# Patient Record
Sex: Male | Born: 1960 | Hispanic: No | Marital: Married | Smoking: Never smoker
Health system: Southern US, Community
[De-identification: ages and names within clinical notes are randomized; demographics above are authoritative.]

## PROBLEM LIST (undated history)

## (undated) DIAGNOSIS — J302 Other seasonal allergic rhinitis: Secondary | ICD-10-CM

## (undated) DIAGNOSIS — E781 Pure hyperglyceridemia: Secondary | ICD-10-CM

## (undated) DIAGNOSIS — M25551 Pain in right hip: Secondary | ICD-10-CM

## (undated) DIAGNOSIS — M545 Low back pain: Secondary | ICD-10-CM

## (undated) DIAGNOSIS — I10 Essential (primary) hypertension: Secondary | ICD-10-CM

## (undated) DIAGNOSIS — G8929 Other chronic pain: Secondary | ICD-10-CM

## (undated) DIAGNOSIS — K219 Gastro-esophageal reflux disease without esophagitis: Secondary | ICD-10-CM

## (undated) HISTORY — DX: Other chronic pain: G89.29

## (undated) HISTORY — DX: Low back pain: M54.5

## (undated) HISTORY — DX: Other seasonal allergic rhinitis: J30.2

## (undated) HISTORY — DX: Pain in right hip: M25.551

## (undated) HISTORY — DX: Essential (primary) hypertension: I10

## (undated) HISTORY — DX: Pure hyperglyceridemia: E78.1

## (undated) HISTORY — PX: ESOPHAGOGASTRODUODENOSCOPY: SHX1529

## (undated) HISTORY — DX: Gastro-esophageal reflux disease without esophagitis: K21.9

## (undated) HISTORY — PX: NO PAST SURGERIES: SHX2092

---

## 1998-11-22 ENCOUNTER — Emergency Department (HOSPITAL_COMMUNITY): Admission: EM | Admit: 1998-11-22 | Discharge: 1998-11-22 | Payer: Self-pay | Admitting: Emergency Medicine

## 1998-11-22 ENCOUNTER — Encounter: Payer: Self-pay | Admitting: Emergency Medicine

## 2017-07-09 ENCOUNTER — Encounter: Payer: Self-pay | Admitting: Physician Assistant

## 2017-07-09 ENCOUNTER — Ambulatory Visit (INDEPENDENT_AMBULATORY_CARE_PROVIDER_SITE_OTHER): Payer: 59 | Admitting: Physician Assistant

## 2017-07-09 ENCOUNTER — Encounter (INDEPENDENT_AMBULATORY_CARE_PROVIDER_SITE_OTHER): Payer: Self-pay

## 2017-07-09 VITALS — BP 139/89 | HR 66 | Ht 66.0 in | Wt 243.0 lb

## 2017-07-09 DIAGNOSIS — Z13220 Encounter for screening for lipoid disorders: Secondary | ICD-10-CM

## 2017-07-09 DIAGNOSIS — Z1159 Encounter for screening for other viral diseases: Secondary | ICD-10-CM | POA: Diagnosis not present

## 2017-07-09 DIAGNOSIS — Z131 Encounter for screening for diabetes mellitus: Secondary | ICD-10-CM | POA: Diagnosis not present

## 2017-07-09 DIAGNOSIS — Z7689 Persons encountering health services in other specified circumstances: Secondary | ICD-10-CM

## 2017-07-09 DIAGNOSIS — Z130 Encounter for screening for diseases of the blood and blood-forming organs and certain disorders involving the immune mechanism: Secondary | ICD-10-CM

## 2017-07-09 DIAGNOSIS — Z23 Encounter for immunization: Secondary | ICD-10-CM

## 2017-07-09 DIAGNOSIS — K219 Gastro-esophageal reflux disease without esophagitis: Secondary | ICD-10-CM

## 2017-07-09 DIAGNOSIS — E66812 Obesity, class 2: Secondary | ICD-10-CM

## 2017-07-09 DIAGNOSIS — E6609 Other obesity due to excess calories: Secondary | ICD-10-CM

## 2017-07-09 DIAGNOSIS — I10 Essential (primary) hypertension: Secondary | ICD-10-CM | POA: Diagnosis not present

## 2017-07-09 MED ORDER — FAMOTIDINE 20 MG PO TABS: 20.0000 mg | ORAL_TABLET | Freq: Two times a day (BID) | ORAL | 1 refills | Status: DC | PRN

## 2017-07-09 MED ORDER — ASPIRIN EC 81 MG PO TBEC: 81.0000 mg | DELAYED_RELEASE_TABLET | Freq: Every day | ORAL | 3 refills | Status: AC
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
## 2017-07-09 NOTE — Patient Instructions (Addendum)
For your blood pressure: - Goal <130/80 - baby aspirin 81 mg daily to help prevent heart attack/stroke - monitor and log blood pressures at home - check around the same time each day in a relaxed setting - Limit salt to <2000 mg/day - Follow DASH eating plan - limit alcohol to 2 standard drinks per day for men and 1 per day for women - avoid tobacco products - weight loss: 7% of current body weight - follow-up every 6 months for your blood pressure   Physical Activity Recommendations for modifying lipids and lowering blood pressure Engage in aerobic physical activity to reduce LDL-cholesterol, non-HDL-cholesterol, and blood pressure  Frequency: 3-4 sessions per week  Intensity: moderate to vigorous  Duration: 40 minutes on average  Physical Activity Recommendations for secondary prevention 1. Aerobic exercise  Frequency: 3-5 sessions per week  Intensity: 50-80% capacity  Duration: 20 - 60 minutes  Examples: walking, treadmill, cycling, rowing, stair climbing, and arm/leg ergometry  2. Resistance exercise  Frequency: 2-3 sessions per week  Intensity: 10-15 repetitions/set to moderate fatigue  Duration: 1-3 sets of 8-10 upper and lower body exercises  Examples: calisthenics, elastic bands, cuff/hand weights, dumbbels, free weights, wall pulleys, and weight machines  Heart-Healthy Lifestyle  Eating a diet rich in vegetables, fruits and whole grains: also includes low-fat dairy products, poultry, fish, legumes, and nuts; limit intake of sweets, sugar-sweetened beverages and red meats  Getting regular exercise  Maintaining a healthy weight  Not smoking or getting help quitting  Staying on top of your health; for some people, lifestyle changes alone may not be enough to prevent a heart attack or stroke. In these cases, taking a statin at the right dose will most likely be necessary   Food Choices for Gastroesophageal Reflux Disease, Adult When you have  gastroesophageal reflux disease (GERD), the foods you eat and your eating habits are very important. Choosing the right foods can help ease the discomfort of GERD. Consider working with a diet and nutrition specialist (dietitian) to help you make healthy food choices. What general guidelines should I follow? Eating plan  Choose healthy foods low in fat, such as fruits, vegetables, whole grains, low-fat dairy products, and lean meat, fish, and poultry.  Eat frequent, small meals instead of three large meals each day. Eat your meals slowly, in a relaxed setting. Avoid bending over or lying down until 2-3 hours after eating.  Limit high-fat foods such as fatty meats or fried foods.  Limit your intake of oils, butter, and shortening to less than 8 teaspoons each day.  Avoid the following: ? Foods that cause symptoms. These may be different for different people. Keep a food diary to keep track of foods that cause symptoms. ? Alcohol. ? Drinking large amounts of liquid with meals. ? Eating meals during the 2-3 hours before bed.  Cook foods using methods other than frying. This may include baking, grilling, or broiling. Lifestyle   Maintain a healthy weight. Ask your health care provider what weight is healthy for you. If you need to lose weight, work with your health care provider to do so safely.  Exercise for at least 30 minutes on 5 or more days each week, or as told by your health care provider.  Avoid wearing clothes that fit tightly around your waist and chest.  Do not use any products that contain nicotine or tobacco, such as cigarettes and e-cigarettes. If you need help quitting, ask your health care provider.  Sleep with the head  of your bed raised. Use a wedge under the mattress or blocks under the bed frame to raise the head of the bed. What foods are not recommended? The items listed may not be a complete list. Talk with your dietitian about what dietary choices are best for  you. Grains Pastries or quick breads with added fat. [place] toast. Vegetables Deep fried vegetables. [place] fries. Any vegetables prepared with added fat. Any vegetables that cause symptoms. For some people this may include tomatoes and tomato products, chili peppers, onions and garlic, and horseradish. Fruits Any fruits prepared with added fat. Any fruits that cause symptoms. For some people this may include citrus fruits, such as oranges, grapefruit, pineapple, and lemons. Meats and other protein foods High-fat meats, such as fatty beef or pork, hot dogs, ribs, ham, sausage, salami and bacon. Fried meat or protein, including fried fish and fried chicken. Nuts and nut butters. Dairy Whole milk and chocolate milk. Sour cream. Cream. Ice cream. Cream cheese. Milk shakes. Beverages Coffee and tea, with or without caffeine. Carbonated beverages. Sodas. Energy drinks. Fruit juice made with acidic fruits (such as orange or grapefruit). Tomato juice. Alcoholic drinks. Fats and oils Butter. Margarine. Shortening. Ghee. Sweets and desserts Chocolate and cocoa. Donuts. Seasoning and other foods Pepper. Peppermint and spearmint. Any condiments, herbs, or seasonings that cause symptoms. For some people, this may include curry, hot sauce, or vinegar-based salad dressings. Summary  When you have gastroesophageal reflux disease (GERD), food and lifestyle choices are very important to help ease the discomfort of GERD.  Eat frequent, small meals instead of three large meals each day. Eat your meals slowly, in a relaxed setting. Avoid bending over or lying down until 2-3 hours after eating.  Limit high-fat foods such as fatty meat or fried foods. This information is not intended to replace advice given to you by your health care provider. Make sure you discuss any questions you have with your health care provider. Document Released: 05/12/2005 Document Revised: 05/13/2016 Document Reviewed:  05/13/2016 Elsevier Interactive Patient Education  [employer]2018 [employer]

## 2017-07-10 LAB — COMPREHENSIVE METABOLIC PANEL
AG Ratio: 2 (calc) (ref 1.0–2.5)
ALT: 32 U/L (ref 9–46)
AST: 19 U/L (ref 10–35)
Albumin: 4.8 g/dL (ref 3.6–5.1)
Alkaline phosphatase (APISO): 65 U/L (ref 40–115)
BUN: 18 mg/dL (ref 7–25)
CO2: 26 mmol/L (ref 20–32)
Calcium: 9.5 mg/dL (ref 8.6–10.3)
Chloride: 106 mmol/L (ref 98–110)
Creat: 1.31 mg/dL (ref 0.70–1.33)
Globulin: 2.4 g/dL (calc) (ref 1.9–3.7)
Glucose, Bld: 100 mg/dL — ABNORMAL HIGH (ref 65–99)
Potassium: 4.5 mmol/L (ref 3.5–5.3)
Sodium: 136 mmol/L (ref 135–146)
Total Bilirubin: 0.7 mg/dL (ref 0.2–1.2)
Total Protein: 7.2 g/dL (ref 6.1–8.1)

## 2017-07-10 LAB — CBC
HCT: 44.9 % (ref 38.5–50.0)
Hemoglobin: 15.4 g/dL (ref 13.2–17.1)
MCH: 30 pg (ref 27.0–33.0)
MCHC: 34.3 g/dL (ref 32.0–36.0)
MCV: 87.4 fL (ref 80.0–100.0)
MPV: 10.7 fL (ref 7.5–12.5)
Platelets: 226 10*3/uL (ref 140–400)
RBC: 5.14 10*6/uL (ref 4.20–5.80)
RDW: 12.8 % (ref 11.0–15.0)
WBC: 4.3 10*3/uL (ref 3.8–10.8)

## 2017-07-10 LAB — LIPID PANEL W/REFLEX DIRECT LDL
Cholesterol: 171 mg/dL (ref <200)
HDL: 36 mg/dL — ABNORMAL LOW (ref >40)
LDL Cholesterol (Calc): 107 mg/dL (calc) — ABNORMAL HIGH
Non-HDL Cholesterol (Calc): 135 mg/dL (calc) — ABNORMAL HIGH (ref <130)
Total CHOL/HDL Ratio: 4.8 (calc) (ref <5.0)
Triglycerides: 160 mg/dL — ABNORMAL HIGH (ref <150)

## 2017-07-10 LAB — HEPATITIS C ANTIBODY
Hepatitis C Ab: NONREACTIVE
SIGNAL TO CUT-OFF: 0.02 (ref <1.00)

## 2017-07-10 NOTE — Progress Notes (Signed)
Your labs look good - hepatitis c antibody is negative - glucose is on the high end of normal. Recommend follow-up A1c to rule out prediabetes - normal kidney function - cholesterol in a healthy range - normal blood counts

## 2017-08-06 ENCOUNTER — Encounter: Payer: Self-pay | Admitting: Physician Assistant

## 2017-08-06 ENCOUNTER — Ambulatory Visit (INDEPENDENT_AMBULATORY_CARE_PROVIDER_SITE_OTHER): Payer: 59

## 2017-08-06 ENCOUNTER — Ambulatory Visit (INDEPENDENT_AMBULATORY_CARE_PROVIDER_SITE_OTHER): Payer: 59 | Admitting: Physician Assistant

## 2017-08-06 VITALS — BP 141/82 | HR 60 | Wt 243.0 lb

## 2017-08-06 DIAGNOSIS — M25551 Pain in right hip: Secondary | ICD-10-CM | POA: Diagnosis not present

## 2017-08-06 DIAGNOSIS — M2578 Osteophyte, vertebrae: Secondary | ICD-10-CM | POA: Diagnosis not present

## 2017-08-06 DIAGNOSIS — Z0000 Encounter for general adult medical examination without abnormal findings: Secondary | ICD-10-CM

## 2017-08-06 DIAGNOSIS — R7301 Impaired fasting glucose: Secondary | ICD-10-CM | POA: Insufficient documentation

## 2017-08-06 DIAGNOSIS — I10 Essential (primary) hypertension: Secondary | ICD-10-CM | POA: Diagnosis not present

## 2017-08-06 DIAGNOSIS — M1611 Unilateral primary osteoarthritis, right hip: Secondary | ICD-10-CM | POA: Insufficient documentation

## 2017-08-06 DIAGNOSIS — M479 Spondylosis, unspecified: Secondary | ICD-10-CM | POA: Insufficient documentation

## 2017-08-06 DIAGNOSIS — G8929 Other chronic pain: Secondary | ICD-10-CM

## 2017-08-06 DIAGNOSIS — M545 Low back pain, unspecified: Secondary | ICD-10-CM

## 2017-08-06 DIAGNOSIS — N200 Calculus of kidney: Secondary | ICD-10-CM | POA: Diagnosis not present

## 2017-08-06 DIAGNOSIS — E781 Pure hyperglyceridemia: Secondary | ICD-10-CM

## 2017-08-06 DIAGNOSIS — M47815 Spondylosis without myelopathy or radiculopathy, thoracolumbar region: Secondary | ICD-10-CM | POA: Insufficient documentation

## 2017-08-06 HISTORY — DX: Other chronic pain: G89.29

## 2017-08-06 HISTORY — DX: Pure hyperglyceridemia: E78.1

## 2017-08-06 HISTORY — DX: Low back pain, unspecified: M54.50

## 2017-08-06 MED ORDER — ACETAMINOPHEN ER 650 MG PO TBCR: 650.0000 mg | EXTENDED_RELEASE_TABLET | Freq: Three times a day (TID) | ORAL | 3 refills | Status: DC | PRN

## 2017-08-06 NOTE — Progress Notes (Signed)
Nadara EatonHi Abdoul,  Your x-rays look good. 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  Treatment plan does not change.  Thanks for coming in today! Vinetta Bergamoharley

## 2017-08-06 NOTE — Patient Instructions (Addendum)
For your blood pressure: - Goal <130/80 - baby aspirin 81 mg daily to help prevent heart attack/stroke - monitor and log blood pressures at home - check around the same time each day in a relaxed setting - Limit salt to <2000 mg/day - Follow DASH eating plan - limit alcohol to 2 standard drinks per day for men and 1 per day for women - avoid tobacco products - weight loss: 7% of current body weight - follow-up every 6 months for your blood pressure   For weight loss: - 1800 calorie diet for aggressive weight loss, 2000 calorie diet for less aggressive - MEASURE YOUR CALORIES (If you don't measure, you don't know if you are over-eating) - 100 grams of protein - limit carbs to 45 grams per meal, 20 g per snack - eating plans that are good options for people with high blood pressure are DASH, Mediterranean, and ADA (diabetes) - drink 11 glasses of water per day - limit caffeine to 1 cup per day - watch your sugar!  - no soda, sweet tea, juice, energy drinks, gatorade, or sweetened beverages  Physical Activity Recommendations for modifying lipids and lowering blood pressure Engage in aerobic physical activity to reduce LDL-cholesterol, non-HDL-cholesterol, and blood pressure  Frequency: 3-5 sessions per week  Intensity: moderate to vigorous  Duration: 40 minutes on average   1. Aerobic exercise  Frequency: 3-5 sessions per week  Intensity: 50-80% capacity  Duration: 20 - 60 minutes  Examples: walking, treadmill, cycling, rowing, stair climbing, and arm/leg ergometry  2. Resistance exercise  Frequency: 2-3 sessions per week  Intensity: 10-15 repetitions/set to moderate fatigue  Duration: 1-3 sets of 8-10 upper and lower body exercises  Examples: calisthenics, elastic bands, cuff/hand weights, dumbbels, free weights, wall pulleys, and weight machines  Heart-Healthy Lifestyle  Eating a diet rich in vegetables, fruits and whole grains: also includes low-fat dairy  products, poultry, fish, legumes, and nuts; limit intake of sweets, sugar-sweetened beverages and red meats  Getting regular exercise  Maintaining a healthy weight  Not smoking or getting help quitting  Staying on top of your health; for some people, lifestyle changes alone may not be enough to prevent a heart attack or stroke. In these cases, taking a statin at the right dose will most likely be necessary
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
## 2017-08-20 ENCOUNTER — Encounter: Payer: Self-pay | Admitting: Physical Therapy

## 2017-08-20 ENCOUNTER — Ambulatory Visit: Payer: 59 | Admitting: Physical Therapy

## 2017-08-20 DIAGNOSIS — M25551 Pain in right hip: Secondary | ICD-10-CM

## 2017-08-20 DIAGNOSIS — M6281 Muscle weakness (generalized): Secondary | ICD-10-CM | POA: Diagnosis not present

## 2017-08-20 DIAGNOSIS — M62830 Muscle spasm of back: Secondary | ICD-10-CM

## 2017-08-20 DIAGNOSIS — G8929 Other chronic pain: Secondary | ICD-10-CM

## 2017-08-20 DIAGNOSIS — M5441 Lumbago with sciatica, right side: Secondary | ICD-10-CM | POA: Diagnosis not present

## 2017-08-20 NOTE — Patient Instructions (Addendum)
Pelvic Press    Place hands under belly between navel and pubic bone, palms up. Feel pressure on hands. Increase pressure on hands by pressing pelvis down. This is NOT a pelvic tilt. Hold __5_ seconds. Relax. Repeat _10__ times. Perform once a day.   PRONE KNEE BEND:    Lie on stomach. Perform pelvic press, bend both knees. Do _2__ sets of _10__ repetitions per session. Do __1_ sessions per day.  Leg Lift: One-Leg    Press pelvis down. Keep knee straight; lengthen and lift one leg (from waist). Do not twist body. Keep other leg down. Hold _1__ seconds. Relax. Repeat 10 times. Repeat with other leg. Once a day.

## 2017-08-20 NOTE — Therapy (Signed)
[facility] [place] [address] [place], [place], [address] Phone: [phone]   Fax:  [phone]  Physical Therapy Evaluation  Patient Details  Name: Adrian [name] MRN: [id number] Date of Birth: 15-Mar-1961 Referring Provider: Gena Fray PA   Encounter Date: 08/20/2017  PT End of Session - 08/20/17 1017    Visit Number  1    Number of Visits  8    Date for PT Re-Evaluation  09/17/17    PT Start Time  1017    PT Stop Time  1112    PT Time Calculation (min)  55 min    Activity Tolerance  Patient tolerated treatment well       Past Medical History:  Diagnosis Date  . Chronic right hip pain 08/06/2017  . Chronic right-sided low back pain without sciatica 08/06/2017  . GERD (gastroesophageal reflux disease)   . Hypertension   . Hypertriglyceridemia 08/06/2017  . Seasonal allergies     Past Surgical History:  Procedure Laterality Date  . ESOPHAGOGASTRODUODENOSCOPY    . NO PAST SURGERIES      There were no vitals filed for this visit.   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 This cleared up.  In 2013 he [place] with his sons. Within the first week he bagan developing Rt hip pain ( was carrying 40# pack) that progressed into the groin. The pain is intermittent since this especailly with strenous activity. Currently not in an active flare up however had slight pain after pushing lawn mover and working in yard    How long can you sit comfortably?  no limitations     Diagnostic tests  x-rays (-)     Patient Stated Goals  not sure - hoping to get where he doesn't have a back issue     Currently in Pain?  Yes    Pain Score  2     Pain Location  Back    Pain Orientation  Right    Pain Descriptors / Indicators  Aching;Dull    Pain Type   Chronic pain    Pain Onset  In the past 7 days    Pain Frequency  Intermittent    Aggravating Factors   strenous activity    Pain Relieving Factors  rest, OTC meds         [facility] PT Assessment - 08/20/17 0001      Assessment   Medical Diagnosis  LBP & Rt hip pain    Referring Provider  Gena Fray PA    Onset Date/Surgical Date  07/23/17    Hand Dominance  Right    Next MD Visit  11/07/17    Prior Therapy  none      Precautions   Precautions  None      Balance Screen   Has the patient fallen in the past 6 months  No      Home Environment   Living Environment  Private residence    Living Arrangements  Spouse/significant other    Home Access  Stairs to enter      Prior Function   Level of Independence  Independent    Vocation  Retired Emergency planning/management officer    Leisure  yard work, hiking, wants to ride his bike  Observation/Other Assessments   Focus on Therapeutic Outcomes (FOTO)   39% limited      Functional Tests   Functional tests  Squat;Single leg stance      Squat   Comments  WNL some bilat knee pain      Single Leg Stance   Comments  WNL      Posture/Postural Control   Posture/Postural Control  Postural limitations    Postural Limitations  Increased lumbar lordosis obesity      ROM / Strength   AROM / PROM / Strength  AROM;Strength      AROM   AROM Assessment Site  Lumbar;Hip    Right/Left Hip  -- WNL    Lumbar Flexion  to floor, Rt low back slightly higher than left    Lumbar Extension  WNL with Rt sided LBP    Lumbar - Right Rotation  WNL pain at endrange    Lumbar - Left Rotation  WNL      Strength   Strength Assessment Site  Hip;Knee;Ankle;Lumbar    Right/Left Hip  Right;Left    Right Hip Flexion  4+/5 slight pain    Right Hip Extension  5/5    Right Hip ABduction  4+/5    Left Hip Flexion  -- 5-/5    Left Hip Extension  4+/5    Left Hip ABduction  4+/5    Right/Left Knee  -- bilat WNL    Right/Left Ankle  -- bilat WNL    Lumbar Flexion   -- TA poor    Lumbar Extension  -- multifidi poor      Flexibility   Soft Tissue Assessment /Muscle Length  yes    Hamstrings  supine SLR Lt WNL, Rt WNL    Quadriceps  prone knee bend WNL bilat      Palpation   Spinal mobility  hypomobile in lumbar spine, pain with Rt UPA L4-2    Palpation comment  pain and tight in Rt gluts/piriformis, pain in lumbar paraspinals Rt,slight tenderness in Lt gluts      Special Tests   Other special tests  (-) lumbar and SIJ special tests              No data recorded  Objective measurements completed on examination: See above findings.      [facility] Adult PT Treatment/Exercise - 08/20/17 0001      Self-Care   Self-Care  Other Self-Care Comments    Other Self-Care Comments   TRP using a ball against a wall for low back and Rt buttocks.       Exercises   Exercises  Lumbar      Lumbar Exercises: Supine   Other Supine Lumbar Exercises  10 reps pilates table tops with VC for form      Lumbar Exercises: Prone   Other Prone Lumbar Exercises  10 reps pelvic press, press with knee flex/ext, press with hip ext. , difficulty performing press             PT Education - 08/20/17 1113    Education provided  Yes    Education Details  HEP    Person(s) Educated  Patient    Methods  Demonstration;Explanation;Handout    Comprehension  Verbal cues required;Returned demonstration;Verbalized understanding          PT Long Term Goals - 08/20/17 1117      PT LONG TERM GOAL #1   Title  I with advanced HEP and demo safe body mechanics  while simulated yard work ( 09/17/17)     Time  4    Period  Weeks    Status  New      PT LONG TERM GOAL #2   Title  improve bilat hip strength =/> 5-/5 to support body for hiking trip ( 09/17/17)     Time  4    Period  Weeks    Status  New      PT LONG TERM GOAL #3   Title  improve FOTO =/< 25% limited  ( 09/17/17)     Time  4    Period  Weeks    Status  New      PT LONG TERM GOAL #4   Title   tolerate moderate level core work without increased in back pain and good form ( 09/17/17)    Time  4    Period  Weeks    Status  New      PT LONG TERM GOAL #5   Title  report no more than 1/10 back pain with daily activities ( 09/17/17)     Time  4    Period  Weeks    Status  New             Plan - 08/20/17 1113    Clinical Impression Statement  57 yo male with intermittent LBP that moves into the Rt groin.  This flared up the other week after performing yard work.  He has tightness and tenderness into the Rt low back and buttocks, weakness in his core and hips, impaired body mechanics and pain that is interfering with function.  He plans on hiking 10 miles in the next couple months and doesn't want his pain to limit this.     Clinical Presentation  Stable    Clinical Decision Making  Low    Rehab Potential  Excellent    PT Frequency  2x / week    PT Duration  4 weeks    PT Treatment/Interventions  Moist Heat;Manual techniques;Dry needling;Taping;Patient/family education;Therapeutic activities;Ultrasound;Traction;Therapeutic exercise;Cryotherapy;Electrical Stimulation;Passive range of motion    PT Next Visit Plan  core stabilization, manual work to Rt low back and buttocks, possible DN and modalities PRN, instruct in body mechanics for yard work.     Consulted and Agree with Plan of Care  Patient       Patient will benefit from skilled therapeutic intervention in order to improve the following deficits and impairments:  Pain, Improper body mechanics, Increased muscle spasms, Hypomobility, Decreased strength, Obesity  Visit Diagnosis: Chronic right-sided low back pain with right-sided sciatica - Plan: PT plan of care cert/re-cert  Pain in right hip - Plan: PT plan of care cert/re-cert  Muscle weakness (generalized) - Plan: PT plan of care cert/re-cert  Muscle spasm of back - Plan: PT plan of care cert/re-cert     Problem List Patient Active Problem List   Diagnosis Date  Noted  . Fasting hyperglycemia 08/06/2017  . Chronic right hip pain 08/06/2017  . Chronic right-sided low back pain without sciatica 08/06/2017  . Hypertriglyceridemia 08/06/2017  . 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  . Gastroesophageal reflux disease without esophagitis 07/09/2017  . Hypertension goal BP (blood pressure) < 130/80 07/09/2017  . Class 2 obesity due to excess calories in adult 07/09/2017    Roderic ScarceSusan Timoty Bourke PT  08/20/2017, 11:21 AM  [facility] [place] [facility] [facility] [address] [place], [place], [address] Phone: [phone]   Fax:  [phone]  Name:  Adrian [name] MRN: [id number] Date of Birth: [birth year]

## 2017-08-24 ENCOUNTER — Ambulatory Visit: Payer: 59 | Admitting: Physical Therapy

## 2017-08-24 DIAGNOSIS — M6281 Muscle weakness (generalized): Secondary | ICD-10-CM

## 2017-08-24 DIAGNOSIS — M5441 Lumbago with sciatica, right side: Secondary | ICD-10-CM | POA: Diagnosis not present

## 2017-08-24 DIAGNOSIS — M25551 Pain in right hip: Secondary | ICD-10-CM

## 2017-08-24 DIAGNOSIS — G8929 Other chronic pain: Secondary | ICD-10-CM

## 2017-08-24 DIAGNOSIS — M62830 Muscle spasm of back: Secondary | ICD-10-CM

## 2017-08-24 NOTE — Patient Instructions (Addendum)
Sleeping on Back  Place pillow under knees. A pillow with cervical support and a roll around waist are also helpful. Copyright  VHI. All rights reserved.  Sleeping on Side Place pillow between knees. Use cervical support under neck and a roll around waist as needed. Copyright  VHI. All rights reserved.   Sleeping on Stomach   If this is the only desirable sleeping position, place pillow under lower legs, and under stomach or chest as needed.  Posture - Sitting   Sit upright, head facing forward. Try using a roll to support lower back. Keep shoulders relaxed, and avoid rounded back. Keep hips level with knees. Avoid crossing legs for long periods. Stand to Sit / Sit to Stand   To sit: Bend knees to lower self onto front edge of chair, then scoot back on seat. To stand: Reverse sequence by placing one foot forward, and scoot to front of seat. Use rocking motion to stand up.   Work Height and Reach  Ideal work height is no more than 2 to 4 inches below elbow level when standing, and at elbow level when sitting. Reaching should be limited to arm's length, with elbows slightly bent.  Bending  Bend at hips and knees, not back. Keep feet shoulder-width apart.    Posture - Standing   Good posture is important. Avoid slouching and forward head thrust. Maintain curve in low back and align ears over shoul- ders, hips over ankles.  Alternating Positions   Alternate tasks and change positions frequently to reduce fatigue and muscle tension. Take rest breaks. Computer Work   Position work to face forward. Use proper work and seat height. Keep shoulders back and down, wrists straight, and elbows at right angles. Use chair that provides full back support. Add footrest and lumbar roll as needed.  Getting Into / Out of Car  Lower self onto seat, scoot back, then bring in one leg at a time. Reverse sequence to get out.  Dressing  Lie on back to pull socks or slacks over feet, or sit  and bend leg while keeping back straight.    Housework - Sink  Place one foot on ledge of cabinet under sink when standing at sink for prolonged periods.   Pushing / Pulling  Pushing is preferable to pulling. Keep back in proper alignment, and use leg muscles to do the work.  Deep Squat   Squat and lift with both arms held against upper trunk. Tighten stomach muscles without holding breath. Use smooth movements to avoid jerking.  Avoid Twisting   Avoid twisting or bending back. Pivot around using foot movements, and bend at knees if needed when reaching for articles.  Carrying Luggage   Distribute weight evenly on both sides. Use a cart whenever possible. Do not twist trunk. Move body as a unit.   Lifting Principles .Maintain proper posture and head alignment. .Slide object as close as possible before lifting. .Move obstacles out of the way. .Test before lifting; ask for help if too heavy. .Tighten stomach muscles without holding breath. .Use smooth movements; do not jerk. .Use legs to do the work, and pivot with feet. .Distribute the work load symmetrically and close to the center of trunk. .Push instead of pull whenever possible.   Ask For Help   Ask for help and delegate to others when possible. Coordinate your movements when lifting together, and maintain the low back curve.  Log Roll   Lying on back, bend left knee and place left   arm across chest. Roll all in one movement to the right. Reverse to roll to the left. Always move as one unit. Housework - Sweeping  Use long-handled equipment to avoid stooping.   Housework - Wiping  Position yourself as close as possible to reach work surface. Avoid straining your back.  Laundry - Unloading Wash   To unload small items at bottom of washer, lift leg opposite to arm being used to reach.  [place] close to area to be raked. Use arm movements to do the work. Keep back straight and avoid  twisting.     Cart  When reaching into cart with one arm, lift opposite leg to keep back straight.   Getting Into / Out of Bed  Lower self to lie down on one side by raising legs and lowering head at the same time. Use arms to assist moving without twisting. Bend both knees to roll onto back if desired. To sit up, start from lying on side, and use same move-ments in reverse. Housework - Vacuuming  Hold the vacuum with arm held at side. Step back and forth to move it, keeping head up. Avoid twisting.   Laundry - IT consultant so that bending and twisting can be avoided.   Laundry - Unloading Dryer  Squat down to reach into clothes dryer or use a reacher.  Gardening - Weeding / Probation officer or Kneel. Knee pads may be helpful.                   TENS stands for Transcutaneous Electrical Nerve Stimulation. In other words, electrical impulses are allowed to pass through the skin in order to excite a nerve.   Purpose and Use of TENS:  TENS is a method used to manage acute and chronic pain without the use of drugs. It has been effective in managing pain associated with surgery, sprains, strains, trauma, rheumatoid arthritis, and neuralgias. It is a non-addictive, low risk, and non-invasive technique used to control pain. It is not, by any means, a curative form of treatment.   How TENS Works:  Most TENS units are a Paramedic unit powered by one 9 volt battery. Attached to the outside of the unit are two lead wires where two pins and/or snaps connect on each wire. All units come with a set of four reusable pads or electrodes. These are placed on the skin surrounding the area involved. By inserting the leads into  the pads, the electricity can pass from the unit making the circuit complete.  As the intensity is turned up slowly, the electrical current enters the body from the electrodes through the skin to the surrounding nerve fibers. This  triggers the release of hormones from within the body. These hormones contain pain relievers. By increasing the circulation of these hormones, the person's pain may be lessened. It is also believed that the electrical stimulation itself helps to block the pain messages being sent to the brain, thus also decreasing the body's perception of pain.   Hazards:  TENS units are NOT to be used by patients with PACEMAKERS, DEFIBRILLATORS, DIABETIC PUMPS, PREGNANT WOMEN, and patients with SEIZURE DISORDERS.  TENS units are NOT to be used over the heart, throat, brain, or spinal cord.  One of the major side effects from the TENS unit may be skin irritation. Some people may develop a rash if they are sensitive to the materials used in the electrodes or the  connecting wires.    Avoid overuse due the body getting used to the stem making it not as effective over time.    [facility] [address] [place], [place] [address]  [phone] (office) [phone] (fax)

## 2017-08-24 NOTE — Therapy (Signed)
[facility] [address] [place] [address] [place], [place], [address] Phone: [phone]   Fax:  [phone]  Physical Therapy Treatment  Patient Details  Name: Adrian [name] MRN: [id number] Date of Birth: 14-Jun-1960 Referring Provider: Gena Frayharley Cummings, PA-C   Encounter Date: 08/24/2017  PT End of Session - 08/24/17 0939    Visit Number  2    Number of Visits  8    Date for PT Re-Evaluation  09/17/17    PT Start Time  0936    PT Stop Time  1034    PT Time Calculation (min)  58 min       Past Medical History:  Diagnosis Date  . Chronic right hip pain 08/06/2017  . Chronic right-sided low back pain without sciatica 08/06/2017  . GERD (gastroesophageal reflux disease)   . Hypertension   . Hypertriglyceridemia 08/06/2017  . Seasonal allergies     Past Surgical History:  Procedure Laterality Date  . ESOPHAGOGASTRODUODENOSCOPY    . NO PAST SURGERIES      There were no vitals filed for this visit.  Subjective Assessment - 08/24/17 0939    Subjective  Pt reports he had difficulty finding the right size ball for self massage and he is wondering if he is doing his exercises correctly.     Currently in Pain?  Yes    Pain Score  1     Pain Location  Back    Pain Orientation  Right;Lower    Aggravating Factors   strenous activity; bending    Pain Relieving Factors  rest, OTC meds          [facility] PT Assessment - 08/24/17 0001      Assessment   Medical Diagnosis  LBP & Rt hip pain    Referring Provider  Gena Frayharley Cummings, PA-C    Onset Date/Surgical Date  07/23/17    Hand Dominance  Right    Next MD Visit  11/07/17      Palpation   SI assessment   Rt ilium higher than Lt; Rt ASIS lower than Lt;     Palpation comment  point tender in Rt iliopsoas in supine        [facility] Adult PT Treatment/Exercise - 08/24/17 0001      Lumbar Exercises: Aerobic   Nustep  L5: 7 min  PTA present to discuss progress      Lumbar Exercises:  Seated   Other Seated Lumbar Exercises  pt educated on sit to/from supine via log roll; pt returned demo with VC.        Lumbar Exercises: Supine   Other Supine Lumbar Exercises  10 reps pilates single leg table tops, 5 sec hold in flexion, with VC for form       Lumbar Exercises: Prone   Other Prone Lumbar Exercises  10 reps pelvic press, press with knee flex/ext x 10 reps, then bilat knee bend x 10 reps       Modalities   Modalities  Electrical Stimulation;Moist Heat      Moist Heat Therapy   Number Minutes Moist Heat  15 Minutes    Moist Heat Location  Lumbar Spine;Hip      Electrical Stimulation   Electrical Stimulation Location  Rt hip and low back     Electrical Stimulation Action  IFC    Electrical Stimulation Parameters   to tolerance    Electrical Stimulation Goals  Pain;Tone      Manual Therapy  Manual Therapy  Soft tissue mobilization    Manual therapy comments  pt in prone; guarded.     Soft tissue mobilization  STM to Rt glute and hip rotators (with and without PROM into ER/IR of LE)             PT Education - 08/24/17 1022    Education provided  Yes    Education Details  posture and body mechanics, TENS    Person(s) Educated  Patient    Methods  Handout;Explanation    Comprehension  Verbalized understanding          PT Long Term Goals - 08/20/17 1117      PT LONG TERM GOAL #1   Title  I with advanced HEP and demo safe body mechanics while simulated yard work ( 09/17/17)     Time  4    Period  Weeks    Status  New      PT LONG TERM GOAL #2   Title  improve bilat hip strength =/> 5-/5 to support body for hiking trip ( 09/17/17)     Time  4    Period  Weeks    Status  New      PT LONG TERM GOAL #3   Title  improve FOTO =/< 25% limited  ( 09/17/17)     Time  4    Period  Weeks    Status  New      PT LONG TERM GOAL #4   Title  tolerate moderate level core work without increased in back pain and good form ( 09/17/17)    Time  4    Period   Weeks    Status  New      PT LONG TERM GOAL #5   Title  report no more than 1/10 back pain with daily activities ( 09/17/17)     Time  4    Period  Weeks    Status  New            Plan - 08/24/17 1000    Clinical Impression Statement  Pt required some cues for form and breathe work with exercises.  Pt given beginning education on body mechanics and log roll for neutral spine. Improved ability to complete pelvic press with minimal cues.  Pt presents with pelvis asymmetries; will benefit from further evaluation of this in future.  Pt reported reduction of pain after estim/MHP.      Rehab Potential  Excellent    PT Frequency  2x / week    PT Duration  4 weeks    PT Treatment/Interventions  Moist Heat;Manual techniques;Dry needling;Taping;Patient/family education;Therapeutic activities;Ultrasound;Traction;Therapeutic exercise;Cryotherapy;Electrical Stimulation;Passive range of motion    PT Next Visit Plan  core stabilization, manual work to Rt low back and buttocks, possible DN and modalities PRN, instruct in body mechanics for yard work.     Consulted and Agree with Plan of Care  Patient       Patient will benefit from skilled therapeutic intervention in order to improve the following deficits and impairments:  Pain, Improper body mechanics, Increased muscle spasms, Hypomobility, Decreased strength, Obesity  Visit Diagnosis: Chronic right-sided low back pain with right-sided sciatica  Pain in right hip  Muscle weakness (generalized)  Muscle spasm of back     Problem List Patient Active Problem List   Diagnosis Date Noted  . Fasting hyperglycemia 08/06/2017  . Chronic right hip pain 08/06/2017  . Chronic right-sided low back pain without sciatica  08/06/2017  . Hypertriglyceridemia 08/06/2017  . 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  . Gastroesophageal reflux disease without esophagitis 07/09/2017  . Hypertension goal BP (blood pressure) < 130/80 07/09/2017  . Class 2 obesity  due to excess calories in adult 07/09/2017   Mayer Camel, PTA 08/24/17 1:34 PM  [facility] [place] [address] [place], [place], [address] Phone: [phone]   Fax:  [phone]  Name: Adrian [name] MRN: [id number] Date of Birth: 1960-06-28

## 2017-08-26 ENCOUNTER — Encounter: Payer: Self-pay | Admitting: Physical Therapy

## 2017-08-26 ENCOUNTER — Ambulatory Visit: Payer: 59 | Admitting: Physical Therapy

## 2017-08-26 DIAGNOSIS — M6281 Muscle weakness (generalized): Secondary | ICD-10-CM | POA: Diagnosis not present

## 2017-08-26 DIAGNOSIS — M5441 Lumbago with sciatica, right side: Secondary | ICD-10-CM

## 2017-08-26 DIAGNOSIS — G8929 Other chronic pain: Secondary | ICD-10-CM

## 2017-08-26 DIAGNOSIS — M25551 Pain in right hip: Secondary | ICD-10-CM

## 2017-08-26 DIAGNOSIS — M62830 Muscle spasm of back: Secondary | ICD-10-CM | POA: Diagnosis not present

## 2017-08-26 NOTE — Patient Instructions (Signed)

## 2017-08-26 NOTE — Therapy (Signed)
[facility] [address] [place], [place], [address] Phone: [phone]   Fax:  [phone]  Physical Therapy Treatment  Patient Details  Name: Adrian [name] MRN: [id number] Date of Birth: Oct 26, 1960 Referring Provider: Gena Fray, PA-C   Encounter Date: 08/26/2017  PT End of Session - 08/26/17 1434    Visit Number  3    Number of Visits  8    Date for PT Re-Evaluation  09/17/17    PT Start Time  1435    PT Stop Time  1535    PT Time Calculation (min)  60 min    Activity Tolerance  Patient tolerated treatment well       Past Medical History:  Diagnosis Date  . Chronic right hip pain 08/06/2017  . Chronic right-sided low back pain without sciatica 08/06/2017  . GERD (gastroesophageal reflux disease)   . Hypertension   . Hypertriglyceridemia 08/06/2017  . Seasonal allergies     Past Surgical History:  Procedure Laterality Date  . ESOPHAGOGASTRODUODENOSCOPY    . NO PAST SURGERIES      There were no vitals filed for this visit.  Subjective Assessment - 08/26/17 1435    Subjective  Pt reports that he is doing better with his HEP and found a couple balls to use. He thinks the knot is smaller if not gone or moved.  He feels like the pain moved into the center in the spine.     Currently in Pain?  Yes    Pain Score  2     Pain Location  Back    Pain Orientation  Right;Lower    Pain Descriptors / Indicators  Dull    Pain Type  Chronic pain    Pain Onset  1 to 4 weeks ago    Pain Frequency  Intermittent    Aggravating Factors   bending, increased activity.     Pain Relieving Factors  the exercise and TPR                       [facility] Adult PT Treatment/Exercise - 08/26/17 0001      Lumbar Exercises: Stretches   Single Knee to Chest Stretch  10 seconds    Double Knee to Chest Stretch  10 seconds      Lumbar Exercises: Aerobic   Nustep  L6: 6 min       Lumbar Exercises: Standing   Wall  Slides  -- mini, with small alternating leg lifts.     Other Standing Lumbar Exercises  marching with FWD reach holding 5#  last couple of reps against wall d/t back discomfort.       Lumbar Exercises: Supine   Bridge  10 reps    Bridge with clamshell  10 reps      Modalities   Modalities  Electrical Stimulation;Moist Heat      Moist Heat Therapy   Number Minutes Moist Heat  15 Minutes    Moist Heat Location  Lumbar Spine;Hip      Electrical Stimulation   Electrical Stimulation Location  Rt hip and low back     Electrical Stimulation Action  IFC    Electrical Stimulation Parameters  to toleracne    Electrical Stimulation Goals  Pain;Tone      Manual Therapy   Manual Therapy  Soft tissue mobilization    Soft tissue mobilization  STM to Rt glute and hip rotators  Trigger Point Dry Needling - 08/26/17 1454    Consent Given?  Yes    Education Handout Provided  Yes    Muscles Treated Upper Body  Longissimus    Muscles Treated Lower Body  Gluteus minimus;Gluteus maximus;Piriformis    Longissimus Response  Palpable increased muscle length;Twitch response elicited Rt L4-3    Gluteus Maximus Response  Palpable increased muscle length;Twitch response elicited Rt    Gluteus Minimus Response  Twitch response elicited;Palpable increased muscle length Rt    Piriformis Response  Palpable increased muscle length;Twitch response elicited Rt           PT Education - 08/26/17 1455    Education provided  Yes    Education Details  DN    Methods  Explanation;Handout    Comprehension  Verbalized understanding          PT Long Term Goals - 08/20/17 1117      PT LONG TERM GOAL #1   Title  I with advanced HEP and demo safe body mechanics while simulated yard work ( 09/17/17)     Time  4    Period  Weeks    Status  New      PT LONG TERM GOAL #2   Title  improve bilat hip strength =/> 5-/5 to support body for hiking trip ( 09/17/17)     Time  4    Period  Weeks    Status  New       PT LONG TERM GOAL #3   Title  improve FOTO =/< 25% limited  ( 09/17/17)     Time  4    Period  Weeks    Status  New      PT LONG TERM GOAL #4   Title  tolerate moderate level core work without increased in back pain and good form ( 09/17/17)    Time  4    Period  Weeks    Status  New      PT LONG TERM GOAL #5   Title  report no more than 1/10 back pain with daily activities ( 09/17/17)     Time  4    Period  Weeks    Status  New            Plan - 08/26/17 1518    Clinical Impression Statement  Pt with good response to DN and manual work. Does have tightness in the rt gluts, piriformis, longisimus and QL.  Was able to needle and perform manual work on the first, if still having pain would benifit from tx to the Rt WL as well.  He is very weak in his core and needs continued strengthening.     Rehab Potential  Excellent    PT Frequency  2x / week    PT Duration  4 weeks    PT Treatment/Interventions  Moist Heat;Manual techniques;Dry needling;Taping;Patient/family education;Therapeutic activities;Ultrasound;Traction;Therapeutic exercise;Cryotherapy;Electrical Stimulation;Passive range of motion    PT Next Visit Plan  assess response to manual work /DN, work on [employer] and gluts more if needed, core stabiliization and modalities PRN. Cont to reinforce safe body mechanics    Consulted and Agree with Plan of Care  Patient       Patient will benefit from skilled therapeutic intervention in order to improve the following deficits and impairments:  Pain, Improper body mechanics, Increased muscle spasms, Hypomobility, Decreased strength, Obesity  Visit Diagnosis: Chronic right-sided low back pain with right-sided sciatica  Pain in right  hip  Muscle weakness (generalized)  Muscle spasm of back     Problem List Patient Active Problem List   Diagnosis Date Noted  . Fasting hyperglycemia 08/06/2017  . Chronic right hip pain 08/06/2017  . Chronic right-sided low back pain  without sciatica 08/06/2017  . Hypertriglyceridemia 08/06/2017  . 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  . Gastroesophageal reflux disease without esophagitis 07/09/2017  . Hypertension goal BP (blood pressure) < 130/80 07/09/2017  . Class 2 obesity due to excess calories in adult 07/09/2017    [name] PT  08/26/2017, 3:21 PM  [facility] [address] [place] [place], [place], [address] Phone: [phone]   Fax:  [phone]  Name: YAFET CLINE MRN: [id number] Date of Birth: March 02, 1961

## 2017-08-31 ENCOUNTER — Ambulatory Visit: Payer: 59 | Admitting: Physical Therapy

## 2017-08-31 DIAGNOSIS — M25551 Pain in right hip: Secondary | ICD-10-CM

## 2017-08-31 DIAGNOSIS — M6281 Muscle weakness (generalized): Secondary | ICD-10-CM

## 2017-08-31 DIAGNOSIS — M62830 Muscle spasm of back: Secondary | ICD-10-CM | POA: Diagnosis not present

## 2017-08-31 DIAGNOSIS — M5441 Lumbago with sciatica, right side: Secondary | ICD-10-CM

## 2017-08-31 DIAGNOSIS — G8929 Other chronic pain: Secondary | ICD-10-CM

## 2017-08-31 NOTE — Therapy (Signed)
[facility] [place] [address] [place] [place], [place], [address] Phone: [phone]   Fax:  [phone]  Physical Therapy Treatment  Patient Details  Name: Adrian [name] MRN: [id number] Date of Birth: Apr 30, 1961 Referring Provider: Gena Fray, PA-C   Encounter Date: 08/31/2017  PT End of Session - 08/31/17 1016    Visit Number  4    Number of Visits  8    Date for PT Re-Evaluation  09/17/17    PT Start Time  1016    PT Stop Time  1120    PT Time Calculation (min)  64 min    Activity Tolerance  Patient tolerated treatment well    Behavior During Therapy  [facility] for tasks assessed/performed       Past Medical History:  Diagnosis Date  . Chronic right hip pain 08/06/2017  . Chronic right-sided low back pain without sciatica 08/06/2017  . GERD (gastroesophageal reflux disease)   . Hypertension   . Hypertriglyceridemia 08/06/2017  . Seasonal allergies     Past Surgical History:  Procedure Laterality Date  . ESOPHAGOGASTRODUODENOSCOPY    . NO PAST SURGERIES      There were no vitals filed for this visit.  Subjective Assessment - 08/31/17 1020    Subjective  Regarding DN last session - "At first I felt like I had been punched.  I expected it to be worse, but it wasn't that bad". He was busy in his shop yesterday so he is sore everywhere, especially in his knees.     Patient Stated Goals  not sure - hoping to get where he doesn't have a back issue     Currently in Pain?  Yes    Pain Score  1     Pain Location  Back    Pain Orientation  Lower;Left    Pain Descriptors / Indicators  Dull;Aching    Aggravating Factors   bending    Pain Relieving Factors  standing still       [facility] Adult PT Treatment/Exercise - 08/31/17 0001      Lumbar Exercises: Stretches   Single Knee to Chest Stretch  Right;Left;2 reps;30 seconds    Quad Stretch  Right;Left;2 reps;30 seconds prone with strap      Lumbar Exercises: Supine   Bridge  10  reps;3 seconds      Lumbar Exercises: Prone   Opposite Arm/Leg Raise  Right arm/Left leg;Left arm/Right leg;10 reps - reported discomfort in neck from positioning.    Other Prone Lumbar Exercises  pelvic press x 5 sec x 3 reps; pelvic press with bilat knee flex/ext x 10; press with leg ext (knee straight) x 3 reps each leg - reported discomfort in Lt LB.       Lumbar Exercises: Quadruped   Madcat/Old Horse  5 reps;Limitations    Madcat/Old Horse Limitations  some discomfort with extension, limited to painfree range.       Moist Heat Therapy   Number Minutes Moist Heat  20 Minutes    Moist Heat Location  Lumbar Spine;Ant Rt Hip      Electrical Stimulation   Electrical Stimulation Location  bilat low back     Electrical Stimulation Action  IFC    Electrical Stimulation Parameters  to tolerance    Electrical Stimulation Goals  Pain;Tone      Manual Therapy   Soft tissue mobilization  attempted STM/ TPR to Rt iliacus; tender and guarded - stopped.STM to Lt / Rt glutes  in prone      SI assessment:  Ilium level in supine; ASIS level in supine; sacrum ~ level in prone.  Rt ASIS / iliacus tender to palpation.         PT Long Term Goals - 08/31/17 1055      PT LONG TERM GOAL #1   Title  I with advanced HEP and demo safe body mechanics while simulated yard work ( 09/17/17)     Time  4    Period  Weeks    Status  On-going      PT LONG TERM GOAL #2   Title  improve bilat hip strength =/> 5-/5 to support body for hiking trip ( 09/17/17)     Time  4    Period  Weeks    Status  On-going      PT LONG TERM GOAL #3   Title  improve FOTO =/< 25% limited  ( 09/17/17)     Time  4    Period  Weeks    Status  On-going      PT LONG TERM GOAL #4   Title  tolerate moderate level core work without increased in back pain and good form ( 09/17/17)    Time  4    Period  Weeks    Status  On-going      PT LONG TERM GOAL #5   Title  report no more than 1/10 back pain with daily activities (  09/17/17)     Time  4    Period  Weeks    Status  On-going improving            Plan - 08/31/17 1030    Clinical Impression Statement  Pt had good response to DN from last session. He had some Lt low back with Lt/Rt quad stretch; reduced with reducing the intensity of stretch. Pt remains tender and guarded in Rt ant hip musculature.  Pelvis alignment improved. Pt progressing towards goals.     Rehab Potential  Excellent    PT Frequency  2x / week    PT Duration  4 weeks    PT Treatment/Interventions  Moist Heat;Manual techniques;Dry needling;Taping;Patient/family education;Therapeutic activities;Ultrasound;Traction;Therapeutic exercise;Cryotherapy;Electrical Stimulation;Passive range of motion    PT Next Visit Plan  manual work /DN (iliacus?), work on QL and gluts more if needed, core stabiliization.        Patient will benefit from skilled therapeutic intervention in order to improve the following deficits and impairments:  Pain, Improper body mechanics, Increased muscle spasms, Hypomobility, Decreased strength, Obesity  Visit Diagnosis: Chronic right-sided low back pain with right-sided sciatica  Pain in right hip  Muscle weakness (generalized)  Muscle spasm of back     Problem List Patient Active Problem List   Diagnosis Date Noted  . Fasting hyperglycemia 08/06/2017  . Chronic right hip pain 08/06/2017  . Chronic right-sided low back pain without sciatica 08/06/2017  . Hypertriglyceridemia 08/06/2017  . 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  . Gastroesophageal reflux disease without esophagitis 07/09/2017  . Hypertension goal BP (blood pressure) < 130/80 07/09/2017  . Class 2 obesity due to excess calories in adult 07/09/2017   Mayer CamelJennifer Carlson-Long, PTA 08/31/17 1:35 PM  [facility] [place] [address] [place] [address] [place], [place], [address] Phone: [phone]   Fax:  [phone]  Name: Adrian [name] MRN:  [id number] Date of Birth: 15-Sep-1960

## 2017-09-03 ENCOUNTER — Encounter: Payer: Self-pay | Admitting: Physical Therapy

## 2017-09-03 ENCOUNTER — Ambulatory Visit (INDEPENDENT_AMBULATORY_CARE_PROVIDER_SITE_OTHER): Payer: 59 | Admitting: Physical Therapy

## 2017-09-03 DIAGNOSIS — M5441 Lumbago with sciatica, right side: Secondary | ICD-10-CM

## 2017-09-03 DIAGNOSIS — M62830 Muscle spasm of back: Secondary | ICD-10-CM

## 2017-09-03 DIAGNOSIS — M25551 Pain in right hip: Secondary | ICD-10-CM

## 2017-09-03 DIAGNOSIS — M6281 Muscle weakness (generalized): Secondary | ICD-10-CM

## 2017-09-03 DIAGNOSIS — G8929 Other chronic pain: Secondary | ICD-10-CM

## 2017-09-03 NOTE — Therapy (Signed)
Adrian [name], [place] [facility] [place] [address] [place], [place], [address] Phone: [phone]   Fax:  [phone]  Physical Therapy Treatment  Patient Details  Name: Adrian [name] MRN: [id number] Date of Birth: April 20, 1961 Referring Provider: Rosita Kea, PA-C   Encounter Date: 09/03/2017  PT End of Session - 09/03/17 1020    Visit Number  5    Number of Visits  8    Date for PT Re-Evaluation  09/17/17    PT Start Time  1020    PT Stop Time  1117    PT Time Calculation (min)  57 min    Activity Tolerance  Patient tolerated treatment well       Past Medical History:  Diagnosis Date  . Chronic right hip pain 08/06/2017  . Chronic right-sided low back pain without sciatica 08/06/2017  . GERD (gastroesophageal reflux disease)   . Hypertension   . Hypertriglyceridemia 08/06/2017  . Seasonal allergies     Past Surgical History:  Procedure Laterality Date  . ESOPHAGOGASTRODUODENOSCOPY    . NO PAST SURGERIES      There were no vitals filed for this visit.  Subjective Assessment - 09/03/17 1020    Subjective  Pt worked in yard yesterday, riding Surveyor, mining, 2-3 hrs, and doing pretty well today. Also worked in his yard, getting rid of weeds.     Patient Stated Goals  not sure - hoping to get where he doesn't have a back issue     Currently in Pain?  Yes    Pain Score  1     Pain Orientation  Right    Pain Descriptors / Indicators  Dull;Burning deep    Pain Type  Chronic pain    Pain Onset  More than a month ago    Pain Frequency  Intermittent    Aggravating Factors   bending FWD    Pain Relieving Factors  being still and resting                       [facility] Adult PT Treatment/Exercise - 09/03/17 0001      Lumbar Exercises: Stretches   Passive Hamstring Stretch  Right    Single Knee to Chest Stretch  Left;Right;1 rep developed Rt HS cramp      Lumbar Exercises: Aerobic   Nustep  L6: 6 min, legs only       Lumbar Exercises: Supine   Bridge  -- 3x10 figure 4 each side, Lt LBP with push through Rt LE      Modalities   Modalities  Electrical Stimulation;Moist Heat      Moist Heat Therapy   Number Minutes Moist Heat  20 Minutes    Moist Heat Location  Lumbar Spine hamstrings bilat      Electrical Stimulation   Electrical Stimulation Location  Rt buttock/HS & Lt HS    Electrical Stimulation Action  premod    Electrical Stimulation Parameters  to toleracne    Electrical Stimulation Goals  Pain;Tone      Manual Therapy   Manual Therapy  Soft tissue mobilization    Soft tissue mobilization  STM to Rt gluts with deep pressure to release the pirifomris, STM to bilat hamstrings.        Trigger Point Dry Needling - 09/03/17 1030    Consent Given?  Yes    Education Handout Provided  No    Muscles Treated Lower Body  Gluteus maximus;Gluteus minimus;Piriformis;Hamstring  Gluteus Maximus Response  Palpable increased muscle length;Twitch response elicited Rt     Gluteus Minimus Response  Palpable increased muscle length;Twitch response elicited Rt    Piriformis Response  Palpable increased muscle length;Twitch response elicited    Hamstring Response  Palpable increased muscle length;Twitch response elicited bilat Lt tighter than Rt                 PT Long Term Goals - 08/31/17 1055      PT LONG TERM GOAL #1   Title  I with advanced HEP and demo safe body mechanics while simulated yard work ( 09/17/17)     Time  4    Period  Weeks    Status  On-going      PT LONG TERM GOAL #2   Title  improve bilat hip strength =/> 5-/5 to support body for hiking trip ( 09/17/17)     Time  4    Period  Weeks    Status  On-going      PT LONG TERM GOAL #3   Title  improve FOTO =/< 25% limited  ( 09/17/17)     Time  4    Period  Weeks    Status  On-going      PT LONG TERM GOAL #4   Title  tolerate moderate level core work without increased in back pain and good form ( 09/17/17)    Time  4     Period  Weeks    Status  On-going      PT LONG TERM GOAL #5   Title  report no more than 1/10 back pain with daily activities ( 09/17/17)     Time  4    Period  Weeks    Status  On-going improving            Plan - 09/03/17 1100    Clinical Impression Statement  Malikye has less tightness in his low back and Rt gluts, there is still some tightness in the gluts though.  Bilat HS are very tight and could be restricting pelvic movement during activity.  Had good response to manual therapy today.     Rehab Potential  Excellent    PT Frequency  2x / week    PT Duration  4 weeks    PT Treatment/Interventions  Moist Heat;Manual techniques;Dry needling;Taping;Patient/family education;Therapeutic activities;Ultrasound;Traction;Therapeutic exercise;Cryotherapy;Electrical Stimulation;Passive range of motion    PT Next Visit Plan  manual work /DN (iliacus?), work on QL and gluts more if needed, core stabiliization.     Consulted and Agree with Plan of Care  Patient       Patient will benefit from skilled therapeutic intervention in order to improve the following deficits and impairments:  Pain, Improper body mechanics, Increased muscle spasms, Hypomobility, Decreased strength, Obesity  Visit Diagnosis: Chronic right-sided low back pain with right-sided sciatica  Pain in right hip  Muscle weakness (generalized)  Muscle spasm of back     Problem List Patient Active Problem List   Diagnosis Date Noted  . Fasting hyperglycemia 08/06/2017  . Chronic right hip pain 08/06/2017  . Chronic right-sided low back pain without sciatica 08/06/2017  . Hypertriglyceridemia 08/06/2017  . 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  . Gastroesophageal reflux disease without esophagitis 07/09/2017  . Hypertension goal BP (blood pressure) < 130/80 07/09/2017  . Class 2 obesity due to excess calories in adult 07/09/2017    Adrian [name] PT  09/03/2017, 11:02 AM  [facility]  [name] [address] [address] [place], [place], [address] Phone: [phone]   Fax:  [phone]  Name: Adrian [name] MRN: [id number] Date of Birth: [birth year]

## 2017-09-09 ENCOUNTER — Encounter: Payer: Self-pay | Admitting: Physical Therapy

## 2017-09-09 ENCOUNTER — Ambulatory Visit: Payer: 59 | Admitting: Physical Therapy

## 2017-09-09 DIAGNOSIS — M25551 Pain in right hip: Secondary | ICD-10-CM

## 2017-09-09 DIAGNOSIS — M62830 Muscle spasm of back: Secondary | ICD-10-CM

## 2017-09-09 DIAGNOSIS — G8929 Other chronic pain: Secondary | ICD-10-CM | POA: Diagnosis not present

## 2017-09-09 DIAGNOSIS — M5441 Lumbago with sciatica, right side: Secondary | ICD-10-CM

## 2017-09-09 DIAGNOSIS — M6281 Muscle weakness (generalized): Secondary | ICD-10-CM

## 2017-09-09 NOTE — Therapy (Addendum)
[place], [place], [address] Phone: [phone]   Fax:  [phone]  Physical Therapy Treatment  Patient Details  Name: Adrian [name] MRN: [id number] Date of Birth: 1961/02/05 Referring Provider: Nelson Chimes   Encounter Date: 09/09/2017  PT End of Session - 09/09/17 1504    Visit Number  6    Number of Visits  8    Date for PT Re-Evaluation  09/17/17    PT Start Time  1504    PT Stop Time  1553    PT Time Calculation (min)  49 min    Activity Tolerance  Patient tolerated treatment well       Past Medical History:  Diagnosis Date  . Chronic right hip pain 08/06/2017  . Chronic right-sided low back pain without sciatica 08/06/2017  . GERD (gastroesophageal reflux disease)   . Hypertension   . Hypertriglyceridemia 08/06/2017  . Seasonal allergies     Past Surgical History:  Procedure Laterality Date  . ESOPHAGOGASTRODUODENOSCOPY    . NO PAST SURGERIES      There were no vitals filed for this visit.  Subjective Assessment - 09/09/17 1505    Subjective  Pt reports the sensation from e-stim went away that evening. Recently he has been feeling much better even with extensive digging. He is sore and stiff after this way.     Patient Stated Goals  not sure - hoping to get where he doesn't have a back issue     Currently in Pain?  No/denies         [facility] PT Assessment - 09/09/17 0001      Assessment   Medical Diagnosis  LBP & Rt hip pain    Referring Provider  Nelson Chimes      Observation/Other Assessments   Focus on Therapeutic Outcomes (FOTO)   17% limited      AROM   Lumbar Flexion  to the floor    Lumbar Extension  WNL no pain    Lumbar - Right Rotation  WNL    Lumbar - Left Rotation  WNL      Strength   Right Hip Flexion  5/5    Right Hip Extension  5/5    Right Hip ABduction  5/5    Left Hip Flexion  5/5    Left Hip Extension  -- 5-/5    Left Hip ABduction   5/5                   [facility] Adult PT Treatment/Exercise - 09/09/17 0001      Lumbar Exercises: Stretches   Passive Hamstring Stretch  Right;Left;30 seconds supine with strap    Lower Trunk Rotation  30 seconds each side with counter upper body stretch    Quad Stretch  Left;Right;30 seconds prone with strap    ITB Stretch  Left;Right;30 seconds cross body with strap      Lumbar Exercises: Aerobic   Nustep  L6: 6 min, legs only       Lumbar Exercises: Standing   Functional Squats  10 reps single leg sit to/from stand, high mat     Other Standing Lumbar Exercises  2x10 single leg dead lifts, VC for form       Lumbar Exercises: Supine   Heel Slides  10 reps 3 sets each side, with heel hovering    Bridge  10 reps 3 sets figure 4 each side  PT Education - 09/09/17 1532    Education provided  Yes    Education Details  HEP and continueation of HEP    Person(s) Educated  Patient    Methods  Explanation;Demonstration;Handout    Comprehension  Returned demonstration;Verbalized understanding          PT Long Term Goals - 09/09/17 1510      PT LONG TERM GOAL #1   Title  I with advanced HEP and demo safe body mechanics while simulated yard work ( 09/17/17)     Status  Achieved      PT LONG TERM GOAL #2   Title  improve bilat hip strength =/> 5-/5 to support body for hiking trip ( 09/17/17)     Status  Achieved      PT LONG TERM GOAL #3   Title  improve FOTO =/< 25% limited  ( 09/17/17)     Status  Achieved 17% limited      PT LONG TERM GOAL #4   Title  tolerate moderate level core work without increased in back pain and good form ( 09/17/17)    Status  Achieved      PT LONG TERM GOAL #5   Title  report no more than 1/10 back pain with daily activities ( 09/17/17)     Status  Achieved            Plan - 09/09/17 1547    Clinical Impression Statement  Cassell is doing very well, he has met all his goals and able to work in the yard with minimal  to no pain.  He has not attempted hiking yet.  He is a little worried about being discharged so we will place him on hold for 2 weeks.  If pain returns he will call and come in,  If we don't hear from him will discharge the first week of May.     PT Next Visit Plan  place on hold for 2 weeks.     Consulted and Agree with Plan of Care  Patient       Patient will benefit from skilled therapeutic intervention in order to improve the following deficits and impairments:     Visit Diagnosis: Chronic right-sided low back pain with right-sided sciatica  Pain in right hip  Muscle weakness (generalized)  Muscle spasm of back     Problem List Patient Active Problem List   Diagnosis Date Noted  . Fasting hyperglycemia 08/06/2017  . Chronic right hip pain 08/06/2017  . Chronic right-sided low back pain without sciatica 08/06/2017  . Hypertriglyceridemia 08/06/2017  . 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  . Gastroesophageal reflux disease without esophagitis 07/09/2017  . Hypertension goal BP (blood pressure) < 130/80 07/09/2017  . Class 2 obesity due to excess calories in adult 07/09/2017    Adrian [name] PT  09/09/2017, 3:55 PM  [facility] [place], [place], [address] Phone: [phone]   Fax:  [phone]  Name: Adrian [name] MRN: [id number] Date of Birth: 03-12-1961   PHYSICAL THERAPY DISCHARGE SUMMARY  Visits from Start of Care: 6  Current functional level related to goals / functional outcomes: See above for function at last visit. He was doing well    Remaining deficits: As above.  Patient requested to be placed on hold for 2 wks just incase his symptoms returned.  This time frame has past.    Education / Equipment: HEP Plan: Patient agrees to discharge.  Patient goals were met. Patient is being discharged due to meeting the stated rehab goals.  ?????   Adrian [name], PT 10/08/17 11:30  AM

## 2017-09-17 ENCOUNTER — Ambulatory Visit: Payer: 59 | Admitting: Sports Medicine

## 2017-09-17 ENCOUNTER — Encounter: Payer: Self-pay | Admitting: Sports Medicine

## 2017-09-17 DIAGNOSIS — G8929 Other chronic pain: Secondary | ICD-10-CM | POA: Diagnosis not present

## 2017-09-17 DIAGNOSIS — M545 Low back pain, unspecified: Secondary | ICD-10-CM

## 2017-09-17 DIAGNOSIS — M1611 Unilateral primary osteoarthritis, right hip: Secondary | ICD-10-CM | POA: Diagnosis not present

## 2017-09-17 MED ORDER — CELECOXIB 200 MG PO CAPS: ORAL_CAPSULE | ORAL | 2 refills | Status: DC

## 2017-09-17 NOTE — Progress Notes (Signed)
Subjective:    I'm seeing this patient as a consultation for:  Adrian [name], PGena FrayA-C  CC: hip pain  HPI: Adrian [name], a 57yo male with pmh significant for degenerative disc disease and GERD, presents today with a cc of lateral hip pain that begin in April 2013.  Pt reports that he was hiking on a trail and began to experience a burning/tearing sensation in his right  lateral hip.  When exacerbated, the right lateral hip pain radiates anteriorly and posteriorly in the same plane but never down the leg and into the foot. Back in the 2013 the pain was a 10/10 and the pt was able to alleviate some of the symptom with hydrocodone followed by iubuprofen.  Pt endorses that pain worsens with activity and he continued to be active while experincing pain.  The pain today is 1/10.  When exacerbated with activity, the right lateral hip pain can be as high as 3/10.  The right lateral hip pain is not with him constantly but it has been bothering him on and off since 2013. Pt endorsed utilizing tylenol in the past for pain management, but denies use of physical therapy (formal or otherwise) for this specific pain.  Pt endorses some mechanical symptoms of catching with sudden movements (twists)   I reviewed the past medical history, family history, social history, surgical history, and allergies today and no changes were needed.  Please see the problem list section below in epic for further details.  Past Medical History: Past Medical History:  Diagnosis Date  . Chronic right hip pain 08/06/2017  . Chronic right-sided low back pain without sciatica 08/06/2017  . GERD (gastroesophageal reflux disease)   . Hypertension   . Hypertriglyceridemia 08/06/2017  . Seasonal allergies    Past Surgical History: Past Surgical History:  Procedure Laterality Date  . ESOPHAGOGASTRODUODENOSCOPY    . NO PAST SURGERIES     Social History: Social History   Socioeconomic History  . Marital status: Married    Spouse  name: Not on file  . Number of children: Not on file  . Years of education: Not on file  . Highest education level: Not on file  Occupational History  . Not on file  Social Needs  . Financial resource strain: Not on file  . Food insecurity:    Worry: Not on file    Inability: Not on file  . Transportation needs:    Medical: Not on file    Non-medical: Not on file  Tobacco Use  . Smoking status: Never Smoker  . Smokeless tobacco: Never Used  Substance and Sexual Activity  . Alcohol use: No    Frequency: Never  . Drug use: No  . Sexual activity: Yes    Birth control/protection: None  Lifestyle  . Physical activity:    Days per week: Not on file    Minutes per session: Not on file  . Stress: Not on file  Relationships  . Social connections:    Talks on phone: Not on file    Gets together: Not on file    Attends religious service: Not on file    Active member of club or organization: Not on file    Attends meetings of clubs or organizations: Not on file    Relationship status: Not on file  Other Topics Concern  . Not on file  Social History Narrative  . Not on file   Family History: Family History  Problem Relation Age of Onset  .  Dementia Mother   . Stroke Father   . COPD Father   . Heart failure Father   . Asthma Sister   . Heart attack Paternal Uncle   . Stroke Paternal Uncle    Allergies: No Known Allergies Medications: See med rec.  Review of Systems: No headache, visual changes, nausea, vomiting, diarrhea, constipation, dizziness, abdominal pain, skin rash, fevers, chills, night sweats, weight loss, swollen lymph nodes, body aches, joint swelling, muscle aches, chest pain, shortness of breath, mood changes, visual or auditory hallucinations.   Objective:   General: Well Developed, well nourished, and in no acute distress.  Neuro:  Extra-ocular muscles intact, able to move all 4 extremities, sensation grossly intact.  Deep tendon reflexes tested were  normal. Psych: Alert and oriented, mood congruent with affect. ENT:  Ears and nose appear unremarkable.  Hearing grossly normal. Neck: Unremarkable overall appearance, trachea midline.  No visible thyroid enlargement. Eyes: Conjunctivae and lids appear unremarkable.  Pupils equal and round. Skin: Warm and dry, no rashes noted.  Cardiovascular: Pulses palpable, no extremity edema.    Back Exam:  Inspection: Unremarkable  Motion: Flexion 45 deg, Extension 45 deg, Side Bending to 45 deg bilaterally,  Rotation to 45 deg bilaterally  SLR laying: Negative;   XSLR laying: Negative  Palpable tenderness: At lateral hip. FABER: negative. FADER negative; although pain in right hip was exacerbated with internal rotation Sensory change: Gross sensation intact to all lumbar and sacral dermatomes.  Reflexes: 2+ at both patellar tendons, 2+ at achilles tendons, but slightly diminished as compared to the left side.  Babinski's downgoing,.  Quad: 5/5 Hamstring: 5/5 Hip flexor: 5/5 Hip abductors: 5/5  Gait unremarkable.     Right Hip: ROM is intact and equal in both legs IR: 45 Deg, ER: 45 Deg, Flexion: 120 Deg, Extension: 100 Deg, Abduction: 45 Deg, Adduction: 45 Deg Strength IR: 5/5 (with pain), ER: 5/5, Flexion: 5/5, Extension: 5/5, Abduction: 5/5, Adduction: 5/5 Pelvic alignment unremarkable to inspection and palpation. Standing hip rotation and gait without trendelenburg sign / unsteadiness. Greater trochanter without tenderness to palpation. No tenderness over piriformis. No pain with FABER; some pain elicited with the internal rotation in FADIR. SI joint tenderness, and pain elicited with palpation but normal minimal SI movement.  Impression and Recommendations:   This case required medical decision making of moderate complexity.  Assessment-Arthritis of Hip Joint Pt has a pmh that would contraindicate use of cox 1/2 inhibiting nsaid (meloxicam);  Celebrix has been prescribed to help  alleviate pain.    Pt was  encouraged to complete osteoarthritic hip exercises every night and provided with educational handout.   It is likely that  Pt can also alleviate some of the symptom with weight loss.  Pt expressed interest in receiving assistance to acheive weight loss and was counseled to follow up with his PCP.  Rehab Exercises  ___________________________________________ Ihor Austin. Benjamin Stain, M.D., ABFM., CAQSM. Primary Care and Sports Medicine [facility]  Adjunct Instructor of Family Medicine  [employer]

## 2017-09-17 NOTE — Assessment & Plan Note (Signed)
There is some degenerative disc disease on x-ray, he is tender over his right sacroiliac joint, I think this is a principal pain generator in his lumbar spine. We are going to put this on the back burner for now.

## 2017-09-17 NOTE — Assessment & Plan Note (Signed)
Right hip joint is the principal pain generator, there is probably a labral injury as well. He did have some gastritis and GERD with standard NSAIDs, we are going to try a Cox 2 inhibitor, Celebrex. He will take this with food, hip joint and arthritis rehab exercises given. He is going to work with his PCP on weight loss as well.

## 2017-10-15 ENCOUNTER — Ambulatory Visit: Payer: 59 | Admitting: Sports Medicine

## 2017-10-15 ENCOUNTER — Encounter: Payer: Self-pay | Admitting: Sports Medicine

## 2017-10-15 DIAGNOSIS — M1611 Unilateral primary osteoarthritis, right hip: Secondary | ICD-10-CM

## 2017-10-15 DIAGNOSIS — M545 Low back pain, unspecified: Secondary | ICD-10-CM

## 2017-10-15 DIAGNOSIS — G8929 Other chronic pain: Secondary | ICD-10-CM

## 2017-10-15 NOTE — Progress Notes (Signed)
Subjective:    CC: f/u right hip arthritis; left lower back pain  HPI: Adrian [name] is a 57yo male with a pmh significant for ddd was previously diagnosed with right hip arthritis is being seen in clinic today to follow up on resolution of symptoms.  Pt reports that he takes the anti-inflammatory agent celebrix once a day, every day and will take an additional pill at night if the pain is severe (worsenes with increased activity).  Pt reports that his right hip pain has completely resolved.    Pt reports that he continues to have left axial back pain that is exacerbated with increased activity (landscaping, reaching down, repeated bending forward, lifting).  Pt qualifies pain as dull/aching, mild, and persistent. Pt reports that the lower back pain does not radiate and he does not have associated symptoms of numbness or tingling.     I reviewed the past medical history, family history, social history, surgical history, and allergies today and no changes were needed.  Please see the problem list section below in epic for further details.  Past Medical History: Past Medical History:  Diagnosis Date  . Chronic right hip pain 08/06/2017  . Chronic right-sided low back pain without sciatica 08/06/2017  . GERD (gastroesophageal reflux disease)   . Hypertension   . Hypertriglyceridemia 08/06/2017  . Seasonal allergies    Past Surgical History: Past Surgical History:  Procedure Laterality Date  . ESOPHAGOGASTRODUODENOSCOPY    . NO PAST SURGERIES     Social History: Social History   Socioeconomic History  . Marital status: Married    Spouse name: Not on file  . Number of children: Not on file  . Years of education: Not on file  . Highest education level: Not on file  Occupational History  . Not on file  Social Needs  . Financial resource strain: Not on file  . Food insecurity:    Worry: Not on file    Inability: Not on file  . Transportation needs:    Medical: Not on file   Non-medical: Not on file  Tobacco Use  . Smoking status: Never Smoker  . Smokeless tobacco: Never Used  Substance and Sexual Activity  . Alcohol use: No    Frequency: Never  . Drug use: No  . Sexual activity: Yes    Birth control/protection: None  Lifestyle  . Physical activity:    Days per week: Not on file    Minutes per session: Not on file  . Stress: Not on file  Relationships  . Social connections:    Talks on phone: Not on file    Gets together: Not on file    Attends religious service: Not on file    Active member of club or organization: Not on file    Attends meetings of clubs or organizations: Not on file    Relationship status: Not on file  Other Topics Concern  . Not on file  Social History Narrative  . Not on file   Family History: Family History  Problem Relation Age of Onset  . Dementia Mother   . Stroke Father   . COPD Father   . Heart failure Father   . Asthma Sister   . Heart attack Paternal Uncle   . Stroke Paternal Uncle    Allergies: No Known Allergies Medications: See med rec.  Review of Systems: No fevers, chills, night sweats, weight loss, chest pain, or shortness of breath.   Objective:    General: Well  Developed, well nourished, and in no acute distress. Pt is 5lbs less today as compared to his last visit a month ago in April (237lb on April 25th to 232lb 23 May)  Neuro: Alert and oriented x3, extra-ocular muscles intact, sensation grossly intact.  HEENT: Normocephalic, atraumatic, pupils equal round reactive to light, neck supple, no masses, no lymphadenopathy, thyroid nonpalpable.  Skin: Warm and dry, no rashes. Cardiac: Regular rate and rhythm, no murmurs rubs or gallops, no lower extremity edema.  Respiratory: Clear to auscultation bilaterally. Not using accessory muscles, speaking in full sentences.  Tender to palpation on the left lower back over the SI joint.   Impression and Recommendations:    Assessment-Sacroiliac Joint  Injury Pt was encouraged to wear a back brace on days when he is engaging in increased activity for additional stabilization; Pt was also provided an educational handout on SI joint pain and advised to complete exercises nightly to maintain strength of the joint, range of motion around the joint and diminish joint pain long term.  Pt was advised to follow up as needed. Pt was encouraged to continue to take celebrix once daily as needed.    ___________________________________________ Ihor Austin. Benjamin Stain, M.D., ABFM., CAQSM. Primary Care and Sports Medicine [facility]  Adjunct Instructor of Family Medicine  [employer]

## 2017-10-15 NOTE — Assessment & Plan Note (Signed)
Pain resolved with Celebrex and rehab exercises.

## 2017-10-15 NOTE — Assessment & Plan Note (Signed)
This pain is more over the left sacroiliac joint today, adding SI joint rehab exercises, he is doing some heart skipping in his backyard, advised to wear good back brace when lifting heavy cinderblocks and stones.

## 2017-11-02 ENCOUNTER — Encounter: Payer: Self-pay | Admitting: Physician Assistant

## 2017-11-02 ENCOUNTER — Ambulatory Visit (INDEPENDENT_AMBULATORY_CARE_PROVIDER_SITE_OTHER): Payer: 59 | Admitting: Physician Assistant

## 2017-11-02 VITALS — BP 126/79 | HR 60 | Wt 233.0 lb

## 2017-11-02 DIAGNOSIS — J301 Allergic rhinitis due to pollen: Secondary | ICD-10-CM | POA: Diagnosis not present

## 2017-11-02 DIAGNOSIS — H6121 Impacted cerumen, right ear: Secondary | ICD-10-CM | POA: Diagnosis not present

## 2017-11-02 MED ORDER — FLUTICASONE PROPIONATE 50 MCG/ACT NA SUSP: 1.0000 | Freq: Every day | NASAL | 3 refills | Status: DC

## 2017-11-02 MED ORDER — ACETIC ACID 2 % OT SOLN: 4.0000 [drp] | Freq: Four times a day (QID) | OTIC | 0 refills | Status: DC

## 2017-11-02 NOTE — Progress Notes (Signed)
HPI:                                                                Adrian [name] is a 57 y.o. male who [facility] Medicine today for right ear fullness  1 month of muffled hearing, tinnitus and aural fullness on the right side. Reports intermittent ear pain. Denies hearing loss or otorrhea. Also has been having sinus pressure, nasal congestion and post-nasal drip that occasionally causes a cough. Denies fever, purulent nasal drainage, sputum production.     No flowsheet data found.    Past Medical History:  Diagnosis Date  . Chronic right hip pain 08/06/2017  . Chronic right-sided low back pain without sciatica 08/06/2017  . GERD (gastroesophageal reflux disease)   . Hypertension   . Hypertriglyceridemia 08/06/2017  . Seasonal allergies    Past Surgical History:  Procedure Laterality Date  . ESOPHAGOGASTRODUODENOSCOPY    . NO PAST SURGERIES     Social History   Tobacco Use  . Smoking status: Never Smoker  . Smokeless tobacco: Never Used  Substance Use Topics  . Alcohol use: No    Frequency: Never   family history includes Asthma in his sister; COPD in his father; Dementia in his mother; Heart attack in his paternal uncle; Heart failure in his father; Stroke in his father and paternal uncle.    ROS: negative except as noted in the HPI  Medications: Current Outpatient Medications  Medication Sig Dispense Refill  . acetaminophen (TYLENOL) 650 MG CR tablet Take 1-2 tablets (650-1,300 mg total) by mouth every 8 (eight) hours as needed for pain. 90 tablet 3  . aspirin EC 81 MG tablet Take 1 tablet (81 mg total) by mouth daily. 90 tablet 3  . celecoxib (CELEBREX) 200 MG capsule One to 2 tablets by mouth daily as needed for pain. 60 capsule 2  . famotidine (PEPCID) 20 MG tablet Take 1 tablet (20 mg total) by mouth 2 (two) times daily as needed for heartburn or indigestion. 60 tablet 1  . ranitidine (ZANTAC) 150 MG  tablet Take 150 mg by mouth 2 (two) times daily.     No current facility-administered medications for this visit.    No Known Allergies     Objective:  BP 126/79   Pulse 60   Wt 233 lb (105.7 kg)   BMI 37.61 kg/m  Gen:  alert, not ill-appearing, no distress, appropriate for age HEENT: head normocephalic without obvious abnormality, conjunctiva and cornea clear, right TM obstructed by cerumen, left TM pearly gray and semi-transparent, no mastoid tenderness, nasal mucosa edematous, no sinus tenderness, oropharynx clear, neck supple, no cervical adenopathy, trachea midline Pulm: Normal work of breathing, normal phonation Neuro: alert and oriented x 3, no tremor MSK: extremities atraumatic, normal gait and station Skin: intact, no rashes on exposed skin, no jaundice, no cyanosis Psych: well-groomed, cooperative, good eye contact, euthymic mood, affect mood-congruent, speech is articulate, and thought processes clear and goal-directed  Procedure: Right ear irrigation Indication: hearing loss due to cerumen impaction Verbal consent obtain R ear canal soaked with Colace for 15 minutes Ear irrigated with peroxide/H2O by Olivia MackieEvonia Henry, CMA.  Manual debridement was not performed Patient tolerated the procedure without any complications. Post-irrigation  examination shows cerumen was partially removed. There is a small amount of white debris. TM is intact.     No results found for this or any previous visit (from the past 72 hour(s)). No results found.    Assessment and Plan: 57 y.o. male with   Hearing loss due to cerumen impaction, right - Plan: acetic acid 2 % otic solution  Seasonal allergic rhinitis due to pollen - Plan: fluticasone (FLONASE) 50 MCG/ACT nasal spray  Successful right ear irrigation performed in office today (see procedure note above). There was a small amount of residual white debris. No significant ear canal edema or otorrhea, but I will cover for otitis externa  with acetic acid Suspect there is also some underlying eustachian tube dysfunction. Starting antihistamine and intranasal steroid   Patient education and anticipatory guidance given Patient agrees with treatment plan Follow-up as needed if symptoms worsen or fail to improve  Levonne Hubert PA-C

## 2017-11-02 NOTE — Patient Instructions (Signed)
For allergies/sinus symptoms - netti pot twice a day - Flonase one spray each nostril nightly - continue Claritin daily  For right ear pain: - acetic acid drops, 4 drops 4 times daily for 1 week  Earwax Buildup, Adult The ears produce a substance called earwax that helps keep bacteria out of the ear and protects the skin in the ear canal. Occasionally, earwax can build up in the ear and cause discomfort or hearing loss. What increases the risk? This condition is more likely to develop in people who:  Are male.  Are elderly.  Naturally produce more earwax.  Clean their ears often with cotton swabs.  Use earplugs often.  Use in-ear headphones often.  Wear hearing aids.  Have narrow ear canals.  Have earwax that is overly thick or sticky.  Have eczema.  Are dehydrated.  Have excess hair in the ear canal.  What are the signs or symptoms? Symptoms of this condition include:  Reduced or muffled hearing.  A feeling of fullness in the ear or feeling that the ear is plugged.  Fluid coming from the ear.  Ear pain.  Ear itch.  Ringing in the ear.  Coughing.  An obvious piece of earwax that can be seen inside the ear canal.  How is this diagnosed? This condition may be diagnosed based on:  Your symptoms.  Your medical history.  An ear exam. During the exam, your health care provider will look into your ear with an instrument called an otoscope.  You may have tests, including a hearing test. How is this treated? This condition may be treated by:  Using ear drops to soften the earwax.  Having the earwax removed by a health care provider. The health care provider may: ? Flush the ear with water. ? Use an instrument that has a loop on the end (curette). ? Use a suction device.  Surgery to remove the wax buildup. This may be done in severe cases.  Follow these instructions at home:  Take over-the-counter and prescription medicines only as told by your  health care provider.  Do not put any objects, including cotton swabs, into your ear. You can clean the opening of your ear canal with a washcloth or facial tissue.  Follow instructions from your health care provider about cleaning your ears. Do not over-clean your ears.  Drink enough fluid to keep your urine clear or pale yellow. This will help to thin the earwax.  Keep all follow-up visits as told by your health care provider. If earwax builds up in your ears often or if you use hearing aids, consider seeing your health care provider for routine, preventive ear cleanings. Ask your health care provider how often you should schedule your cleanings.  If you have hearing aids, clean them according to instructions from the manufacturer and your health care provider. Contact a health care provider if:  You have ear pain.  You develop a fever.  You have blood, pus, or other fluid coming from your ear.  You have hearing loss.  You have ringing in your ears that does not go away.  Your symptoms do not improve with treatment.  You feel like the room is spinning (vertigo). Summary  Earwax can build up in the ear and cause discomfort or hearing loss.  The most common symptoms of this condition include reduced or muffled hearing and a feeling of fullness in the ear or feeling that the ear is plugged.  This condition may be diagnosed  based on your symptoms, your medical history, and an ear exam.  This condition may be treated by using ear drops to soften the earwax or by having the earwax removed by a health care provider.  Do not put any objects, including cotton swabs, into your ear. You can clean the opening of your ear canal with a washcloth or facial tissue. This information is not intended to replace advice given to you by your health care provider. Make sure you discuss any questions you have with your health care provider. Document Released: 06/19/2004 Document Revised: 07/23/2016  Document Reviewed: 07/23/2016 Elsevier Interactive Patient Education  [employer]2018 [employer]

## 2017-11-04 ENCOUNTER — Other Ambulatory Visit: Payer: Self-pay | Admitting: Physician Assistant

## 2017-11-04 MED ORDER — NEOMYCIN-POLYMYXIN-HC 3.5-10000-1 OT SOLN: 4.0000 [drp] | Freq: Three times a day (TID) | OTIC | 0 refills | Status: AC

## 2017-11-05 ENCOUNTER — Ambulatory Visit: Payer: 59 | Admitting: Physician Assistant

## 2018-02-19 ENCOUNTER — Other Ambulatory Visit: Payer: Self-pay | Admitting: Sports Medicine

## 2018-02-19 DIAGNOSIS — M1611 Unilateral primary osteoarthritis, right hip: Secondary | ICD-10-CM

## 2018-05-06 ENCOUNTER — Other Ambulatory Visit: Payer: Self-pay | Admitting: Sports Medicine

## 2018-05-06 DIAGNOSIS — M1611 Unilateral primary osteoarthritis, right hip: Secondary | ICD-10-CM

## 2018-07-20 ENCOUNTER — Ambulatory Visit: Payer: BLUE CROSS/BLUE SHIELD | Admitting: Physician Assistant

## 2018-07-20 ENCOUNTER — Other Ambulatory Visit: Payer: Self-pay | Admitting: Sports Medicine

## 2018-07-20 ENCOUNTER — Encounter: Payer: Self-pay | Admitting: Physician Assistant

## 2018-07-20 VITALS — BP 135/86 | Wt 235.0 lb

## 2018-07-20 DIAGNOSIS — M159 Polyosteoarthritis, unspecified: Secondary | ICD-10-CM

## 2018-07-20 DIAGNOSIS — M7752 Other enthesopathy of left foot: Secondary | ICD-10-CM

## 2018-07-20 DIAGNOSIS — I10 Essential (primary) hypertension: Secondary | ICD-10-CM

## 2018-07-20 DIAGNOSIS — Z9189 Other specified personal risk factors, not elsewhere classified: Secondary | ICD-10-CM | POA: Insufficient documentation

## 2018-07-20 DIAGNOSIS — M1611 Unilateral primary osteoarthritis, right hip: Secondary | ICD-10-CM

## 2018-07-20 DIAGNOSIS — M150 Primary generalized (osteo)arthritis: Secondary | ICD-10-CM | POA: Diagnosis not present

## 2018-07-20 DIAGNOSIS — R7301 Impaired fasting glucose: Secondary | ICD-10-CM | POA: Diagnosis not present

## 2018-07-20 DIAGNOSIS — M8949 Other hypertrophic osteoarthropathy, multiple sites: Secondary | ICD-10-CM

## 2018-07-20 DIAGNOSIS — M2550 Pain in unspecified joint: Secondary | ICD-10-CM

## 2018-07-20 DIAGNOSIS — Z8619 Personal history of other infectious and parasitic diseases: Secondary | ICD-10-CM | POA: Diagnosis not present

## 2018-07-20 DIAGNOSIS — H6121 Impacted cerumen, right ear: Secondary | ICD-10-CM

## 2018-07-20 DIAGNOSIS — R768 Other specified abnormal immunological findings in serum: Secondary | ICD-10-CM | POA: Insufficient documentation

## 2018-07-20 DIAGNOSIS — M25572 Pain in left ankle and joints of left foot: Secondary | ICD-10-CM | POA: Diagnosis not present

## 2018-07-20 DIAGNOSIS — M898X1 Other specified disorders of bone, shoulder: Secondary | ICD-10-CM

## 2018-07-20 MED ORDER — CELECOXIB 200 MG PO CAPS: 200.0000 mg | ORAL_CAPSULE | Freq: Two times a day (BID) | ORAL | 3 refills | Status: DC

## 2018-07-20 MED ORDER — AMLODIPINE BESYLATE 5 MG PO TABS: 5.0000 mg | ORAL_TABLET | Freq: Every day | ORAL | 1 refills | Status: DC

## 2018-07-20 NOTE — Progress Notes (Signed)
HPI:                                                                Adrian [name] is a 58 y.o. male who presents to [facility]: Primary Care Sports Medicine today for annual physical exam  Current concerns: joint pain, ear fullness  Right ear fullness: pressure and muffled hearing for months. Had a cerumen impaction in the same ear which was irrigated about 8 months ago. States symptoms have been waxing and waning and has tried OTC debrox drops and Q-tips.   HTN: managing with diet/lifestyle. Has not monitored his BP recently. Reports he lost weight,  Denies vision change, headache, chest pain with exertion, orthopnea, lightheadedness, syncope and edema. Risk factors include: male sex, family hx, obesity  Hip pain: chronic anterior right hip pain x 9 years. He used to wear his gun belt in that area and states it improved when he retired and was no longer wearing it. Continues to have intermittent, dull pain with bending, shoveling, and yard work activities.Pain radiates to his right lumbar region. Denies radicular symptoms. Tylenol helps. He does not take NSAIDs due to history of gastritis.  Shoulder pain: posterior shoulder blade area, bilateral, moderate, persistent, worse on the left. Wakes him from sleep at night. Onset about 4-6 weeks ago.  No known injury or trauma.  Knee pain: right anterior knee pain is new, left knee pain is recurrent.  Onset about 4-6 weeks ago No known injury or trauma. No prior surgery.  Since running out of his Celebrex he states all of his joint pain has been more pronounced.  No joint swelling, fever, fatigue, rashes. He is working part-time and doing a lot of walking/standing for 5-6 hours. Walks probably 3 miles during a shift.  He has had intermittent nightsweats and heart racing where his heart "didn't feel right," where he wakes up feeling anxious. No PND or orthopnea. No exertional chest pain. No change in exercise tolerance.  This has occurred on 5 occasions over the last year. No prior sleep study.   Lastly, he brought a letter dated 2010 from the [employer] saying that his lab testing showed evidence of prior hepatitis B infection. He has no memory of having this type of illness. He believes he was vaccinated while in the [employer].  Depression screen [facility] 2/9 07/20/2018 07/09/2017  Decreased Interest 0 0  Down, Depressed, Hopeless 0 0  PHQ - 2 Score 0 0    No flowsheet data found.    Past Medical History:  Diagnosis Date  . Chronic right hip pain 08/06/2017  . Chronic right-sided low back pain without sciatica 08/06/2017  . GERD (gastroesophageal reflux disease)   . Hypertension   . Hypertriglyceridemia 08/06/2017  . Seasonal allergies    Past Surgical History:  Procedure Laterality Date  . ESOPHAGOGASTRODUODENOSCOPY    . NO PAST SURGERIES     Social History   Tobacco Use  . Smoking status: Never Smoker  . Smokeless tobacco: Never Used  Substance Use Topics  . Alcohol use: No    Frequency: Never   family history includes Asthma in his sister; COPD in his father; Dementia in his mother; Heart attack in his paternal uncle; Heart failure in his father;  Stroke in his father and paternal uncle.   Review of Systems  HENT: Positive for hearing loss (right ear). Negative for ear discharge and ear pain.   Musculoskeletal: Positive for back pain and joint pain (knees, l ankle, shoulders).  Psychiatric/Behavioral: The patient has insomnia.   All other systems reviewed and are negative.    Medications: Current Outpatient Medications  Medication Sig Dispense Refill  . aspirin EC 81 MG tablet Take 1 tablet (81 mg total) by mouth daily. 90 tablet 3  . celecoxib (CELEBREX) 200 MG capsule Take 1 capsule (200 mg total) by mouth 2 (two) times daily. 60 capsule 3  . diphenhydramine-acetaminophen (TYLENOL PM) 25-500 MG TABS tablet Take 1 tablet by mouth at bedtime as needed.    . fluticasone (FLONASE)  50 MCG/ACT nasal spray Place 1 spray into both nostrils at bedtime. 16 g 3  . loratadine (CLARITIN) 10 MG tablet Take 10 mg by mouth daily.    . ranitidine (ZANTAC) 150 MG tablet Take 150 mg by mouth 2 (two) times daily.     No current facility-administered medications for this visit.    No Known Allergies     Objective:  BP 135/86   Wt 235 lb (106.6 kg)   BMI 37.93 kg/m  Gen:  alert, not ill-appearing, no distress, appropriate for age, obese male HEENT: head normocephalic without obvious abnormality, conjunctiva and cornea clear, right ear canal there is a moderate amount of whitish-yellow debris, trachea midline Pulm: Normal work of breathing, normal phonation, clear to auscultation bilaterally, no wheezes, rales or rhonchi CV: Normal rate, regular rhythm, s1 and s2 distinct, no murmurs, clicks or rubs  Neuro: alert and oriented x 3, no tremor MSK: extremities atraumatic, normal gait and station, no peripheral edema Neck: atraumatic, no midline tenderness, ROM intact, bilateral periscapular point tenderness Skin: intact, no rashes on exposed skin, no jaundice, no cyanosis Psych: well-groomed, cooperative, good eye contact, euthymic mood, affect mood-congruent, speech is articulate, and thought processes clear and goal-directed  The 10-year ASCVD risk score Denman George DC Jr., et al., 2013) is: 6.8%   Values used to calculate the score:     Age: 63 years     Sex: Male     Is Non-Hispanic African American: No     Diabetic: No     Tobacco smoker: No     Systolic Blood Pressure: 135 mmHg     Is BP treated: No     HDL Cholesterol: 36 mg/dL     Total Cholesterol: 171 mg/dL    No results found for this or any previous visit (from the past 72 hour(s)). No results found.    Assessment and Plan: 58 y.o. male with   .Essie was seen today for medication management and joint pain.  Diagnoses and all orders for this visit:  Primary osteoarthritis involving multiple joints  Acute  left ankle pain  Fasting hyperglycemia -     Hemoglobin A1c  Hypertension goal BP (blood pressure) < 130/80 -     COMPLETE METABOLIC PANEL WITH GFR -     amLODipine (NORVASC) 5 MG tablet; Take 1 tablet (5 mg total) by mouth daily.  Periscapular pain  At risk for obstructive sleep apnea -     Home sleep test; Future -     Home sleep test  Polyarthralgia -     CK -     C-reactive protein -     Uric acid -     Sedimentation rate -  Rheumatoid factor -     COMPLETE METABOLIC PANEL WITH GFR  History of hepatitis B virus infection -     Hepatitis B surface antibody,quantitative -     Hepatitis B Surface AntiGEN -     Hepatitis B Core Antibody, total  Retrocalcaneal bursitis, left  Cerumen debris on tympanic membrane of right ear -     Ambulatory referral to ENT   Cerumen debris, right ear aural fullness - attempted ear irrigation, on re-examination debris appeared possibly fungal in nature. Unable to remove with further irrigation and debris was too close to TM to attempt manual debridement - patient has been symptomatic for >6 months - instructed to avoid Qtips - referral placed to ENT  HTN BP Readings from Last 3 Encounters:  07/21/18 (!) 150/88  07/20/18 135/86  11/02/17 126/79  - not controlled with lifestyle measures alone - starting Amlodipine - counseled on therapeutic lifestyle changes  At Risk OSA - patient endorses nightsweats and nocturnal heart palpitations - +STOPBANG- male sex, HTN, snoring, BMI>35 - home sleep study ordered  Polyarthralgia - likely OA - given increased destribution of joint pain, will obtain rheum labs - Celebrex refilled - instructed to f/u with Sports Medicine  Patient education and anticipatory guidance given Patient agrees with treatment plan Follow-up in 6 months for med mgmt or sooner as needed if symptoms worsen or fail to improve  I spent 40 minutes with this patient, greater than 50% was face-to-face time counseling  regarding the above diagnoses  Levonne Hubert PA-C

## 2018-07-20 NOTE — Patient Instructions (Addendum)
For your blood pressure: - Goal <130/80 (Ideally 120's/70's) - take your blood pressure medication in the morning (unless instructed differently) - take baby aspirin 81 mg daily to help prevent heart attack/stroke - monitor and log blood pressures at home - check around the same time each day in a relaxed setting - Limit salt to <2500 mg/day - Follow DASH (Dietary Approach to Stopping Hypertension) eating plan - Try to get at least 150 minutes of aerobic exercise per week - Aim to go on a brisk walk 30 minutes per day at least 5 days per week. If you're not active, gradually increase how long you walk by 5 minutes each week - limit alcohol: 2 standard drinks per day for men and 1 per day for women - avoid tobacco/nicotine products. Consider smoking cessation if you smoke - weight loss: 7% of current body weight can reduce your blood pressure by 5-10 points - follow-up at least every 6 months for your blood pressure. Follow-up sooner if your BP is not controlled  Arthritis Arthritis is a term that is commonly used to refer to joint pain or joint disease. There are more than 100 types of arthritis. What are the causes? The most common cause of this condition is wear and tear of a joint. Other causes include:  Gout.  Inflammation of a joint.  An infection of a joint.  Sprains and other injuries near the joint.  A drug reaction or allergic reaction. In some cases, the cause may not be known. What are the signs or symptoms? The main symptom of this condition is pain in the joint with movement. Other symptoms include:  Redness, swelling, or stiffness at a joint.  Warmth coming from the joint.  Fever.  Overall feeling of illness. How is this diagnosed? This condition may be diagnosed with a physical exam and tests, including:  Blood tests.  Urine tests.  Imaging tests, such as MRI, X-rays, or a CT scan. Sometimes, fluid is removed from a joint for testing. How is this  treated? Treatment for this condition may involve:  Treatment of the cause, if it is known.  Rest.  Raising (elevating) the joint.  Applying cold or hot packs to the joint.  Medicines to improve symptoms and reduce inflammation.  Injections of a steroid such as cortisone into the joint to help reduce pain and inflammation. Depending on the cause of your arthritis, you may need to make lifestyle changes to reduce stress on your joint. These changes may include exercising more and losing weight. Follow these instructions at home: Medicines  Take over-the-counter and prescription medicines only as told by your health care provider.  Do not take aspirin to relieve pain if gout is suspected. Activity  Rest your joint if told by your health care provider. Rest is important when your disease is active and your joint feels painful, swollen, or stiff.  Avoid activities that make the pain worse. It is important to balance activity with rest.  Exercise your joint regularly with range-of-motion exercises as told by your health care provider. Try doing low-impact exercise, such as: ? Swimming. ? Water aerobics. ? Biking. ? Walking. Joint Care   If your joint is swollen, keep it elevated if told by your health care provider.  If your joint feels stiff in the morning, try taking a warm shower.  If directed, apply heat to the joint. If you have diabetes, do not apply heat without permission from your health care provider. ? Put a  towel between the joint and the hot pack or heating pad. ? Leave the heat on the area for 20-30 minutes.  If directed, apply ice to the joint: ? Put ice in a plastic bag. ? Place a towel between your skin and the bag. ? Leave the ice on for 20 minutes, 2-3 times per day.  Keep all follow-up visits as told by your health care provider. This is important. Contact a health care provider if:  The pain gets worse.  You have a fever. Get help right away  if:  You develop severe joint pain, swelling, or redness.  Many joints become painful and swollen.  You develop severe back pain.  You develop severe weakness in your leg.  You cannot control your bladder or bowels. This information is not intended to replace advice given to you by your health care provider. Make sure you discuss any questions you have with your health care provider. Document Released: 06/19/2004 Document Revised: 10/18/2015 Document Reviewed: 08/07/2014 Elsevier Interactive Patient Education  2019 [employer].

## 2018-07-21 ENCOUNTER — Ambulatory Visit (INDEPENDENT_AMBULATORY_CARE_PROVIDER_SITE_OTHER): Payer: BLUE CROSS/BLUE SHIELD | Admitting: Sports Medicine

## 2018-07-21 ENCOUNTER — Encounter: Payer: Self-pay | Admitting: Sports Medicine

## 2018-07-21 ENCOUNTER — Ambulatory Visit (INDEPENDENT_AMBULATORY_CARE_PROVIDER_SITE_OTHER): Payer: BLUE CROSS/BLUE SHIELD

## 2018-07-21 DIAGNOSIS — M25512 Pain in left shoulder: Secondary | ICD-10-CM | POA: Diagnosis not present

## 2018-07-21 DIAGNOSIS — B353 Tinea pedis: Secondary | ICD-10-CM

## 2018-07-21 DIAGNOSIS — M25562 Pain in left knee: Secondary | ICD-10-CM

## 2018-07-21 DIAGNOSIS — M2550 Pain in unspecified joint: Secondary | ICD-10-CM

## 2018-07-21 DIAGNOSIS — M79672 Pain in left foot: Secondary | ICD-10-CM

## 2018-07-21 DIAGNOSIS — M1711 Unilateral primary osteoarthritis, right knee: Secondary | ICD-10-CM

## 2018-07-21 DIAGNOSIS — G8929 Other chronic pain: Secondary | ICD-10-CM

## 2018-07-21 LAB — COMPLETE METABOLIC PANEL WITH GFR
AG RATIO: 1.9 (calc) (ref 1.0–2.5)
ALT: 25 U/L (ref 9–46)
AST: 16 U/L (ref 10–35)
Albumin: 4.8 g/dL (ref 3.6–5.1)
Alkaline phosphatase (APISO): 59 U/L (ref 35–144)
BILIRUBIN TOTAL: 0.4 mg/dL (ref 0.2–1.2)
BUN: 15 mg/dL (ref 7–25)
CHLORIDE: 102 mmol/L (ref 98–110)
CO2: 30 mmol/L (ref 20–32)
Calcium: 10 mg/dL (ref 8.6–10.3)
Creat: 1.22 mg/dL (ref 0.70–1.33)
GFR, Est African American: 77 mL/min/{1.73_m2} (ref >=60)
GFR, Est Non African American: 66 mL/min/{1.73_m2} (ref >=60)
Globulin: 2.5 g/dL (calc) (ref 1.9–3.7)
Glucose, Bld: 96 mg/dL (ref 65–99)
Potassium: 4.4 mmol/L (ref 3.5–5.3)
Sodium: 138 mmol/L (ref 135–146)
Total Protein: 7.3 g/dL (ref 6.1–8.1)

## 2018-07-21 LAB — C-REACTIVE PROTEIN: CRP: 2.4 mg/L (ref <8.0)

## 2018-07-21 LAB — HEPATITIS B CORE ANTIBODY, TOTAL: Hep B Core Total Ab: REACTIVE — AB

## 2018-07-21 LAB — HEMOGLOBIN A1C
Hgb A1c MFr Bld: 5.3 % of total Hgb (ref <5.7)
Mean Plasma Glucose: 105 (calc)
eAG (mmol/L): 5.8 (calc)

## 2018-07-21 LAB — SEDIMENTATION RATE: Sed Rate: 2 mm/h (ref 0–20)

## 2018-07-21 LAB — CK: Total CK: 80 U/L (ref 44–196)

## 2018-07-21 LAB — RHEUMATOID FACTOR

## 2018-07-21 LAB — HEPATITIS B SURFACE ANTIBODY, QUANTITATIVE: Hep B S AB Quant (Post): 765 m[IU]/mL (ref >=10)

## 2018-07-21 LAB — HEPATITIS B SURFACE ANTIGEN: Hepatitis B Surface Ag: NONREACTIVE

## 2018-07-21 LAB — URIC ACID: Uric Acid, Serum: 5.2 mg/dL (ref 4.0–8.0)

## 2018-07-21 MED ORDER — TERBINAFINE HCL 250 MG PO TABS: 250.0000 mg | ORAL_TABLET | Freq: Every day | ORAL | 0 refills | Status: AC

## 2018-07-21 NOTE — Progress Notes (Signed)
Subjective:    I'm seeing this patient as a consultation for: Gena Fray, PA-C  CC: Polyarthralgia  HPI: This is a pleasant 58 year old male referred to me for multiple joint aches and pains.    He describes left shoulder pain, localized over the deltoid and trapezius, worse with internal rotation.  It tends to wake him from sleep particularly if he lays on the ipsilateral side.    He also has axial low back pain, worse with sitting, flexion, Valsalva.  No bowel or bladder dysfunction, saddle numbness or constitutional symptoms.  Bilateral knee pain, significant gelling, anterior, worse going up and down stairs and squatting.  No mechanical symptoms, no trauma.    Left ankle pain, localized posteriorly over the Achilles insertion.  I reviewed the past medical history, family history, social history, surgical history, and allergies today and no changes were needed.  Please see the problem list section below in epic for further details.  Past Medical History: Past Medical History:  Diagnosis Date  . Chronic right hip pain 08/06/2017  . Chronic right-sided low back pain without sciatica 08/06/2017  . GERD (gastroesophageal reflux disease)   . Hypertension   . Hypertriglyceridemia 08/06/2017  . Seasonal allergies    Past Surgical History: Past Surgical History:  Procedure Laterality Date  . ESOPHAGOGASTRODUODENOSCOPY    . NO PAST SURGERIES     Social History: Social History   Socioeconomic History  . Marital status: Married    Spouse name: Not on file  . Number of children: Not on file  . Years of education: Not on file  . Highest education level: Not on file  Occupational History  . Not on file  Social Needs  . Financial resource strain: Not on file  . Food insecurity:    Worry: Not on file    Inability: Not on file  . Transportation needs:    Medical: Not on file    Non-medical: Not on file  Tobacco Use  . Smoking status: Never Smoker  . Smokeless tobacco:  Never Used  Substance and Sexual Activity  . Alcohol use: No    Frequency: Never  . Drug use: No  . Sexual activity: Yes    Birth control/protection: None  Lifestyle  . Physical activity:    Days per week: Not on file    Minutes per session: Not on file  . Stress: Not on file  Relationships  . Social connections:    Talks on phone: Not on file    Gets together: Not on file    Attends religious service: Not on file    Active member of club or organization: Not on file    Attends meetings of clubs or organizations: Not on file    Relationship status: Not on file  Other Topics Concern  . Not on file  Social History Narrative  . Not on file   Family History: Family History  Problem Relation Age of Onset  . Dementia Mother   . Stroke Father   . COPD Father   . Heart failure Father   . Asthma Sister   . Heart attack Paternal Uncle   . Stroke Paternal Uncle    Allergies: No Known Allergies Medications: See med rec.  Review of Systems: No headache, visual changes, nausea, vomiting, diarrhea, constipation, dizziness, abdominal pain, skin rash, fevers, chills, night sweats, weight loss, swollen lymph nodes, body aches, joint swelling, muscle aches, chest pain, shortness of breath, mood changes, visual or auditory hallucinations.  Objective:   General: Well Developed, well nourished, and in no acute distress.  Neuro:  Extra-ocular muscles intact, able to move all 4 extremities, sensation grossly intact.  Deep tendon reflexes tested were normal. Psych: Alert and oriented, mood congruent with affect. ENT:  Ears and nose appear unremarkable.  Hearing grossly normal. Neck: Unremarkable overall appearance, trachea midline.  No visible thyroid enlargement. Eyes: Conjunctivae and lids appear unremarkable.  Pupils equal and round. Skin: Warm and dry, no rashes noted.  Cardiovascular: Pulses palpable, no extremity edema. Left foot: No visible erythema or swelling. Range of motion is  full in all directions. Strength is 5/5 in all directions. No hallux valgus. No pes cavus or pes planus. No abnormal callus noted. No pain over the navicular prominence, or base of fifth metatarsal. No tenderness to palpation of the calcaneal insertion of plantar fascia. Moderate pain at the Achilles insertion. No pain over the calcaneal bursa. No pain of the retrocalcaneal bursa. No tenderness to palpation over the tarsals, metatarsals, or phalanges. No hallux rigidus or limitus. No tenderness palpation over interphalangeal joints. No pain with compression of the metatarsal heads. Neurovascularly intact distally. He does have significant thickening, yellowing and crusting consistent with tinea pedis Bilateral knees: Normal to inspection with no erythema or effusion or obvious bony abnormalities. Tender to palpation over the patella facets ROM normal in flexion and extension and lower leg rotation. Ligaments with solid consistent endpoints including ACL, PCL, LCL, MCL. Negative Mcmurray's and provocative meniscal tests. Non painful patellar compression. Patellar and quadriceps tendons unremarkable. Hamstring and quadriceps strength is normal. Left shoulder: Inspection reveals no abnormalities, atrophy or asymmetry. Palpation is normal with no tenderness over AC joint or bicipital groove. ROM is full in all planes. Rotator cuff strength normal throughout. Positive Neer and Hawkin's tests, empty can. Specifically a positive liftoff sign Speeds and Yergason's tests normal. No labral pathology noted with negative Obrien's, negative crank, negative clunk, and good stability. Normal scapular function observed. No painful arc and no drop arm sign. No apprehension sign  Impression and Recommendations:   This case required medical decision making of moderate complexity.  Polyarthralgia Pains, left shoulder rotator cuff dysfunction. 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 Adding a bilateral heel lift. Lumbar DDD. Bilateral knee osteoarthritis. X-rays of the involved structures, we are restarting Celebrex which will help significantly. I am going to have him do formal physical therapy, return to see me in 6 weeks.   Tinea pedis Fairly severe. Adding terbinafine, he will probably need 2 to 3 months of therapy. He will do aggressive pumice stone debridement every day. Follow this up with PCP, I would recommend rechecking LFTs in 1 to 2 months.  ___________________________________________ Ihor Austin. Benjamin Stain, M.D., ABFM., CAQSM. Primary Care and Sports Medicine [facility]  Adjunct Professor of Family Medicine  [employer]

## 2018-07-21 NOTE — Assessment & Plan Note (Signed)
Pains, left shoulder rotator cuff dysfunction. 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 Adding a bilateral heel lift. Lumbar DDD. Bilateral knee osteoarthritis. X-rays of the involved structures, we are restarting Celebrex which will help significantly. I am going to have him do formal physical therapy, return to see me in 6 weeks.

## 2018-07-21 NOTE — Assessment & Plan Note (Signed)
Fairly severe. Adding terbinafine, he will probably need 2 to 3 months of therapy. He will do aggressive pumice stone debridement every day. Follow this up with PCP, I would recommend rechecking LFTs in 1 to 2 months.

## 2018-07-26 DIAGNOSIS — M150 Primary generalized (osteo)arthritis: Principal | ICD-10-CM

## 2018-07-26 DIAGNOSIS — M8949 Other hypertrophic osteoarthropathy, multiple sites: Secondary | ICD-10-CM | POA: Insufficient documentation

## 2018-07-26 DIAGNOSIS — M25572 Pain in left ankle and joints of left foot: Secondary | ICD-10-CM | POA: Insufficient documentation

## 2018-07-26 DIAGNOSIS — M159 Polyosteoarthritis, unspecified: Secondary | ICD-10-CM | POA: Insufficient documentation

## 2018-07-26 DIAGNOSIS — H6121 Impacted cerumen, right ear: Secondary | ICD-10-CM | POA: Insufficient documentation

## 2018-07-27 ENCOUNTER — Other Ambulatory Visit: Payer: Self-pay

## 2018-07-27 ENCOUNTER — Ambulatory Visit (INDEPENDENT_AMBULATORY_CARE_PROVIDER_SITE_OTHER): Payer: BLUE CROSS/BLUE SHIELD | Admitting: Physical Therapy

## 2018-07-27 ENCOUNTER — Encounter: Payer: Self-pay | Admitting: Physical Therapy

## 2018-07-27 DIAGNOSIS — M25572 Pain in left ankle and joints of left foot: Secondary | ICD-10-CM | POA: Diagnosis not present

## 2018-07-27 DIAGNOSIS — M25561 Pain in right knee: Secondary | ICD-10-CM | POA: Diagnosis not present

## 2018-07-27 DIAGNOSIS — M25562 Pain in left knee: Secondary | ICD-10-CM | POA: Diagnosis not present

## 2018-07-27 DIAGNOSIS — M545 Low back pain, unspecified: Secondary | ICD-10-CM

## 2018-07-27 DIAGNOSIS — G8929 Other chronic pain: Secondary | ICD-10-CM

## 2018-07-27 NOTE — Patient Instructions (Signed)
Access Code: [email or web address]  URL: [email or web address]  Date: 07/27/2018  Prepared by: Moshe Cipro   Exercises  Supine Hamstring Stretch with Strap - 3 reps - 1 sets - 30 sec hold - 2x daily - 7x weekly  Supine Piriformis Stretch with Foot on Ground - 3 reps - 1 sets - 30 sec hold - 2x daily - 7x weekly  Hooklying Single Knee to Chest - 3 reps - 1 sets - 30 sec hold - 2x daily - 7x weekly  Gastroc Stretch on Wall - 3 reps - 1 sets - 30 sec hold - 2x daily - 7x weekly  Seated Hip Flexor Stretch - 3 reps - 1 sets - 30 sec hold - 2x daily - 7x weekly  Standing Glute Med Mobilization with Small Ball on Wall - 2 reps - 1 sets - 2-3 min hold - 2x daily - 7x weekly  Patient Education  Trigger Point Dry Needling

## 2018-07-27 NOTE — Therapy (Signed)
[facility] [place] [address] [place], [place], [address] Phone: [phone]   Fax:  [phone]  Physical Therapy Evaluation  Patient Details  Name: Adrian [name] MRN: [id number] Date of Birth: 1960/09/01 Referring Provider (PT): Monica Becton, MD   Encounter Date: 07/27/2018  PT End of Session - 07/27/18 0937    Visit Number  1    Number of Visits  6    Date for PT Re-Evaluation  09/07/18    Authorization Type  BCBS    PT Start Time  [phone]    PT Stop Time  0927    PT Time Calculation (min)  45 min    Activity Tolerance  Patient tolerated treatment well    Behavior During Therapy  [facility] for tasks assessed/performed       Past Medical History:  Diagnosis Date  . Chronic right hip pain 08/06/2017  . Chronic right-sided low back pain without sciatica 08/06/2017  . GERD (gastroesophageal reflux disease)   . Hypertension   . Hypertriglyceridemia 08/06/2017  . Seasonal allergies     Past Surgical History:  Procedure Laterality Date  . ESOPHAGOGASTRODUODENOSCOPY    . NO PAST SURGERIES      There were no vitals filed for this visit.   Subjective Assessment - 07/27/18 0844    Subjective  Pt is a 58 y/o male who presents to OPPT for increased pain in back, hips, and bil knees as well as Lt ankle pain.  Pt reports pain began when he ran out of Celebrex.  Pt without known cause of pain and no specific injury reported.  Pt reports remote hx of tick bits ~ 2 years ago, but forgot to mention to MD.  Pt states that now back on Celebrex, pain in all areas have improved.    Diagnostic tests  xrays: bil knee OA    Patient Stated Goals  "I was following doctor's orders"  not sure how PT will help with arthritis    Currently in Pain?  Yes    Pain Score  0-No pain    Pain Location  Back    Pain Orientation  Lower;Left    Pain Descriptors / Indicators  Discomfort    Pain Type  Chronic pain    Pain Onset  More than a month ago     Pain Frequency  Intermittent    Aggravating Factors   no meds    Pain Relieving Factors  Celebrex    Multiple Pain Sites  Yes    Pain Score  0   up to 3/10   Pain Location  Knee    Pain Orientation  Right;Left    Aggravating Factors   stairs, walking    Pain Relieving Factors  Celebrex    Pain Score  0   up to 2/10   Pain Location  Ankle    Pain Orientation  Left    Pain Type  Acute pain    Pain Onset  More than a month ago    Pain Frequency  Intermittent    Aggravating Factors   pressing on area of pain    Pain Relieving Factors  Celebrex         [facility] PT Assessment - 07/27/18 0852      Assessment   Medical Diagnosis  M25.50 (ICD-10-CM) - Polyarthralgia    Referring Provider (PT)  Monica Becton, MD    Onset Date/Surgical Date  06/26/18   approx   Hand  Dominance  Right    Next MD Visit  09/01/2018    Prior Therapy  at this clinic      Precautions   Precautions  None      Restrictions   Weight Bearing Restrictions  No      Balance Screen   Has the patient fallen in the past 6 months  No    Has the patient had a decrease in activity level because of a fear of falling?   No    Is the patient reluctant to leave their home because of a fear of falling?   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; supervising      Cognition   Overall Cognitive Status  Within Functional Limits for tasks assessed      Observation/Other Assessments   Focus on Therapeutic Outcomes (FOTO)   79 (21% limited; predicted 22% limited)      Posture/Postural Control   Posture/Postural Control  Postural limitations    Postural Limitations  Rounded  Shoulders;Forward head      ROM / Strength   AROM / PROM / Strength  AROM;Strength      AROM   Overall AROM Comments  lumbar ROM WNL with mild increase in pain, bil knees WNL      Strength   Strength Assessment Site  Hip;Knee;Ankle    Right/Left Hip  Right;Left    Right Hip Flexion  5/5    Right Hip Extension  4/5    Right Hip ABduction  4/5    Left Hip Flexion  5/5    Left Hip Extension  4/5    Left Hip ABduction  4/5    Right/Left Knee  Right;Left    Right Knee Flexion  4/5    Right Knee Extension  5/5    Left Knee Flexion  4/5    Left Knee Extension  5/5    Right/Left Ankle  Right;Left    Right Ankle Dorsiflexion  5/5    Left Ankle Dorsiflexion  5/5      Flexibility   Soft Tissue Assessment /Muscle Length  yes    Hamstrings  tightness bil   tightness in bil hip flexors   Piriformis  tightness bil      Palpation   Palpation comment  trigger points noted in Lt glute med      Special Tests    Special Tests  Lumbar    Lumbar Tests  Straight Leg Raise      Straight Leg Raise   Findings  Negative                Objective measurements completed on examination: See above findings.      [facility] Adult PT Treatment/Exercise - 07/27/18 0852      Exercises   Exercises  Lumbar      Lumbar Exercises: Stretches   Passive Hamstring Stretch  Right;Left;1 rep;30 seconds    Single Knee to Chest Stretch  Right;Left;1 rep;30 seconds    Hip Flexor Stretch  Right;Left;1 rep;30 seconds  Hip Flexor Stretch Limitations  seated    Piriformis Stretch  Right;Left;1 rep;30 seconds    Gastroc Stretch  Right;Left;1 rep;30 seconds             PT Education - 07/27/18 0936    Education Details  HEP, DN, clinical findings, POC    Person(s) Educated  Patient    Methods  Explanation;Demonstration;Handout    Comprehension  Verbalized understanding;Returned demonstration;Need further instruction          PT Long Term Goals - 07/27/18 0941      PT LONG TERM GOAL #1    Title  independent with HEP    Status  New    Target Date  09/07/18      PT LONG TERM GOAL #2   Title  improve bilat hip strength =/> 5-/5 to support body work and recreational activities    Status  New    Target Date  09/07/18      PT LONG TERM GOAL #3   Title  maintain FOTO score    Status  New    Target Date  09/07/18      PT LONG TERM GOAL #4   Title  report ability to negotiate stairs at work with pain < 2/10 consistently for improved function    Status  New    Target Date  09/07/18      PT LONG TERM GOAL #5   Title  n/a             Plan - 07/27/18 0937    Clinical Impression Statement  Pt is a 58 y/o male who presents to OPPT with c/o generalized pain and polyarthralgia.  Pt requested to focus on low back and knees today and can address other areas of pain PRN.  Pt demonstrates decreased flexibility, trigger points, and mild strength liimtations affecting functional mobility.  Pt will benefit from PT to maximize function and address deficits listed.  Pt reports hx of tick bites ~ 2 years ago, and forgot to mention to MD.  Advised pt to let MD know to see if further testing would need to be completed.    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    Clinical Decision Making  Moderate    Rehab Potential  Good    PT Frequency  1x / week    PT Duration  6 weeks    PT Treatment/Interventions  ADLs/Self Care Home Management;Cryotherapy;Ultrasound;Moist Heat;Traction;Electrical Stimulation;Gait training;Stair training;Functional mobility training;Therapeutic exercise;Therapeutic activities;Patient/family education;Manual techniques;Dry needling;Taping    PT Next Visit Plan  review  HEP, core/hip strengthening, manual/modalities PRN, DN to Lt glute med    PT Home Exercise Plan  Access Code: [email or web address]    Consulted and Agree with Plan of Care  Patient       Patient will benefit from skilled therapeutic intervention in order to improve the following deficits and impairments:  Increased fascial restricitons, Increased muscle spasms, Pain, Postural dysfunction, Decreased strength, Impaired flexibility  Visit Diagnosis: Chronic left-sided low back pain without sciatica - Plan: PT plan of care cert/re-cert  Chronic pain of left knee - Plan: PT plan of care cert/re-cert  Chronic pain of right knee - Plan: PT plan of care cert/re-cert  Pain in left ankle and joints of left  foot - Plan: PT plan of care cert/re-cert     Problem List Patient Active Problem List   Diagnosis Date Noted  . Primary osteoarthritis involving multiple joints 07/26/2018  . Acute left ankle pain 07/26/2018  . Cerumen debris on tympanic membrane of right ear 07/26/2018  . Tinea pedis 07/21/2018  . Periscapular pain 07/20/2018  . Retrocalcaneal bursitis, left 07/20/2018  . History of hepatitis B virus infection 07/20/2018  . Polyarthralgia 07/20/2018  . At risk for obstructive sleep apnea 07/20/2018  . Hearing loss due to cerumen impaction, right 11/02/2017  . Seasonal allergic rhinitis due to pollen 11/02/2017  . Fasting hyperglycemia 08/06/2017  . Primary osteoarthritis of right hip 08/06/2017  . Chronic right-sided low back pain without sciatica 08/06/2017  . Hypertriglyceridemia 08/06/2017  . 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  . Gastroesophageal reflux disease without esophagitis 07/09/2017  . Hypertension goal BP (blood pressure) < 130/80 07/09/2017  . Class 2 obesity due to excess calories in adult 07/09/2017      Clarita Crane, PT, DPT 07/27/18 9:44 AM     [facility] [address] [place], [place],  [address] Phone: [phone]   Fax:  [phone]  Name: Adrian [name] MRN: [id number] Date of Birth: [birth year]

## 2018-07-30 DIAGNOSIS — H9319 Tinnitus, unspecified ear: Secondary | ICD-10-CM | POA: Diagnosis not present

## 2018-07-30 DIAGNOSIS — H6090 Unspecified otitis externa, unspecified ear: Secondary | ICD-10-CM | POA: Diagnosis not present

## 2018-07-30 DIAGNOSIS — J3489 Other specified disorders of nose and nasal sinuses: Secondary | ICD-10-CM | POA: Diagnosis not present

## 2018-07-30 DIAGNOSIS — H93293 Other abnormal auditory perceptions, bilateral: Secondary | ICD-10-CM | POA: Diagnosis not present

## 2018-08-03 ENCOUNTER — Ambulatory Visit: Payer: BLUE CROSS/BLUE SHIELD | Admitting: Physical Therapy

## 2018-08-03 ENCOUNTER — Encounter: Payer: Self-pay | Admitting: Physical Therapy

## 2018-08-03 DIAGNOSIS — M25561 Pain in right knee: Secondary | ICD-10-CM

## 2018-08-03 DIAGNOSIS — M545 Low back pain, unspecified: Secondary | ICD-10-CM

## 2018-08-03 DIAGNOSIS — M25572 Pain in left ankle and joints of left foot: Secondary | ICD-10-CM | POA: Diagnosis not present

## 2018-08-03 DIAGNOSIS — M25551 Pain in right hip: Secondary | ICD-10-CM

## 2018-08-03 DIAGNOSIS — M25562 Pain in left knee: Secondary | ICD-10-CM

## 2018-08-03 DIAGNOSIS — M5441 Lumbago with sciatica, right side: Secondary | ICD-10-CM

## 2018-08-03 DIAGNOSIS — G8929 Other chronic pain: Secondary | ICD-10-CM

## 2018-08-03 DIAGNOSIS — M6281 Muscle weakness (generalized): Secondary | ICD-10-CM

## 2018-08-03 NOTE — Therapy (Signed)
[facility] [place] [address] [place], [place], [address] Phone: [phone]   Fax:  [phone]  Physical Therapy Treatment  Patient Details  Name: Adrian [name] MRN: [id number] Date of Birth: 10-11-1960 Referring Provider (PT): Monica Becton, MD   Encounter Date: 08/03/2018  PT End of Session - 08/03/18 0944    Visit Number  2    Number of Visits  6    Date for PT Re-Evaluation  09/07/18    Authorization Type  BCBS    PT Start Time  0845    PT Stop Time  0928    PT Time Calculation (min)  43 min    Activity Tolerance  Patient tolerated treatment well    Behavior During Therapy  [facility] for tasks assessed/performed       Past Medical History:  Diagnosis Date  . Chronic right hip pain 08/06/2017  . Chronic right-sided low back pain without sciatica 08/06/2017  . GERD (gastroesophageal reflux disease)   . Hypertension   . Hypertriglyceridemia 08/06/2017  . Seasonal allergies     Past Surgical History:  Procedure Laterality Date  . ESOPHAGOGASTRODUODENOSCOPY    . NO PAST SURGERIES      There were no vitals filed for this visit.  Subjective Assessment - 08/03/18 0845    Subjective  "you're going to fuss at me." didn't do exercises    Diagnostic tests  xrays: bil knee OA    Patient Stated Goals  "I was following doctor's orders"  not sure how PT will help with arthritis    Currently in Pain?  Yes    Pain Score  1    Pain Location  Knee    Pain Orientation  Right;Left                       [facility] Adult PT Treatment/Exercise - 08/03/18 0848      Lumbar Exercises: Stretches   Passive Hamstring Stretch  Right;Left;2 reps;30 seconds    Passive Hamstring Stretch Limitations  supine with strap    Single Knee to Chest Stretch  Right;Left;2 reps;30 seconds    Hip Flexor Stretch  Right;Left;2 reps;30 seconds    Hip Flexor Stretch Limitations  seated    Piriformis Stretch  Right;Left;2 reps;30  seconds    Gastroc Stretch  Right;Left;2 reps;30 seconds      Lumbar Exercises: Aerobic   Nustep  L5 x 6 min      Manual Therapy   Manual Therapy  Soft tissue mobilization    Manual therapy comments  pt prone; skilled palpation and monitoring of soft tissue during DN    Soft tissue mobilization  STM to Lt glutes with deep pressure to release the pirifomris       Trigger Point Dry Needling - 08/03/18 0943    Consent Given?  Yes    Education Handout Provided  Yes    Muscles Treated Back/Hip  Gluteus medius;Gluteus maximus    Gluteus Medius Response  Twitch response elicited;Palpable increased muscle length    Gluteus Maximus Response  Twitch response elicited;Palpable increased muscle length                PT Long Term Goals - 07/27/18 0941      PT LONG TERM GOAL #1   Title  independent with HEP    Status  New    Target Date  09/07/18      PT LONG TERM GOAL #2  Title  improve bilat hip strength =/> 5-/5 to support body work and recreational activities    Status  New    Target Date  09/07/18      PT LONG TERM GOAL #3   Title  maintain FOTO score    Status  New    Target Date  09/07/18      PT LONG TERM GOAL #4   Title  report ability to negotiate stairs at work with pain < 2/10 consistently for improved function    Status  New    Target Date  09/07/18      PT LONG TERM GOAL #5   Title  n/a            Plan - 08/03/18 0944    Clinical Impression Statement  Pt reports min changes to symptoms since eval, and has not been compliant with exercises provided.  Recommended increased compliance with exercises to ensure carryover and positive response to DN and manual therapy today.  Will continue to benefit from PT to maximize function.    Personal Factors and Comorbidities  Time since onset of injury/illness/exacerbation;Comorbidity 1    Comorbidities  OA    Examination-Activity Limitations  Stairs;Stand;Locomotion Level    Examination-Participation  Restrictions  Other;Yard Work   work Tax inspector complexity    Rehab Potential  Good    PT Frequency  1x / week    PT Duration  6 weeks    PT Treatment/Interventions  ADLs/Self Care Home Management;Cryotherapy;Ultrasound;Moist Heat;Traction;Electrical Stimulation;Gait training;Stair training;Functional mobility training;Therapeutic exercise;Therapeutic activities;Patient/family education;Manual techniques;Dry needling;Taping    PT Next Visit Plan  review HEP, core/hip strengthening, manual/modalities PRN, assess response to DN to Lt glute med    PT Home Exercise Plan  Access Code: [email or web address]    Consulted and Agree with Plan of Care  Patient       Patient will benefit from skilled therapeutic intervention in order to improve the following deficits and impairments:  Increased fascial restricitons, Increased muscle spasms, Pain, Postural dysfunction, Decreased strength, Impaired flexibility  Visit Diagnosis: Chronic left-sided low back pain without sciatica  Chronic pain of left knee  Chronic pain of right knee  Pain in left ankle and joints of left foot  Chronic right-sided low back pain with right-sided sciatica  Pain in right hip  Muscle weakness (generalized)     Problem List Patient Active Problem List   Diagnosis Date Noted  . Primary osteoarthritis involving multiple joints 07/26/2018  . Acute left ankle pain 07/26/2018  . Cerumen debris on tympanic membrane of right ear 07/26/2018  . Tinea pedis 07/21/2018  . Periscapular pain 07/20/2018  . Retrocalcaneal bursitis, left 07/20/2018  . History of hepatitis B virus infection 07/20/2018  . Polyarthralgia 07/20/2018  . At risk for obstructive sleep apnea 07/20/2018  . Hearing loss due to cerumen impaction, right 11/02/2017  . Seasonal allergic rhinitis due to pollen 11/02/2017  . Fasting hyperglycemia 08/06/2017  . Primary osteoarthritis of right hip 08/06/2017   . Chronic right-sided low back pain without sciatica 08/06/2017  . Hypertriglyceridemia 08/06/2017  . 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  . Gastroesophageal reflux disease without esophagitis 07/09/2017  . Hypertension goal BP (blood pressure) < 130/80 07/09/2017  . Class 2 obesity due to excess calories in adult 07/09/2017      Clarita Crane, PT, DPT 08/03/18 9:46 AM     [facility] [address] [place] [address] [place], [place], [address] Phone:  [phone]   Fax:  [phone]  Name: Adrian [name] MRN: [id number] Date of Birth: 1960/11/20

## 2018-08-11 ENCOUNTER — Other Ambulatory Visit: Payer: Self-pay

## 2018-08-11 ENCOUNTER — Ambulatory Visit: Payer: BLUE CROSS/BLUE SHIELD | Admitting: Physical Therapy

## 2018-08-11 ENCOUNTER — Encounter: Payer: Self-pay | Admitting: Physical Therapy

## 2018-08-11 DIAGNOSIS — M25561 Pain in right knee: Secondary | ICD-10-CM

## 2018-08-11 DIAGNOSIS — M25562 Pain in left knee: Secondary | ICD-10-CM | POA: Diagnosis not present

## 2018-08-11 DIAGNOSIS — M25572 Pain in left ankle and joints of left foot: Secondary | ICD-10-CM | POA: Diagnosis not present

## 2018-08-11 DIAGNOSIS — M545 Low back pain, unspecified: Secondary | ICD-10-CM

## 2018-08-11 DIAGNOSIS — G8929 Other chronic pain: Secondary | ICD-10-CM

## 2018-08-11 NOTE — Therapy (Addendum)
[place], [place], [address] Phone: [phone]   Fax:  [phone]  Physical Therapy Treatment/Discharge  Patient Details  Name: Adrian [name] MRN: [id number] Date of Birth: Dec 12, 1960 Referring Provider (PT): Silverio Decamp, MD   Encounter Date: 08/11/2018  PT End of Session - 08/11/18 1215    Visit Number  3    Number of Visits  6    Date for PT Re-Evaluation  09/07/18    Authorization Type  BCBS    PT Start Time  0845    PT Stop Time  0928    PT Time Calculation (min)  43 min    Activity Tolerance  Patient tolerated treatment well    Behavior During Therapy  [facility] for tasks assessed/performed       Past Medical History:  Diagnosis Date  . Chronic right hip pain 08/06/2017  . Chronic right-sided low back pain without sciatica 08/06/2017  . GERD (gastroesophageal reflux disease)   . Hypertension   . Hypertriglyceridemia 08/06/2017  . Seasonal allergies     Past Surgical History:  Procedure Laterality Date  . ESOPHAGOGASTRODUODENOSCOPY    . NO PAST SURGERIES      There were no vitals filed for this visit.  Subjective Assessment - 08/11/18 1211    Subjective  Pt states much improved pain in glute and back in last few days. He has been trying to do exercises daily. He is not currently working due to [facility] being closed temporarily.     Currently in Pain?  Yes    Pain Score  2     Pain Location  Back    Pain Orientation  Left;Lower    Pain Descriptors / Indicators  Tightness;Discomfort    Pain Type  Chronic pain    Pain Onset  More than a month ago    Pain Frequency  Intermittent    Multiple Pain Sites  Yes    Pain Score  2    Pain Location  Knee    Pain Orientation  Right;Left    Pain Descriptors / Indicators  Aching    Pain Type  Chronic pain    Pain Onset  More than a month ago    Pain Frequency  Intermittent                       [facility] Adult PT  Treatment/Exercise - 08/11/18 0840      Lumbar Exercises: Stretches   Passive Hamstring Stretch  Right;Left;2 reps;30 seconds    Passive Hamstring Stretch Limitations  setaed    Single Knee to Chest Stretch  Right;Left;2 reps;30 seconds    Hip Flexor Stretch  --    Hip Flexor Stretch Limitations  --    Piriformis Stretch  Right;Left;2 reps;30 seconds    Piriformis Stretch Limitations  seated fig 4      Lumbar Exercises: Aerobic   Nustep  L 5  x 8  min      Lumbar Exercises: Standing   Other Standing Lumbar Exercises  Hip Abd 2 x10 bil;       Lumbar Exercises: Supine   Clam  20 reps    Clam Limitations  Bl TB    Bent Knee Raise  20 reps    Bent Knee Raise Limitations  with TA    Bridge  20 reps      Manual Therapy   Manual Therapy  Soft tissue mobilization  Manual therapy comments  pt S/L; skilled palpation and monitoring of soft tissue during DN    Soft tissue mobilization  STM to Lt glute region       Trigger Point Dry Needling - 08/11/18 0001    Consent Given?  Yes    Muscles Treated Back/Hip  Gluteus medius;Gluteus maximus;Piriformis    Gluteus Medius Response  Palpable increased muscle length    Gluteus Maximus Response  Palpable increased muscle length    Piriformis Response  Palpable increased muscle length           PT Education - 08/11/18 1217    Education Details  discussed importance of HEP     Person(s) Educated  Patient    Methods  Explanation    Comprehension  Verbalized understanding;Returned demonstration;Need further instruction          PT Long Term Goals - 07/27/18 0941      PT LONG TERM GOAL #1   Title  independent with HEP    Status  New    Target Date  09/07/18      PT LONG TERM GOAL #2   Title  improve bilat hip strength =/> 5-/5 to support body work and recreational activities    Status  New    Target Date  09/07/18      PT LONG TERM GOAL #3   Title  maintain FOTO score    Status  New    Target Date  09/07/18      PT  LONG TERM GOAL #4   Title  report ability to negotiate stairs at work with pain < 2/10 consistently for improved function    Status  New    Target Date  09/07/18      PT LONG TERM GOAL #5   Title  n/a            Plan - 08/11/18 1220    Clinical Impression Statement  Pt with soreness in L glute/ piroformis region, addressed with manual therapy and dry needling today. Pt with improved pain today overall, so core and hip strengthening prgressed as tolerated. Plan to progress as tolerated. Discussed importance of HEP.     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    Rehab Potential  Good    PT Frequency  1x / week    PT Duration  6 weeks    PT Treatment/Interventions  ADLs/Self Care Home Management;Cryotherapy;Ultrasound;Moist Heat;Traction;Electrical Stimulation;Gait training;Stair training;Functional mobility training;Therapeutic exercise;Therapeutic activities;Patient/family education;Manual techniques;Dry needling;Taping    PT Next Visit Plan  review HEP, core/hip strengthening, manual/modalities PRN, assess response to DN to Lt glute med    PT Home Exercise Plan  Access Code: [email or web address]    Consulted and Agree with Plan of Care  Patient       Patient will benefit from skilled therapeutic intervention in order to improve the following deficits and impairments:  Increased fascial restricitons, Increased muscle spasms, Pain, Postural dysfunction, Decreased strength, Impaired flexibility  Visit Diagnosis: Chronic left-sided low back pain without sciatica  Chronic pain of left knee  Chronic pain of right knee  Pain in left ankle and joints of left foot     Problem List Patient Active  Problem List   Diagnosis Date Noted  . Primary osteoarthritis  involving multiple joints 07/26/2018  . Acute left ankle pain 07/26/2018  . Cerumen debris on tympanic membrane of right ear 07/26/2018  . Tinea pedis 07/21/2018  . Periscapular pain 07/20/2018  . Retrocalcaneal bursitis, left 07/20/2018  . History of hepatitis B virus infection 07/20/2018  . Polyarthralgia 07/20/2018  . At risk for obstructive sleep apnea 07/20/2018  . Hearing loss due to cerumen impaction, right 11/02/2017  . Seasonal allergic rhinitis due to pollen 11/02/2017  . Fasting hyperglycemia 08/06/2017  . Primary osteoarthritis of right hip 08/06/2017  . Chronic right-sided low back pain without sciatica 08/06/2017  . Hypertriglyceridemia 08/06/2017  . 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  . Gastroesophageal reflux disease without esophagitis 07/09/2017  . Hypertension goal BP (blood pressure) < 130/80 07/09/2017  . Class 2 obesity due to excess calories in adult 07/09/2017    Lyndee Hensen, PT, DPT 12:36 PM  08/11/18    [facility] [place], [place], [address] Phone: [phone]   Fax:  [phone]  Name: TEREL BANN MRN: [id number] Date of Birth: May 05, 1961      PHYSICAL THERAPY DISCHARGE SUMMARY  Visits from Start of Care: 3  Current functional level related to goals / functional outcomes: See above   Remaining deficits: N/A; spoke with pt via telephone and he reports low back is doing very well and responded well to DN sessions.  Pt requested d/c at this time.     Education / Equipment: HEP, DN  Plan: Patient agrees to discharge.  Patient goals were not met. Patient is being discharged due to being pleased with the current functional level.  ?????    Laureen Abrahams, PT, DPT 09/08/18 2:14 PM  [facility] [place], [place]  [address]  [phone] (office) [phone] (fax)

## 2018-08-13 ENCOUNTER — Telehealth: Payer: Self-pay | Admitting: Physical Therapy

## 2018-08-13 NOTE — Telephone Encounter (Signed)
LVM for pt due to clinic closure to reschedule.  Advised to call office for any questions.  Ashan Cueva F Katriona Schmierer, PT, DPT 08/13/18 8:20 PM 

## 2018-08-18 ENCOUNTER — Encounter: Payer: BLUE CROSS/BLUE SHIELD | Admitting: Physical Therapy

## 2018-08-24 ENCOUNTER — Encounter: Payer: BLUE CROSS/BLUE SHIELD | Admitting: Physical Therapy

## 2018-08-27 ENCOUNTER — Telehealth: Payer: Self-pay | Admitting: Physician Assistant

## 2018-08-27 NOTE — Telephone Encounter (Signed)
Talked to PT on the phone.   He is having concerns about the medications he is taking and there effects on his liver.(Celebrex)  If talk is extensive we can put him down for a video visit.   Please Advise.

## 2018-09-01 ENCOUNTER — Ambulatory Visit: Payer: BLUE CROSS/BLUE SHIELD | Admitting: Sports Medicine

## 2018-09-01 NOTE — Telephone Encounter (Signed)
I'm thinking pt is walking about Lamisil affecting his liver, not Celebrex.  Please advise.

## 2018-09-02 NOTE — Telephone Encounter (Signed)
We could certainly do a telephone visit to discuss this, and ultimately I could get some liver function tests to allay his fears.

## 2018-09-02 NOTE — Telephone Encounter (Signed)
Left pt msg advising to call office and get scheduled for telephone visit with Dr Karie Schwalbe

## 2018-09-06 ENCOUNTER — Encounter: Payer: Self-pay | Admitting: Sports Medicine

## 2018-09-06 ENCOUNTER — Ambulatory Visit (INDEPENDENT_AMBULATORY_CARE_PROVIDER_SITE_OTHER): Payer: BLUE CROSS/BLUE SHIELD | Admitting: Sports Medicine

## 2018-09-06 DIAGNOSIS — R768 Other specified abnormal immunological findings in serum: Secondary | ICD-10-CM | POA: Diagnosis not present

## 2018-09-06 DIAGNOSIS — M2550 Pain in unspecified joint: Secondary | ICD-10-CM | POA: Diagnosis not present

## 2018-09-06 DIAGNOSIS — B353 Tinea pedis: Secondary | ICD-10-CM | POA: Diagnosis not present

## 2018-09-06 DIAGNOSIS — R7689 Other specified abnormal immunological findings in serum: Secondary | ICD-10-CM

## 2018-09-06 NOTE — Assessment & Plan Note (Addendum)
Pains, left shoulder rotator cuff dysfunction. 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 Adding a bilateral heel lift. Lumbar DDD. Bilateral knee osteoarthritis. X-rays of the involved structures, we are restarting Celebrex which will help significantly. I am going to have him do formal physical therapy, return to see me in 6 weeks.  He does tell me now that he is doing much better with Celebrex. He did have several tick bites, adding tickborne serologies for berylliosis, rickettsial disease, ehrlichiosis, and babesiosis, since we need blood work anyway.

## 2018-09-06 NOTE — Progress Notes (Signed)
Virtual Visit via Telephone   I connected with  Adrian [name]  on 09/06/18 by telephone and verified that I am speaking with the correct person using two identifiers.   I discussed the limitations, risks, security and privacy concerns of performing an evaluation and management service by telephone, including the higher likelihood of inaccurate diagnosis and treatment, and the availability of in person appointments.  We also discussed the likely need of an additional face to face encounter for complete and high quality delivery of care.  I also discussed with the patient that there may be a patient responsible charge related to this service. The patient expressed understanding and wishes to proceed.  Subjective:    CC: Several questions  HPI: Polyarthralgia: Endorses that he did have a few tick bites, agreeable to proceed with tickborne serology testing, he does have polyarthralgias though there is some osteoarthritis on x-rays.  Positive hepatitis core antibody: Suspects exposure when he was in the [employer], LFTs were normal, bilirubin was normal, he does have evidence of immunity with a positive hepatitis B surface antibody.  Onychomycosis: With tinea pedis, needs to continue Lamisil, sanitizing of his showers, clean socks in hot water and keep feet dry.  I reviewed the past medical history, family history, social history, surgical history, and allergies today and no changes were needed.  Please see the problem list section below in epic for further details.  Past Medical History: Past Medical History:  Diagnosis Date  . Chronic right hip pain 08/06/2017  . Chronic right-sided low back pain without sciatica 08/06/2017  . GERD (gastroesophageal reflux disease)   . Hypertension   . Hypertriglyceridemia 08/06/2017  . Seasonal allergies    Past Surgical History: Past Surgical History:  Procedure Laterality Date  . ESOPHAGOGASTRODUODENOSCOPY    . NO PAST SURGERIES     Social  History: Social History   Socioeconomic History  . Marital status: Married    Spouse name: Not on file  . Number of children: Not on file  . Years of education: Not on file  . Highest education level: Not on file  Occupational History  . Not on file  Social Needs  . Financial resource strain: Not on file  . Food insecurity:    Worry: Not on file    Inability: Not on file  . Transportation needs:    Medical: Not on file    Non-medical: Not on file  Tobacco Use  . Smoking status: Never Smoker  . Smokeless tobacco: Never Used  Substance and Sexual Activity  . Alcohol use: No    Frequency: Never  . Drug use: No  . Sexual activity: Yes    Birth control/protection: None  Lifestyle  . Physical activity:    Days per week: Not on file    Minutes per session: Not on file  . Stress: Not on file  Relationships  . Social connections:    Talks on phone: Not on file    Gets together: Not on file    Attends religious service: Not on file    Active member of club or organization: Not on file    Attends meetings of clubs or organizations: Not on file    Relationship status: Not on file  Other Topics Concern  . Not on file  Social History Narrative  . Not on file   Family History: Family History  Problem Relation Age of Onset  . Dementia Mother   . Stroke Father   . COPD  Father   . Heart failure Father   . Asthma Sister   . Heart attack Paternal Uncle   . Stroke Paternal Uncle    Allergies: No Known Allergies Medications: See med rec.  Review of Systems: No fevers, chills, night sweats, weight loss, chest pain, or shortness of breath.   Objective:    General: Speaking full sentences, no audible heavy breathing.  Sounds alert and appropriately interactive.  No other physical exam performed due to the non-face to face nature of this visit.  Impression and Recommendations:    Hepatitis B core antibody positive Adding hepatitis B DNA quantification and repeat core  antibody testing to ensure that this was not a false positive.  Polyarthralgia Pains, left shoulder rotator cuff dysfunction. 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 Adding a bilateral heel lift. Lumbar DDD. Bilateral knee osteoarthritis. X-rays of the involved structures, we are restarting Celebrex which will help significantly. I am going to have him do formal physical therapy, return to see me in 6 weeks.  He does tell me now that he is doing much better with Celebrex. He did have several tick bites, adding tickborne serologies for berylliosis, rickettsial disease, ehrlichiosis, and babesiosis, since we need blood work anyway.  Tinea pedis Fairly severe, continues to improve with oral terbinafine, we did suspect at least 3 months of therapy would be needed. He is taking care to sanitize things that contact his feet, and he is doing aggressive pumice stone debridement daily.   I discussed the above assessment and treatment plan with the patient. The patient was provided an opportunity to ask questions and all were answered. The patient agreed with the plan and demonstrated an understanding of the instructions.   The patient was advised to call back or seek an in-person evaluation if the symptoms worsen or if the condition fails to improve as anticipated.   I provided 21 minutes of non-face-to-face time during this encounter, less than 50% of this was time needed to gather information, review chart, records, and complete documentation.   ___________________________________________ Ihor Austinhomas J. Benjamin Stainhekkekandam, M.D., ABFM., CAQSM. Primary Care and Sports Medicine [facility]  Adjunct Professor of Family Medicine  [employer]

## 2018-09-06 NOTE — Assessment & Plan Note (Signed)
Adding hepatitis B DNA quantification and repeat core antibody testing to ensure that this was not a false positive.

## 2018-09-06 NOTE — Assessment & Plan Note (Signed)
Fairly severe, continues to improve with oral terbinafine, we did suspect at least 3 months of therapy would be needed. He is taking care to sanitize things that contact his feet, and he is doing aggressive pumice stone debridement daily.

## 2018-09-10 LAB — HEPATITIS B DNA, ULTRAQUANTITATIVE, PCR
Hepatitis B DNA (Calc): <1 Log IU/mL
Hepatitis B DNA: <10 IU/mL

## 2018-09-10 LAB — HEPATITIS B CORE ANTIBODY, TOTAL, WITH REFLEX TO IGM: Hep B Core Total Ab: REACTIVE — AB

## 2018-09-10 LAB — ROCKY MTN SPOTTED FVR ABS PNL(IGG+IGM)
RMSF IgG: NOT DETECTED
RMSF IgM: NOT DETECTED

## 2018-09-10 LAB — B. BURGDORFI ANTIBODIES: B burgdorferi Ab IgG+IgM: <0.9 index

## 2018-09-10 LAB — EHRLICHIA ANTIBODY PANEL
E. CHAFFEENSIS AB IGG: <1:64 {titer}
E. CHAFFEENSIS AB IGM: <1:20 {titer}

## 2018-09-10 LAB — BABESIA MICROTI ANTIBODIES IGG, IGM
Babesia microti IgG: <1:64 {titer}
Babesia microti IgM: <1:20 {titer}

## 2018-09-10 LAB — HEPATITIS B CORE ANTIBODY, IGM: Hep B C IgM: NONREACTIVE

## 2018-09-18 ENCOUNTER — Other Ambulatory Visit: Payer: Self-pay | Admitting: Physician Assistant

## 2018-09-18 DIAGNOSIS — I10 Essential (primary) hypertension: Secondary | ICD-10-CM

## 2018-09-21 ENCOUNTER — Other Ambulatory Visit: Payer: Self-pay | Admitting: Physician Assistant

## 2018-09-21 DIAGNOSIS — I10 Essential (primary) hypertension: Secondary | ICD-10-CM

## 2018-09-23 ENCOUNTER — Encounter: Payer: Self-pay | Admitting: Physician Assistant

## 2018-12-25 ENCOUNTER — Other Ambulatory Visit: Payer: Self-pay | Admitting: Physician Assistant

## 2018-12-25 DIAGNOSIS — I10 Essential (primary) hypertension: Secondary | ICD-10-CM

## 2019-01-04 ENCOUNTER — Other Ambulatory Visit: Payer: Self-pay

## 2019-01-04 ENCOUNTER — Encounter: Payer: Self-pay | Admitting: Physician Assistant

## 2019-01-04 ENCOUNTER — Ambulatory Visit (INDEPENDENT_AMBULATORY_CARE_PROVIDER_SITE_OTHER): Payer: BC Managed Care – PPO

## 2019-01-04 ENCOUNTER — Ambulatory Visit (INDEPENDENT_AMBULATORY_CARE_PROVIDER_SITE_OTHER): Payer: BC Managed Care – PPO | Admitting: Physician Assistant

## 2019-01-04 VITALS — BP 127/80 | HR 61 | Temp 98.7°F | Wt 236.0 lb

## 2019-01-04 DIAGNOSIS — M25532 Pain in left wrist: Secondary | ICD-10-CM

## 2019-01-04 DIAGNOSIS — M654 Radial styloid tenosynovitis [de Quervain]: Secondary | ICD-10-CM | POA: Diagnosis not present

## 2019-01-04 DIAGNOSIS — R609 Edema, unspecified: Secondary | ICD-10-CM | POA: Diagnosis not present

## 2019-01-04 DIAGNOSIS — G2581 Restless legs syndrome: Secondary | ICD-10-CM

## 2019-01-04 DIAGNOSIS — I10 Essential (primary) hypertension: Secondary | ICD-10-CM

## 2019-01-04 DIAGNOSIS — E785 Hyperlipidemia, unspecified: Secondary | ICD-10-CM | POA: Diagnosis not present

## 2019-01-04 DIAGNOSIS — R600 Localized edema: Secondary | ICD-10-CM

## 2019-01-04 MED ORDER — HYDROCHLOROTHIAZIDE 25 MG PO TABS: 25.0000 mg | ORAL_TABLET | Freq: Every day | ORAL | 5 refills | Status: DC

## 2019-01-04 NOTE — Progress Notes (Signed)
HPI:                                                                Adrian [name] is a 58 y.o. male who presents to [place]: [place] today for hypertension follow-up  HTN: taking Amlodipine 5 mg daily. Compliant with medications. Checks BP's at home. BP range 119-130's / 80's. Endorses bilateral leg swelling at the end of the day that has been worsening for the last 2 months. He reports he does a lot of walking, prolonged standing and climbing stairs for work. He states he has had a heaviness and restless leg sensation at night that interferes with sleep. He has tried self-massage, and this does calm them down. Denies vision change, headache, chest pain with exertion, orthopnea, change in exercise tolerance, lightheadedness, syncope. Risk factors include: male sex, age>55, obesity  Sleep study was ordered in February. Patient states he was contacted to schedule but then COVID-19 pandemic happened and he lost the phone number to re-schedule.  He also reports several weeks of left wrist pain (non-dominant hand). No known injury or trauma. No paresthesias or altered sensation.  Past Medical History:  Diagnosis Date  . Chronic right hip pain 08/06/2017  . Chronic right-sided low back pain without sciatica 08/06/2017  . GERD (gastroesophageal reflux disease)   . Hypertension   . Hypertriglyceridemia 08/06/2017  . Seasonal allergies    Past Surgical History:  Procedure Laterality Date  . ESOPHAGOGASTRODUODENOSCOPY    . NO PAST SURGERIES     Social History   Tobacco Use  . Smoking status: Never Smoker  . Smokeless tobacco: Never Used  Substance Use Topics  . Alcohol use: No    Frequency: Never   family history includes Asthma in his sister; COPD in his father; Dementia in his mother; Heart attack in his paternal uncle; Heart failure in his father; Stroke in his father and paternal uncle.    ROS: negative except as noted in the  HPI  Medications: Current Outpatient Medications  Medication Sig Dispense Refill  . aspirin EC 81 MG tablet Take 1 tablet (81 mg total) by mouth daily. 90 tablet 3  . celecoxib (CELEBREX) 200 MG capsule Take 1 capsule (200 mg total) by mouth 2 (two) times daily. 60 capsule 3  . cimetidine (TAGAMET) 200 MG tablet Take 200 mg by mouth 2 (two) times daily as needed.    . famotidine (PEPCID) 20 MG tablet Take 20 mg by mouth 2 (two) times daily as needed.    . fluticasone (FLONASE) 50 MCG/ACT nasal spray Place 1 spray into both nostrils at bedtime. 16 g 3  . hydrochlorothiazide (HYDRODIURIL) 25 MG tablet Take 1 tablet (25 mg total) by mouth daily. 30 tablet 5  . loratadine (CLARITIN) 10 MG tablet Take 10 mg by mouth daily.     No current facility-administered medications for this visit.    No Known Allergies     Objective:  BP 127/80   Pulse 61   Temp 98.7 F (37.1 C) (Oral)   Wt 236 lb (107 kg)   BMI 38.09 kg/m   Wt Readings from Last 3 Encounters:  01/04/19 236 lb (107 kg)  09/06/18 227 lb (103 kg)  07/21/18 239 lb (108.4  kg)   Temp Readings from Last 3 Encounters:  01/04/19 98.7 F (37.1 C) (Oral)   BP Readings from Last 3 Encounters:  01/04/19 127/80  09/06/18 123/80  07/21/18 (!) 150/88   Pulse Readings from Last 3 Encounters:  01/04/19 61  09/06/18 (!) 58  07/21/18 78    Gen:  alert, not ill-appearing, no distress, appropriate for age HEENT: head normocephalic without obvious abnormality, conjunctiva and cornea clear, trachea midline Pulm: Normal work of breathing, normal phonation, clear to auscultation bilaterally, no wheezes, rales or rhonchi CV: Normal rate, regular rhythm, s1 and s2 distinct, no murmurs, clicks or rubs  Neuro: alert and oriented x 3, no tremor MSK: extremities atraumatic, normal gait and station, trace peripheral edema, calves non-tender, no palpable cords, distal pulses intact Left wrist: tenderness over the medial compartment, positive  Finkelsteins, strength intact Skin: intact, no rashes on exposed skin, no jaundice, no cyanosis Psych: well-groomed, cooperative, good eye contact, euthymic mood, affect mood-congruent, speech is articulate, and thought processes clear and goal-directed  Lab Results  Component Value Date   CREATININE 1.25 01/04/2019   BUN 16 01/04/2019   NA 138 01/04/2019   K 4.1 01/04/2019   CL 104 01/04/2019   CO2 27 01/04/2019   Lab Results  Component Value Date   ALT 24 01/04/2019   AST 18 01/04/2019   BILITOT 0.5 01/04/2019   Lab Results  Component Value Date   CHOL 179 01/04/2019   HDL 41 01/04/2019   LDLCALC 103 (H) 01/04/2019   TRIG 232 (H) 01/04/2019   CHOLHDL 4.4 01/04/2019   The 10-year ASCVD risk score Denman George(Goff DC Jr., et al., 2013) is: 6.8%   Values used to calculate the score:     Age: [age] years     Sex: Male     Is Non-Hispanic African American: No     Diabetic: No     Tobacco smoker: No     Systolic Blood Pressure: 127 mmHg     Is BP treated: Yes     HDL Cholesterol: 41 mg/dL     Total Cholesterol: 179 mg/dL   No results found for this or any previous visit (from the past 72 hour(s)). No results found.    Assessment and Plan: 58 y.o. male with   .Adrian [name] was seen today for hypertension.  Diagnoses and all orders for this visit:  Hypertension goal BP (blood pressure) < 130/80 -     TSH + free T4 -     Ferritin -     CBC -     COMPLETE METABOLIC PANEL WITH GFR -     hydrochlorothiazide (HYDRODIURIL) 25 MG tablet; Take 1 tablet (25 mg total) by mouth daily. -     Lipid Panel w/reflex Direct LDL  Restless legs -     TSH + free T4 -     Ferritin -     CBC -     COMPLETE METABOLIC PANEL WITH GFR  Mild peripheral edema -     TSH + free T4 -     Ferritin -     CBC -     COMPLETE METABOLIC PANEL WITH GFR -     hydrochlorothiazide (HYDRODIURIL) 25 MG tablet; Take 1 tablet (25 mg total) by mouth daily.  Acute pain of left wrist -     DG Wrist Complete  Left  De Quervain's tenosynovitis, left -     DG Wrist Complete Left  Dyslipidemia, goal LDL below 100 -  Lipid Panel w/reflex Direct LDL    HTN BP Readings from Last 3 Encounters:  01/04/19 127/80  09/06/18 123/80  07/21/18 (!) 150/88  D/c Amlodipine due to edema. Start HCTZ 25 mg 10 yr ASCVD risk 6.8% Cont baby asa for primary prevention Discussed statin - patient would like to await lipid resuts Counseled on therapeutic lifestyle changes Home sleep study was ordered 06/2018, but never performed. Re-printed requisition and gave to referral coordinator today  Left wrist pain Suspect De Quervain's Thumb spicca splint daily for 4 weeks Home rehab exercises  Patient education and anticipatory guidance given Patient agrees with treatment plan Follow-up in 1 month or sooner as needed if symptoms worsen or fail to improve  Levonne Hubertharley E. Mahealani Sulak PA-C

## 2019-01-04 NOTE — Patient Instructions (Addendum)
For your blood pressure: - Goal <130/80 (Ideally 120's/70's) - take your blood pressure medication in the morning (unless instructed differently) - take baby aspirin 81 mg daily to help prevent heart attack/stroke - monitor and log blood pressures at home - check around the same time each day in a relaxed setting - Limit salt to <2500 mg/day - Follow DASH (Dietary Approach to Stopping Hypertension) eating plan - Try to get at least 150 minutes of aerobic exercise per week - Aim to go on a brisk walk 30 minutes per day at least 5 days per week. If you're not active, gradually increase how long you walk by 5 minutes each week - limit alcohol: 2 standard drinks per day for men and 1 per day for women - avoid tobacco/nicotine products. Consider smoking cessation if you smoke - weight loss: 7% of current body weight can reduce your blood pressure by 5-10 points - follow-up at least every 6 months for your blood pressure. Follow-up sooner if your BP is not controlled    

## 2019-01-05 LAB — LIPID PANEL W/REFLEX DIRECT LDL
Cholesterol: 179 mg/dL (ref <200)
HDL: 41 mg/dL (ref >=40)
LDL Cholesterol (Calc): 103 mg/dL (calc) — ABNORMAL HIGH
Non-HDL Cholesterol (Calc): 138 mg/dL (calc) — ABNORMAL HIGH (ref <130)
Total CHOL/HDL Ratio: 4.4 (calc) (ref <5.0)
Triglycerides: 232 mg/dL — ABNORMAL HIGH (ref <150)

## 2019-01-05 LAB — CBC
HCT: 44.7 % (ref 38.5–50.0)
Hemoglobin: 15 g/dL (ref 13.2–17.1)
MCH: 29.9 pg (ref 27.0–33.0)
MCHC: 33.6 g/dL (ref 32.0–36.0)
MCV: 89 fL (ref 80.0–100.0)
MPV: 11.1 fL (ref 7.5–12.5)
Platelets: 214 10*3/uL (ref 140–400)
RBC: 5.02 10*6/uL (ref 4.20–5.80)
RDW: 12.5 % (ref 11.0–15.0)
WBC: 5.1 10*3/uL (ref 3.8–10.8)

## 2019-01-05 LAB — COMPLETE METABOLIC PANEL WITH GFR
AG Ratio: 2 (calc) (ref 1.0–2.5)
ALT: 24 U/L (ref 9–46)
AST: 18 U/L (ref 10–35)
Albumin: 4.7 g/dL (ref 3.6–5.1)
Alkaline phosphatase (APISO): 52 U/L (ref 35–144)
BUN: 16 mg/dL (ref 7–25)
CO2: 27 mmol/L (ref 20–32)
Calcium: 9.8 mg/dL (ref 8.6–10.3)
Chloride: 104 mmol/L (ref 98–110)
Creat: 1.25 mg/dL (ref 0.70–1.33)
GFR, Est African American: 75 mL/min/{1.73_m2} (ref >=60)
GFR, Est Non African American: 64 mL/min/{1.73_m2} (ref >=60)
Globulin: 2.3 g/dL (calc) (ref 1.9–3.7)
Glucose, Bld: 85 mg/dL (ref 65–99)
Potassium: 4.1 mmol/L (ref 3.5–5.3)
Sodium: 138 mmol/L (ref 135–146)
Total Bilirubin: 0.5 mg/dL (ref 0.2–1.2)
Total Protein: 7 g/dL (ref 6.1–8.1)

## 2019-01-05 LAB — TSH+FREE T4: TSH W/REFLEX TO FT4: 1.12 mIU/L (ref 0.40–4.50)

## 2019-01-05 LAB — FERRITIN: Ferritin: 129 ng/mL (ref 38–380)

## 2019-01-12 ENCOUNTER — Encounter: Payer: Self-pay | Admitting: Physician Assistant

## 2019-02-01 ENCOUNTER — Ambulatory Visit: Payer: BC Managed Care – PPO | Admitting: Physician Assistant

## 2019-03-17 ENCOUNTER — Other Ambulatory Visit: Payer: Self-pay | Admitting: Sports Medicine

## 2019-03-17 DIAGNOSIS — M1611 Unilateral primary osteoarthritis, right hip: Secondary | ICD-10-CM

## 2019-03-25 ENCOUNTER — Other Ambulatory Visit: Payer: Self-pay

## 2019-03-25 ENCOUNTER — Encounter: Payer: Self-pay | Admitting: Osteopathic Medicine

## 2019-03-25 ENCOUNTER — Ambulatory Visit (INDEPENDENT_AMBULATORY_CARE_PROVIDER_SITE_OTHER): Payer: BC Managed Care – PPO | Admitting: Osteopathic Medicine

## 2019-03-25 VITALS — BP 110/62 | HR 61 | Temp 98.4°F | Wt 236.1 lb

## 2019-03-25 DIAGNOSIS — R8290 Unspecified abnormal findings in urine: Secondary | ICD-10-CM | POA: Diagnosis not present

## 2019-03-25 DIAGNOSIS — I10 Essential (primary) hypertension: Secondary | ICD-10-CM

## 2019-03-25 DIAGNOSIS — R600 Localized edema: Secondary | ICD-10-CM | POA: Diagnosis not present

## 2019-03-25 MED ORDER — HYDROCHLOROTHIAZIDE 12.5 MG PO TABS: 12.5000 mg | ORAL_TABLET | Freq: Every day | ORAL | 3 refills | Status: DC

## 2019-03-25 NOTE — Progress Notes (Signed)
HPI: Adrian [name] is a 58 y.o. male who  has a past medical history of Chronic right hip pain (08/06/2017), Chronic right-sided low back pain without sciatica (08/06/2017), GERD (gastroesophageal reflux disease), Hypertension, Hypertriglyceridemia (08/06/2017), and Seasonal allergies.  he presents to [facility], 03/25/19,  for chief complaint of:  Follow-up on medications   HTN: Reduced dose HCT to 12.5 and this seems to be doing ok on this dose.  Had some issues with darker stool, constipation, headache, dyspnea on exertion, bloating and indigestion, paresthesia, increased thirst, increased fatigue on amlodipine all of this has resolved since being on the hydrochlorothiazide there are issues with the exception of having to go to the bathroom more often in the beginning (urination) and concerned about cloudy urine. Pressure readings at home he reports have been under 120/80. BP Readings from Last 3 Encounters:  03/25/19 110/62  01/04/19 127/80  09/06/18 123/80      At today's visit 03/25/19 ... PMH, PSH, FH reviewed and updated as needed.  Current medication list and allergy/intolerance hx reviewed and updated as needed. (See remainder of HPI, ROS, Phys Exam below)   No results found.  No results found for this or any previous visit (from the past 72 hour(s)).        ASSESSMENT/PLAN: The primary encounter diagnosis was Hypertension goal BP (blood pressure) < 130/80. Diagnoses of Mild peripheral edema and Cloudy urine were also pertinent to this visit.   Orders Placed This Encounter  Procedures  . BASIC METABOLIC PANEL WITH GFR  . Urinalysis, Routine w reflex microscopic     Meds ordered this encounter  Medications  . hydrochlorothiazide (HYDRODIURIL) 12.5 MG tablet    Sig: Take 1 tablet (12.5 mg total) by mouth daily.    Dispense:  90 tablet    Refill:  3        Follow-up plan: Return in about 6 months (around  09/23/2019) for ANNUAL (call week prior to visit for lab orders).                                                 ################################################# ################################################# ################################################# #################################################    Current Meds  Medication Sig  . aspirin EC 81 MG tablet Take 1 tablet (81 mg total) by mouth daily.  . celecoxib (CELEBREX) 200 MG capsule TAKE 1 CAPSULE(200 MG) BY MOUTH TWICE DAILY  . cimetidine (TAGAMET) 200 MG tablet Take 200 mg by mouth 2 (two) times daily as needed.  . famotidine (PEPCID) 20 MG tablet Take 20 mg by mouth 2 (two) times daily as needed.  . fluticasone (FLONASE) 50 MCG/ACT nasal spray Place 1 spray into both nostrils at bedtime.  . hydrochlorothiazide (HYDRODIURIL) 12.5 MG tablet Take 1 tablet (12.5 mg total) by mouth daily.  Marland Kitchen loratadine (CLARITIN) 10 MG tablet Take 10 mg by mouth daily.  . [DISCONTINUED] hydrochlorothiazide (HYDRODIURIL) 25 MG tablet Take 1 tablet (25 mg total) by mouth daily.    No Known Allergies     Review of Systems:  Constitutional: No recent illness  HEENT: No  headache, no vision change  Cardiac: No  chest pain, No  pressure, No palpitations  Respiratory:  No  shortness of breath. No  Cough  Gastrointestinal: No  abdominal pain  Musculoskeletal: No new myalgia/arthralgia  Neurologic: No  weakness, No  Dizziness  Exam:  BP 110/62 (BP Location: Left Arm, Patient Position: Sitting, Cuff Size: Large)   Pulse 61   Temp 98.4 F (36.9 C) (Oral)   Wt 236 lb 1.9 oz (107.1 kg)   BMI 38.11 kg/m   Constitutional: VS see above. General Appearance: alert, well-developed, well-nourished, NAD  Eyes: Normal lids and conjunctive, non-icteric sclera  Neck: No masses, trachea midline.   Respiratory: Normal respiratory effort. no wheeze, no rhonchi, no rales  Cardiovascular:  S1/S2 normal, no murmur, no rub/gallop auscultated. RRR.   Musculoskeletal: Gait normal. Symmetric and independent movement of all extremities  Neurological: Normal balance/coordination. No tremor.  Skin: warm, dry, intact.   Psychiatric: Normal judgment/insight. Normal mood and affect. Oriented x3.       Visit summary with medication list and pertinent instructions was printed for patient to review, patient was advised to alert [place] if any updates are needed. All questions at time of visit were answered - patient instructed to contact office with any additional concerns. ER/RTC precautions were reviewed with the patient and understanding verbalized.    Please note: voice recognition software was used to produce this document, and typos may escape review. Please contact Dr. Lyn Hollingshead for any needed clarifications.    Follow up plan: Return in about 6 months (around 09/23/2019) for [place] (call week prior to visit for lab orders).

## 2019-03-26 LAB — URINALYSIS, ROUTINE W REFLEX MICROSCOPIC
Bilirubin Urine: NEGATIVE
Glucose, UA: NEGATIVE
Hgb urine dipstick: NEGATIVE
Ketones, ur: NEGATIVE
Leukocytes,Ua: NEGATIVE
Nitrite: NEGATIVE
Protein, ur: NEGATIVE
Specific Gravity, Urine: 1.02 (ref 1.001–1.03)
pH: 6 (ref 5.0–8.0)

## 2019-03-26 LAB — BASIC METABOLIC PANEL WITH GFR
BUN: 18 mg/dL (ref 7–25)
CO2: 26 mmol/L (ref 20–32)
Calcium: 9.4 mg/dL (ref 8.6–10.3)
Chloride: 104 mmol/L (ref 98–110)
Creat: 0.99 mg/dL (ref 0.70–1.33)
GFR, Est African American: 98 mL/min/{1.73_m2} (ref >=60)
GFR, Est Non African American: 85 mL/min/{1.73_m2} (ref >=60)
Glucose, Bld: 97 mg/dL (ref 65–99)
Potassium: 4.2 mmol/L (ref 3.5–5.3)
Sodium: 138 mmol/L (ref 135–146)

## 2019-07-27 DIAGNOSIS — U071 COVID-19: Secondary | ICD-10-CM | POA: Diagnosis not present

## 2019-08-08 ENCOUNTER — Emergency Department
Admission: EM | Admit: 2019-08-08 | Discharge: 2019-08-08 | Disposition: A | Discharge status: Home / Self Care | Payer: BC Managed Care – PPO | Source: Home / Self Care

## 2019-08-08 ENCOUNTER — Other Ambulatory Visit: Payer: Self-pay

## 2019-08-08 ENCOUNTER — Emergency Department (INDEPENDENT_AMBULATORY_CARE_PROVIDER_SITE_OTHER): Payer: BC Managed Care – PPO

## 2019-08-08 DIAGNOSIS — Z8616 Personal history of COVID-19: Secondary | ICD-10-CM

## 2019-08-08 DIAGNOSIS — R05 Cough: Secondary | ICD-10-CM

## 2019-08-08 DIAGNOSIS — R059 Cough, unspecified: Secondary | ICD-10-CM

## 2019-08-08 MED ORDER — DEXAMETHASONE SODIUM PHOSPHATE 10 MG/ML IJ SOLN
10.0000 mg | Freq: Once | INTRAMUSCULAR | Status: AC
  Administered 2019-08-08: 10 mg via INTRAMUSCULAR

## 2019-08-08 MED ORDER — BENZONATATE 100 MG PO CAPS: 100.0000 mg | ORAL_CAPSULE | Freq: Three times a day (TID) | ORAL | 0 refills | Status: DC

## 2019-08-08 NOTE — ED Triage Notes (Signed)
Patient presents to Urgent Care with complaints of persistent cough since several weeks ago. Patient reports the cough is productive, was diagnosed w/ covid on 07/27/19.

## 2019-08-08 NOTE — ED Provider Notes (Signed)
Adrian [name] CARE    CSN: [id number] Arrival date & time: 08/08/19  1520      History   Chief Complaint Chief Complaint  Patient presents with  . Cough    HPI Adrian [name] is a 59 y.o. male.   HPI Adrian [name] is a 59 y.o. male presenting to UC with c/o persistent dry hacking cough since 07/27/19 when he was dx with Covid after his wife had tested positive for Covid.  Cough is worse at night, keeps him up.  Mild SOB when he coughs consecutively.  Denies chest pain. No fever, chills, n/v/d. He tried mucinex w/o relief. No hx of asthma. He has never needed an inhaler when sick. Hx of mild pneumonia years ago, never needed to go to the hospital.    Past Medical History:  Diagnosis Date  . Chronic right hip pain 08/06/2017  . Chronic right-sided low back pain without sciatica 08/06/2017  . GERD (gastroesophageal reflux disease)   . Hypertension   . Hypertriglyceridemia 08/06/2017  . Seasonal allergies     Patient Active Problem List   Diagnosis Date Noted  . Primary osteoarthritis involving multiple joints 07/26/2018  . Acute left ankle pain 07/26/2018  . Cerumen debris on tympanic membrane of right ear 07/26/2018  . Tinea pedis 07/21/2018  . Periscapular pain 07/20/2018  . Retrocalcaneal bursitis, left 07/20/2018  . Hepatitis B core antibody positive 07/20/2018  . Polyarthralgia 07/20/2018  . At risk for obstructive sleep apnea 07/20/2018  . Hearing loss due to cerumen impaction, right 11/02/2017  . Seasonal allergic rhinitis due to pollen 11/02/2017  . Fasting hyperglycemia 08/06/2017  . Primary osteoarthritis of right hip 08/06/2017  . Chronic right-sided low back pain without sciatica 08/06/2017  . Hypertriglyceridemia 08/06/2017  . 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  . Gastroesophageal reflux disease without esophagitis 07/09/2017  . Hypertension goal BP (blood pressure) < 130/80 07/09/2017  . Class 2 obesity due to excess calories in adult 07/09/2017     Past Surgical History:  Procedure Laterality Date  . ESOPHAGOGASTRODUODENOSCOPY    . NO PAST SURGERIES         Home Medications    Prior to Admission medications   Medication Sig Start Date End Date Taking? Authorizing Provider  aspirin EC 81 MG tablet Take 1 tablet (81 mg total) by mouth daily. 07/09/17   Carlis Stable, PA-C  benzonatate (TESSALON) 100 MG capsule Take 1-2 capsules (100-200 mg total) by mouth every 8 (eight) hours. 08/08/19   Lurene Shadow, PA-C  celecoxib (CELEBREX) 200 MG capsule TAKE 1 CAPSULE(200 MG) BY MOUTH TWICE DAILY 03/17/19   Monica Becton, MD  cimetidine (TAGAMET) 200 MG tablet Take 200 mg by mouth 2 (two) times daily as needed.    [provider]  famotidine (PEPCID) 20 MG tablet Take 20 mg by mouth 2 (two) times daily as needed.    [provider]  fluticasone (FLONASE) 50 MCG/ACT nasal spray Place 1 spray into both nostrils at bedtime. 11/02/17   Carlis Stable, PA-C  hydrochlorothiazide (HYDRODIURIL) 12.5 MG tablet Take 1 tablet (12.5 mg total) by mouth daily. 03/25/19   Sunnie Nielsen, DO  loratadine (CLARITIN) 10 MG tablet Take 10 mg by mouth daily.    [provider]    Family History Family History  Problem Relation Age of Onset  . Dementia Mother   . Stroke Father   . COPD Father   . Heart failure Father   . Asthma  Father   . Asthma Sister   . Heart attack Paternal Uncle   . Stroke Paternal Uncle     Social History Social History   Tobacco Use  . Smoking status: Never Smoker  . Smokeless tobacco: Never Used  Substance Use Topics  . Alcohol use: No  . Drug use: No     Allergies   Patient has no known allergies.   Review of Systems Review of Systems  Constitutional: Positive for fatigue. Negative for chills and fever.  HENT: Positive for congestion. Negative for ear pain, sore throat, trouble swallowing and voice change.   Respiratory: Positive for cough,  chest tightness and shortness of breath.   Cardiovascular: Negative for chest pain and palpitations.  Gastrointestinal: Negative for abdominal pain, diarrhea, nausea and vomiting.  Musculoskeletal: Negative for arthralgias, back pain and myalgias.  Skin: Negative for rash.  Neurological: Positive for headaches. Negative for dizziness and light-headedness.  All other systems reviewed and are negative.    Physical Exam Triage Vital Signs ED Triage Vitals  Enc Vitals Group     BP 08/08/19 1537 (!) 144/89     Pulse Rate 08/08/19 1537 69     Resp 08/08/19 1537 16     Temp 08/08/19 1537 98.8 F (37.1 C)     Temp Source 08/08/19 1537 Oral     SpO2 08/08/19 1537 97 %     Weight --      Height --      Head Circumference --      Peak Flow --      Pain Score 08/08/19 1535 0     Pain Loc --      Pain Edu? --      Excl. in [place]? --    No data found.  Updated Vital Signs BP (!) 144/89 (BP Location: Right Arm)   Pulse 69   Temp 98.8 F (37.1 C) (Oral)   Resp 16   SpO2 97%   Visual Acuity Right Eye Distance:   Left Eye Distance:   Bilateral Distance:    Right Eye Near:   Left Eye Near:    Bilateral Near:     Physical Exam Vitals and nursing note reviewed.  Constitutional:      General: He is not in acute distress.    Appearance: Normal appearance. He is well-developed. He is not ill-appearing, toxic-appearing or diaphoretic.  HENT:     Head: Normocephalic and atraumatic.     Right Ear: Tympanic membrane and ear canal normal.     Left Ear: Tympanic membrane and ear canal normal.     Nose: Nose normal.     Right Sinus: No maxillary sinus tenderness or frontal sinus tenderness.     Left Sinus: No maxillary sinus tenderness or frontal sinus tenderness.     Mouth/Throat:     Lips: Pink.     Mouth: Mucous membranes are moist.     Pharynx: Oropharynx is clear. Uvula midline.  Cardiovascular:     Rate and Rhythm: Normal rate and regular rhythm.  Pulmonary:     Effort:  Pulmonary effort is normal. No respiratory distress.     Breath sounds: No stridor. Wheezing (mild, Right lower lung field, expiratory) present. No rhonchi or rales.  Musculoskeletal:        General: Normal range of motion.     Cervical back: Normal range of motion.  Skin:    General: Skin is warm and dry.  Neurological:     Mental  Status: He is alert and oriented to person, place, and time.  Psychiatric:        Behavior: Behavior normal.      UC Treatments / Results  Labs (all labs ordered are listed, but only abnormal results are displayed) Labs Reviewed - No data to display  EKG   Radiology DG Chest 2 View  Result Date: 08/08/2019 CLINICAL DATA:  Persistent cough for several weeks. EXAM: CHEST - 2 VIEW COMPARISON:  None. FINDINGS: The heart size and mediastinal contours are within normal limits. Both lungs are clear. The visualized skeletal structures are unremarkable. IMPRESSION: No active cardiopulmonary disease. Electronically Signed   By: Aram Candela M.D.   On: 08/08/2019 16:15    Procedures Procedures (including critical care time)  Medications Ordered in UC Medications  dexamethasone (DECADRON) injection 10 mg (10 mg Intramuscular Given 08/08/19 1629)    Initial Impression / Assessment and Plan / UC Course  I have reviewed the triage vital signs and the nursing notes.  Pertinent labs & imaging results that were available during my care of the patient were reviewed by me and considered in my medical decision making (see chart for details).     Reviewed imaging with pt Reassured no evidence of pneumonia  O2 Sat 97% on RA Doubt PE or CAD Recommend conservative tx F/u with PCP  Discussed symptoms that warrant emergent care in the ED. AVS provided  Final Clinical Impressions(s) / UC Diagnoses   Final diagnoses:  Cough  History of COVID-19     Discharge Instructions      You may take 500mg  acetaminophen every 4-6 hours or in combination with  ibuprofen 400-600mg  every 6-8 hours as needed for pain, inflammation, and fever.  Be sure to well hydrated with clear liquids and get at least 8 hours of sleep at night, preferably more while sick.   Please follow up with family medicine in 1 week if needed, she will likely offer a virtual visit to help determine if you need to be seen at a Covid specific clinic in [place], be sent to the hospital or home treatment can continue.  Call 911 or go to the closest hospital if you develop chest pain, difficulty breathing, passing out, or other new concerning symptoms develop.      ED Prescriptions    Medication Sig Dispense Auth. Provider   benzonatate (TESSALON) 100 MG capsule Take 1-2 capsules (100-200 mg total) by mouth every 8 (eight) hours. 21 capsule [place], Lurene Shadow     PDMP not reviewed this encounter.   [place], PA-C 08/08/19 2000

## 2019-08-08 NOTE — Discharge Instructions (Signed)
  You may take 500mg  acetaminophen every 4-6 hours or in combination with ibuprofen 400-600mg  every 6-8 hours as needed for pain, inflammation, and fever.  Be sure to well hydrated with clear liquids and get at least 8 hours of sleep at night, preferably more while sick.   Please follow up with family medicine in 1 week if needed, she will likely offer a virtual visit to help determine if you need to be seen at a Covid specific clinic in [place], be sent to the hospital or home treatment can continue.  Call 911 or go to the closest hospital if you develop chest pain, difficulty breathing, passing out, or other new concerning symptoms develop.

## 2019-10-03 ENCOUNTER — Ambulatory Visit (INDEPENDENT_AMBULATORY_CARE_PROVIDER_SITE_OTHER): Payer: BC Managed Care – PPO | Admitting: Osteopathic Medicine

## 2019-10-03 ENCOUNTER — Encounter: Payer: Self-pay | Admitting: Osteopathic Medicine

## 2019-10-03 ENCOUNTER — Other Ambulatory Visit: Payer: Self-pay

## 2019-10-03 VITALS — BP 131/86 | HR 61 | Temp 98.2°F | Wt 235.1 lb

## 2019-10-03 DIAGNOSIS — K219 Gastro-esophageal reflux disease without esophagitis: Secondary | ICD-10-CM

## 2019-10-03 DIAGNOSIS — I10 Essential (primary) hypertension: Secondary | ICD-10-CM

## 2019-10-03 DIAGNOSIS — E785 Hyperlipidemia, unspecified: Secondary | ICD-10-CM | POA: Diagnosis not present

## 2019-10-03 DIAGNOSIS — Z0000 Encounter for general adult medical examination without abnormal findings: Secondary | ICD-10-CM

## 2019-10-03 DIAGNOSIS — J301 Allergic rhinitis due to pollen: Secondary | ICD-10-CM | POA: Diagnosis not present

## 2019-10-03 DIAGNOSIS — Z125 Encounter for screening for malignant neoplasm of prostate: Secondary | ICD-10-CM

## 2019-10-03 DIAGNOSIS — R7301 Impaired fasting glucose: Secondary | ICD-10-CM | POA: Diagnosis not present

## 2019-10-03 DIAGNOSIS — M1611 Unilateral primary osteoarthritis, right hip: Secondary | ICD-10-CM

## 2019-10-03 DIAGNOSIS — R600 Localized edema: Secondary | ICD-10-CM

## 2019-10-03 DIAGNOSIS — G5603 Carpal tunnel syndrome, bilateral upper limbs: Secondary | ICD-10-CM

## 2019-10-03 MED ORDER — FAMOTIDINE 20 MG PO TABS: 20.0000 mg | ORAL_TABLET | Freq: Two times a day (BID) | ORAL | 1 refills | Status: DC | PRN

## 2019-10-03 MED ORDER — HYDROCHLOROTHIAZIDE 12.5 MG PO TABS: 12.5000 mg | ORAL_TABLET | Freq: Every day | ORAL | 3 refills | Status: DC

## 2019-10-03 MED ORDER — CELECOXIB 200 MG PO CAPS: 200.0000 mg | ORAL_CAPSULE | Freq: Every day | ORAL | 3 refills | Status: DC

## 2019-10-03 MED ORDER — LORATADINE 10 MG PO TABS: 10.0000 mg | ORAL_TABLET | Freq: Every day | ORAL | 3 refills | Status: DC

## 2019-10-03 MED ORDER — FLUTICASONE PROPIONATE 50 MCG/ACT NA SUSP: 1.0000 | Freq: Every day | NASAL | 3 refills | Status: DC

## 2019-10-03 NOTE — Progress Notes (Signed)
Adrian [name] is a 59 y.o. male who presents to  [facility] at [facility]  today, 10/03/19, seeking care for the following: . Annual check-up . NEW PROBLEM: Numbness/tingling in hands worse in evenings/night-time, worse w/ gripping  . Follow-up STABLE chronic conditions:   HTN: HCTZ   Allergies: nasal steroid fluticasone and 2nd Gen AH loratadine   GERD: H2B Famotidine better, not really taking the Cimetidine    Labs:   02/2019 BMP ok   12/2018 Lipids ok, TG elevated ?fasting TSH WNL    ASSESSMENT & PLAN with other pertinent history/findings:  The primary encounter diagnosis was Annual physical exam. Diagnoses of Hypertension goal BP (blood pressure) < 130/80, Dyslipidemia, goal LDL below 100, Seasonal allergic rhinitis due to pollen, Gastroesophageal reflux disease, unspecified whether esophagitis present, and Prostate cancer screening were also pertinent to this visit.   BP Readings from Last 3 Encounters:  10/03/19 131/86  08/08/19 (!) 144/89  03/25/19 110/62       Patient Instructions  General Preventive Care  Most recent routine screening labs: ordered today.   BP goal 130/80 or less.   Tobacco: don't!   Alcohol: responsible moderation is ok for most adults - if you have concerns about your alcohol intake, please talk to me!   Exercise: as tolerated to reduce risk of cardiovascular disease and diabetes. Strength training will also prevent osteoporosis.   Mental health: if need for mental health care (medicines, counseling, other), or concerns about moods, please let me know!   Sexual health: if need for STD testing, or if concerns with libido/pain problems, please let me know!   Advanced Directive: Living Will and/or Healthcare Power of Attorney recommended for all adults, regardless of age or health.  Vaccines  Flu vaccine: for almost everyone, every fall.   Shingles vaccine: after age 86.   Pneumonia  vaccines: after age 67  Tetanus booster: every 10 years - due 2024  COVID vaccine: RECOMMENDED!  Cancer screenings   Colon cancer screening: for everyone age 76-75. Colonoscopy DUE 02/2023  Prostate cancer screening: PSA blood test age 34-71  Lung cancer screening: not needed for non-smokers  Infection screenings  . HIV: recommended screening at least once age 12-65 done - repeat as needed  . Gonorrhea/Chlamydia: screening as needed . Hepatitis C: recommended once for everyone age 46-75 done - repeat as needed  . TB: certain at-risk populations, or depending on work requirements and/or travel history Other . Bone Density Test: recommended for men at age 15 . Abdominal Aortic Aneurysm: screening not needed for non-smokers     Orders Placed This Encounter  Procedures  . CBC  . COMPLETE METABOLIC PANEL WITH GFR  . Lipid panel  . PSA, Total with Reflex to PSA, Free    No orders of the defined types were placed in this encounter.  Constitutional:  . VSS, see nurse notes . General Appearance: alert, well-developed, well-nourished, NAD Eyes: Marland Kitchen Normal lids and conjunctive, non-icteric sclera . PERRLA Ears, Nose, Mouth, Throat: . Normal appearance . MMM, posterior pharynx without erythema/exudate Neck: . No masses, trachea midline . No thyroid enlargement/tenderness/mass appreciated Respiratory: . Normal respiratory effort . Breath sounds normal, no wheeze/rhonchi/rales Cardiovascular: . S1/S2 normal, no murmur/rub/gallop auscultated . Pedal pulse II/IV bilaterally DP and PT . No lower extremity edema Gastrointestinal: . Nontender, no masses . No hepatomegaly, no splenomegaly . No hernia appreciated Musculoskeletal:  . Gait normal . No clubbing/cyanosis of digits . (+) tinel's and  phalen's  Neurological: . No cranial nerve deficit on limited exam . Motor and sensation intact and symmetric Psychiatric: . Normal judgment/insight . Normal mood and  affect     Follow-up instructions: Return for NURSE VISIT BP CHECK IN 6 MOS. ANNUAL W/ LABS IN 1 YEAR (AROUND 09/2020). Follow w/ sports med for wrist/hand/knee issues as needed                                          BP 131/86 (BP Location: Left Arm, Patient Position: Sitting, Cuff Size: Large)   Pulse 61   Temp 98.2 F (36.8 C) (Oral)   Wt 235 lb 1.3 oz (106.6 kg)   BMI 37.94 kg/m   Current Meds  Medication Sig  . aspirin EC 81 MG tablet Take 1 tablet (81 mg total) by mouth daily.  . celecoxib (CELEBREX) 200 MG capsule TAKE 1 CAPSULE(200 MG) BY MOUTH TWICE DAILY  . cimetidine (TAGAMET) 200 MG tablet Take 200 mg by mouth 2 (two) times daily as needed.  . diphenhydrAMINE-APAP, sleep, (TYLENOL PM EXTRA STRENGTH PO) Take by mouth.  . famotidine (PEPCID) 20 MG tablet Take 20 mg by mouth 2 (two) times daily as needed.  . fluticasone (FLONASE) 50 MCG/ACT nasal spray Place 1 spray into both nostrils at bedtime.  . hydrochlorothiazide (HYDRODIURIL) 12.5 MG tablet Take 1 tablet (12.5 mg total) by mouth daily.  Marland Kitchen loratadine (CLARITIN) 10 MG tablet Take 10 mg by mouth daily.  . Melatonin 5 MG CAPS Take by mouth.    No results found for this or any previous visit (from the past 72 hour(s)).  No results found.  Depression screen [facility] 2/9 10/03/2019 03/25/2019 07/20/2018  Decreased Interest 0 0 0  Down, Depressed, Hopeless 0 0 0  PHQ - 2 Score 0 0 0  Altered sleeping 1 1 -  Tired, decreased energy 0 1 -  Change in appetite 0 0 -  Feeling bad or failure about yourself  0 0 -  Trouble concentrating 0 0 -  Moving slowly or fidgety/restless 0 0 -  Suicidal thoughts 0 0 -  PHQ-9 Score 1 2 -  Difficult doing work/chores Not difficult at all Not difficult at all -    GAD 7 : Generalized Anxiety Score 10/03/2019 03/25/2019  Nervous, Anxious, on Edge 0 0  Control/stop worrying 0 0  Worry too much - different things 0 0  Trouble relaxing 0 0  Restless  0 0  Easily annoyed or irritable 0 0  Afraid - awful might happen 0 0  Total GAD 7 Score 0 0      All questions at time of visit were answered - patient instructed to contact office with any additional concerns or updates.  ER/RTC precautions were reviewed with the patient.  Please note: voice recognition software was used to produce this document, and typos may escape review. Please contact Dr. Sheppard Coil for any needed clarifications.

## 2019-10-03 NOTE — Patient Instructions (Signed)
General Preventive Care  Most recent routine screening labs: ordered today.   BP goal 130/80 or less.   Tobacco: don't!   Alcohol: responsible moderation is ok for most adults - if you have concerns about your alcohol intake, please talk to me!   Exercise: as tolerated to reduce risk of cardiovascular disease and diabetes. Strength training will also prevent osteoporosis.   Mental health: if need for mental health care (medicines, counseling, other), or concerns about moods, please let me know!   Sexual health: if need for STD testing, or if concerns with libido/pain problems, please let me know!   Advanced Directive: Living Will and/or Healthcare Power of Attorney recommended for all adults, regardless of age or health.  Vaccines  Flu vaccine: for almost everyone, every fall.   Shingles vaccine: after age 36.   Pneumonia vaccines: after age 62  Tetanus booster: every 10 years - due 2024  COVID vaccine: RECOMMENDED!  Cancer screenings   Colon cancer screening: for everyone age 49-75. Colonoscopy DUE 02/2023  Prostate cancer screening: PSA blood test age 17-71  Lung cancer screening: not needed for non-smokers  Infection screenings  . HIV: recommended screening at least once age 25-65 done - repeat as needed  . Gonorrhea/Chlamydia: screening as needed . Hepatitis C: recommended once for everyone age 37-75 done - repeat as needed  . TB: certain at-risk populations, or depending on work requirements and/or travel history Other . Bone Density Test: recommended for men at age 19 . Abdominal Aortic Aneurysm: screening not needed for non-smokers

## 2019-10-07 LAB — COMPLETE METABOLIC PANEL WITH GFR
AG Ratio: 1.9 (calc) (ref 1.0–2.5)
ALT: 29 U/L (ref 9–46)
AST: 19 U/L (ref 10–35)
Albumin: 4.5 g/dL (ref 3.6–5.1)
Alkaline phosphatase (APISO): 55 U/L (ref 35–144)
BUN: 15 mg/dL (ref 7–25)
CO2: 25 mmol/L (ref 20–32)
Calcium: 9.6 mg/dL (ref 8.6–10.3)
Chloride: 104 mmol/L (ref 98–110)
Creat: 1.17 mg/dL (ref 0.70–1.33)
GFR, Est African American: 80 mL/min/{1.73_m2} (ref >=60)
GFR, Est Non African American: 69 mL/min/{1.73_m2} (ref >=60)
Globulin: 2.4 g/dL (calc) (ref 1.9–3.7)
Glucose, Bld: 108 mg/dL — ABNORMAL HIGH (ref 65–99)
Potassium: 4.5 mmol/L (ref 3.5–5.3)
Sodium: 135 mmol/L (ref 135–146)
Total Bilirubin: 0.5 mg/dL (ref 0.2–1.2)
Total Protein: 6.9 g/dL (ref 6.1–8.1)

## 2019-10-07 LAB — CBC
HCT: 44.8 % (ref 38.5–50.0)
Hemoglobin: 15.1 g/dL (ref 13.2–17.1)
MCH: 30.1 pg (ref 27.0–33.0)
MCHC: 33.7 g/dL (ref 32.0–36.0)
MCV: 89.2 fL (ref 80.0–100.0)
MPV: 10.3 fL (ref 7.5–12.5)
Platelets: 240 10*3/uL (ref 140–400)
RBC: 5.02 10*6/uL (ref 4.20–5.80)
RDW: 12.8 % (ref 11.0–15.0)
WBC: 4.3 10*3/uL (ref 3.8–10.8)

## 2019-10-07 LAB — LIPID PANEL
Cholesterol: 173 mg/dL (ref <200)
HDL: 54 mg/dL (ref >=40)
LDL Cholesterol (Calc): 97 mg/dL (calc)
Non-HDL Cholesterol (Calc): 119 mg/dL (calc) (ref <130)
Total CHOL/HDL Ratio: 3.2 (calc) (ref <5.0)
Triglycerides: 122 mg/dL (ref <150)

## 2019-10-07 LAB — PSA, TOTAL WITH REFLEX TO PSA, FREE: PSA, Total: 0.7 ng/mL (ref <=4.0)

## 2019-10-07 LAB — HEMOGLOBIN A1C W/OUT EAG: Hgb A1c MFr Bld: 5.2 % of total Hgb (ref <5.7)

## 2020-03-09 ENCOUNTER — Ambulatory Visit (INDEPENDENT_AMBULATORY_CARE_PROVIDER_SITE_OTHER): Payer: BC Managed Care – PPO

## 2020-03-09 ENCOUNTER — Ambulatory Visit (INDEPENDENT_AMBULATORY_CARE_PROVIDER_SITE_OTHER): Payer: BC Managed Care – PPO | Admitting: Sports Medicine

## 2020-03-09 ENCOUNTER — Other Ambulatory Visit: Payer: Self-pay

## 2020-03-09 DIAGNOSIS — M7551 Bursitis of right shoulder: Secondary | ICD-10-CM | POA: Insufficient documentation

## 2020-03-09 DIAGNOSIS — M545 Low back pain, unspecified: Secondary | ICD-10-CM

## 2020-03-09 DIAGNOSIS — G8929 Other chronic pain: Secondary | ICD-10-CM

## 2020-03-09 DIAGNOSIS — M25511 Pain in right shoulder: Secondary | ICD-10-CM | POA: Diagnosis not present

## 2020-03-09 MED ORDER — PREDNISONE 50 MG PO TABS: ORAL_TABLET | ORAL | 0 refills | Status: DC

## 2020-03-09 NOTE — Addendum Note (Signed)
Addended by: Monica Becton on: 03/09/2020 10:25 AM   Modules accepted: Orders

## 2020-03-09 NOTE — Addendum Note (Signed)
Addended by: Monica Becton on: 03/09/2020 10:26 AM   Modules accepted: Orders

## 2020-03-09 NOTE — Assessment & Plan Note (Addendum)
This is a pleasant 59 year old male, 1 month ago he was doing some landscaping, moving heavy rocks, unfortunately he developed severe pain in his right shoulder, localized over the deltoid worse with abduction. Due to the severity of his pain and his impingement signs on exam today we injected his subacromial bursa, adding x-rays, return to see me in 1 month. Of note he had a fully torn and retracted supraspinatus.

## 2020-03-09 NOTE — Assessment & Plan Note (Signed)
Adrian [name] is also injured his back doing work in his yard. Left-sided today. Adding a 5-day burst of prednisone, lumbar spine exercises, lumbar spine x-rays, return to see me in a month.

## 2020-03-09 NOTE — Progress Notes (Signed)
    Procedures performed today:    Procedure: Real-time Ultrasound Guided injection of the right subacromial bursa Device: Samsung HS60  Verbal informed consent obtained.  Time-out conducted.  Noted no overlying erythema, induration, or other signs of local infection.  Skin prepped in a sterile fashion.  Local anesthesia: Topical Ethyl chloride.  With sterile technique and under real time ultrasound guidance:  Noted torn and retracted supraspinatus, 1 cc Kenalog 40, 1 cc lidocaine, 1 cc bupivacaine injected easily Completed without difficulty  Pain immediately resolved suggesting accurate placement of the medication.  Advised to call if fevers/chills, erythema, induration, drainage, or persistent bleeding.  Images permanently stored and available for review in PACS.  Impression: Technically successful ultrasound guided injection.  Independent interpretation of notes and tests performed by another provider:   None.  Brief History, Exam, Impression, and Recommendations:    Acute shoulder bursitis, right This is a pleasant 59 year old male, 1 month ago he was doing some landscaping, moving heavy rocks, unfortunately he developed severe pain in his right shoulder, localized over the deltoid worse with abduction. Due to the severity of his pain and his impingement signs on exam today we injected his subacromial bursa, adding x-rays, return to see me in 1 month. Of note he had a fully torn and retracted supraspinatus.   Chronic right-sided low back pain without sciatica Adrian [name] is also injured his back doing work in his yard. Left-sided today. Adding a 5-day burst of prednisone, lumbar spine exercises, lumbar spine x-rays, return to see me in a month.    ___________________________________________ Adrian [name]. Adrian [name], M.D., ABFM., CAQSM. Primary Care and Sports Medicine [facility]  Adjunct Instructor of Family Medicine  [employer]

## 2020-04-04 ENCOUNTER — Ambulatory Visit: Payer: BC Managed Care – PPO | Admitting: Sports Medicine

## 2020-04-04 ENCOUNTER — Ambulatory Visit (INDEPENDENT_AMBULATORY_CARE_PROVIDER_SITE_OTHER): Payer: BC Managed Care – PPO

## 2020-04-04 ENCOUNTER — Ambulatory Visit (INDEPENDENT_AMBULATORY_CARE_PROVIDER_SITE_OTHER): Payer: BC Managed Care – PPO | Admitting: Osteopathic Medicine

## 2020-04-04 ENCOUNTER — Other Ambulatory Visit: Payer: Self-pay

## 2020-04-04 VITALS — BP 124/71 | HR 64

## 2020-04-04 DIAGNOSIS — M25512 Pain in left shoulder: Secondary | ICD-10-CM

## 2020-04-04 DIAGNOSIS — M7551 Bursitis of right shoulder: Secondary | ICD-10-CM

## 2020-04-04 DIAGNOSIS — I10 Essential (primary) hypertension: Secondary | ICD-10-CM | POA: Diagnosis not present

## 2020-04-04 DIAGNOSIS — M19012 Primary osteoarthritis, left shoulder: Secondary | ICD-10-CM | POA: Diagnosis not present

## 2020-04-04 DIAGNOSIS — M75122 Complete rotator cuff tear or rupture of left shoulder, not specified as traumatic: Secondary | ICD-10-CM | POA: Insufficient documentation

## 2020-04-04 DIAGNOSIS — M75121 Complete rotator cuff tear or rupture of right shoulder, not specified as traumatic: Secondary | ICD-10-CM | POA: Insufficient documentation

## 2020-04-04 NOTE — Progress Notes (Signed)
BP Readings from Last 3 Encounters:  04/04/20 124/71  10/03/19 131/86  08/08/19 (!) 144/89

## 2020-04-04 NOTE — Progress Notes (Signed)
Established Patient Office Visit  Subjective:  Patient ID: Adrian [name], male    DOB: 10-Jul-1960  Age: 59 y.o. MRN: [id number]  CC:  Chief Complaint  Patient presents with   Hypertension    HPI Adrian [name] presents for blood pressure check. Denies chest pain, shortness of breath or dizziness.   Past Medical History:  Diagnosis Date   Chronic right hip pain 08/06/2017   Chronic right-sided low back pain without sciatica 08/06/2017   GERD (gastroesophageal reflux disease)    Hypertension    Hypertriglyceridemia 08/06/2017   Seasonal allergies     Past Surgical History:  Procedure Laterality Date   ESOPHAGOGASTRODUODENOSCOPY     NO PAST SURGERIES      Family History  Problem Relation Age of Onset   Dementia Mother    Stroke Father    COPD Father    Heart failure Father    Asthma Father    Asthma Sister    Heart attack Paternal Uncle    Stroke Paternal Uncle     Social History   Socioeconomic History   Marital status: Married    Spouse name: Not on file   Number of children: Not on file   Years of education: Not on file   Highest education level: Not on file  Occupational History   Not on file  Tobacco Use   Smoking status: Never Smoker   Smokeless tobacco: Never Used  Substance and Sexual Activity   Alcohol use: No   Drug use: No   Sexual activity: Yes    Birth control/protection: None  Other Topics Concern   Not on file  Social History Narrative   Not on file   Social Determinants of Health   Financial Resource Strain:    Difficulty of Paying Living Expenses: Not on file  Food Insecurity:    Worried About Programme researcher, broadcasting/film/video in the Last Year: Not on file   [employer] of Food in the Last Year: Not on file  Transportation Needs:    Lack of Transportation (Medical): Not on file   Lack of Transportation (Non-Medical): Not on file  Physical Activity:    Days of Exercise per Week: Not on file   Minutes of  Exercise per Session: Not on file  Stress:    Feeling of Stress : Not on file  Social Connections:    Frequency of Communication with Friends and Family: Not on file   Frequency of Social Gatherings with Friends and Family: Not on file   Attends Religious Services: Not on file   Active Member of Clubs or Organizations: Not on file   Attends Banker Meetings: Not on file   Marital Status: Not on file  Intimate Partner Violence:    Fear of Current or Ex-Partner: Not on file   Emotionally Abused: Not on file   Physically Abused: Not on file   Sexually Abused: Not on file    Outpatient Medications Prior to Visit  Medication Sig Dispense Refill   aspirin EC 81 MG tablet Take 1 tablet (81 mg total) by mouth daily. 90 tablet 3   celecoxib (CELEBREX) 200 MG capsule Take 1 capsule (200 mg total) by mouth daily. Can take extra capsule as needed for moderate/severe pain 90 capsule 3   famotidine (PEPCID) 20 MG tablet Take 1 tablet (20 mg total) by mouth 2 (two) times daily as needed for heartburn or indigestion. 180 tablet 1   fluticasone (FLONASE) 50 MCG/ACT  nasal spray Place 1 spray into both nostrils daily. 48 g 3   hydrochlorothiazide (HYDRODIURIL) 12.5 MG tablet Take 1 tablet (12.5 mg total) by mouth daily. 90 tablet 3   loratadine (CLARITIN) 10 MG tablet Take 1 tablet (10 mg total) by mouth daily. 90 tablet 3   predniSONE (DELTASONE) 50 MG tablet One tab PO daily for 5 days. 5 tablet 0   No facility-administered medications prior to visit.    No Known Allergies  ROS Review of Systems    Objective:    Physical Exam  BP 124/71    Pulse 64    SpO2 98%  Wt Readings from Last 3 Encounters:  10/03/19 235 lb 1.3 oz (106.6 kg)  03/25/19 236 lb 1.9 oz (107.1 kg)  01/04/19 236 lb (107 kg)     Health Maintenance Due  Topic Date Due   COVID-19 Vaccine (1) Never done   INFLUENZA VACCINE  12/25/2019    There are no preventive care reminders to  display for this patient.  No results found for: TSH Lab Results  Component Value Date   WBC 4.3 10/03/2019   HGB 15.1 10/03/2019   HCT 44.8 10/03/2019   MCV 89.2 10/03/2019   PLT 240 10/03/2019   Lab Results  Component Value Date   NA 135 10/03/2019   K 4.5 10/03/2019   CO2 25 10/03/2019   GLUCOSE 108 (H) 10/03/2019   BUN 15 10/03/2019   CREATININE 1.17 10/03/2019   BILITOT 0.5 10/03/2019   AST 19 10/03/2019   ALT 29 10/03/2019   PROT 6.9 10/03/2019   CALCIUM 9.6 10/03/2019   Lab Results  Component Value Date   CHOL 173 10/03/2019   Lab Results  Component Value Date   HDL 54 10/03/2019   Lab Results  Component Value Date   LDLCALC 97 10/03/2019   Lab Results  Component Value Date   TRIG 122 10/03/2019   Lab Results  Component Value Date   CHOLHDL 3.2 10/03/2019   Lab Results  Component Value Date   HGBA1C 5.2 10/03/2019      Assessment & Plan:  Hypertension - BP controlled today. Patient advised to follow up in 6 months for a CPE with Dr Lyn Hollingshead. Advised to continue current medications.    Problem List Items Addressed This Visit    Hypertension goal BP (blood pressure) < 130/80 - Primary      No orders of the defined types were placed in this encounter.   Follow-up: Return in about 6 months (around 10/02/2020) for CPE with Dr Lyn Hollingshead.    Esmond Harps, CMA

## 2020-04-04 NOTE — Assessment & Plan Note (Signed)
Was pulling some branches about a week and a half ago, felt a sharp tearing pain in his left shoulder followed by bruising. He has trouble with abduction, positive impingement and bicipital signs today, today we did a subacromial injection, I would like to see him back in a week and if not having significant improvement we will get bilateral shoulder MRIs and a referral to Dr. Everardo Pacific.

## 2020-04-04 NOTE — Assessment & Plan Note (Signed)
Adrian [name] returns, he has a fully torn and retracted supraspinatus, we injected the subacromial bursa at the last visit approximately 3 weeks ago and he returns today doing significantly better, occasional twinges of pain. Good motion. He does have residual symptoms but would like to wait until the follow-up visit before considering MRI/surgery.

## 2020-04-04 NOTE — Progress Notes (Signed)
    Procedures performed today:    Procedure: Real-time Ultrasound Guided injection of the left subacromial bursa Device: Samsung HS60  Verbal informed consent obtained.  Time-out conducted.  Noted no overlying erythema, induration, or other signs of local infection.  Skin prepped in a sterile fashion.  Local anesthesia: Topical Ethyl chloride.  With sterile technique and under real time ultrasound guidance:  Noted torn and retracted supraspinatus with maybe a few fibers hanging on, long head of the biceps is intact and located in the bicipital groove, 1 cc Kenalog 40, 1 cc lidocaine, 1 cc bupivacaine injected easily Completed without difficulty  Advised to call if fevers/chills, erythema, induration, drainage, or persistent bleeding.  Images permanently stored and available for review in PACS.  Impression: Technically successful ultrasound guided injection.  Independent interpretation of notes and tests performed by another provider:   None.  Brief History, Exam, Impression, and Recommendations:    Acute shoulder bursitis, right Adrian [name], he has a fully torn and retracted supraspinatus, we injected the subacromial bursa at the last visit approximately 3 weeks ago and he [name] today doing significantly better, occasional twinges of pain. Good motion. He does have residual symptoms but would like to wait until the follow-up visit before considering MRI/surgery.  Acute pain of left shoulder Was pulling some branches about a week and a half ago, felt a sharp tearing pain in his left shoulder followed by bruising. He has trouble with abduction, positive impingement and bicipital signs today, today we did a subacromial injection, I would like to see him back in a week and if not having significant improvement we will get bilateral shoulder MRIs and a referral to Dr. Everardo Pacific.    ___________________________________________ Adrian [name]. Adrian [name], M.D., ABFM., CAQSM. Primary Care  and Sports Medicine [facility]  Adjunct Instructor of Family Medicine  [employer]

## 2020-04-12 ENCOUNTER — Ambulatory Visit: Payer: BC Managed Care – PPO | Admitting: Sports Medicine

## 2020-04-12 ENCOUNTER — Other Ambulatory Visit: Payer: Self-pay

## 2020-04-12 DIAGNOSIS — Z23 Encounter for immunization: Secondary | ICD-10-CM

## 2020-04-12 DIAGNOSIS — Z96619 Presence of unspecified artificial shoulder joint: Secondary | ICD-10-CM

## 2020-04-12 DIAGNOSIS — M75121 Complete rotator cuff tear or rupture of right shoulder, not specified as traumatic: Secondary | ICD-10-CM | POA: Diagnosis not present

## 2020-04-12 DIAGNOSIS — M75122 Complete rotator cuff tear or rupture of left shoulder, not specified as traumatic: Secondary | ICD-10-CM

## 2020-04-12 MED ORDER — TRIAZOLAM 0.25 MG PO TABS: ORAL_TABLET | ORAL | 0 refills | Status: DC

## 2020-04-12 NOTE — Progress Notes (Signed)
    Procedures performed today:    None.  Independent interpretation of notes and tests performed by another provider:   None.  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 Everardo Pacific.    ___________________________________________ Adrian [name]. Benjamin Stain, M.D., ABFM., CAQSM. Primary Care and Sports Medicine [facility]  Adjunct Instructor of Family Medicine  [employer]

## 2020-04-12 NOTE — Assessment & Plan Note (Signed)
Right-sided full-thickness retracted supraspinatus tear, likely left-sided rotator cuff tear, both structures have been injected without sufficient improvement, he has had some home physical therapy, due to persistent discomfort and surgical planning we are going to proceed with bilateral MRIs, triazolam for preprocedural anxiolysis, as well as referral to Dr. Everardo Pacific.

## 2020-04-16 ENCOUNTER — Other Ambulatory Visit: Payer: Self-pay | Admitting: Sports Medicine

## 2020-04-16 DIAGNOSIS — Z1389 Encounter for screening for other disorder: Secondary | ICD-10-CM

## 2020-04-16 NOTE — Telephone Encounter (Signed)
Approval obtained for bilateral shoulder MRIs, #[id number] expires 10/09/2020, lets schedule!

## 2020-04-16 NOTE — Telephone Encounter (Signed)
Imaging advised to contact patient for scheduling

## 2020-04-22 ENCOUNTER — Ambulatory Visit (INDEPENDENT_AMBULATORY_CARE_PROVIDER_SITE_OTHER): Payer: BC Managed Care – PPO

## 2020-04-22 ENCOUNTER — Other Ambulatory Visit: Payer: Self-pay

## 2020-04-22 DIAGNOSIS — M24112 Other articular cartilage disorders, left shoulder: Secondary | ICD-10-CM | POA: Diagnosis not present

## 2020-04-22 DIAGNOSIS — M24111 Other articular cartilage disorders, right shoulder: Secondary | ICD-10-CM | POA: Diagnosis not present

## 2020-04-22 DIAGNOSIS — Z01818 Encounter for other preprocedural examination: Secondary | ICD-10-CM | POA: Diagnosis not present

## 2020-04-22 DIAGNOSIS — S46012A Strain of muscle(s) and tendon(s) of the rotator cuff of left shoulder, initial encounter: Secondary | ICD-10-CM

## 2020-04-22 DIAGNOSIS — Z139 Encounter for screening, unspecified: Secondary | ICD-10-CM

## 2020-04-22 DIAGNOSIS — M25511 Pain in right shoulder: Secondary | ICD-10-CM | POA: Diagnosis not present

## 2020-04-22 DIAGNOSIS — Z1389 Encounter for screening for other disorder: Secondary | ICD-10-CM

## 2020-04-22 DIAGNOSIS — S46011A Strain of muscle(s) and tendon(s) of the rotator cuff of right shoulder, initial encounter: Secondary | ICD-10-CM | POA: Diagnosis not present

## 2020-04-22 DIAGNOSIS — M75122 Complete rotator cuff tear or rupture of left shoulder, not specified as traumatic: Secondary | ICD-10-CM | POA: Diagnosis not present

## 2020-04-24 DIAGNOSIS — M25512 Pain in left shoulder: Secondary | ICD-10-CM | POA: Diagnosis not present

## 2020-04-24 DIAGNOSIS — M25511 Pain in right shoulder: Secondary | ICD-10-CM | POA: Diagnosis not present

## 2020-05-08 DIAGNOSIS — M25511 Pain in right shoulder: Secondary | ICD-10-CM | POA: Diagnosis not present

## 2020-05-22 ENCOUNTER — Encounter (HOSPITAL_BASED_OUTPATIENT_CLINIC_OR_DEPARTMENT_OTHER): Payer: Self-pay | Admitting: Orthopaedic Surgery

## 2020-05-22 ENCOUNTER — Encounter: Payer: Self-pay | Admitting: Osteopathic Medicine

## 2020-05-22 ENCOUNTER — Other Ambulatory Visit: Payer: Self-pay

## 2020-05-28 ENCOUNTER — Other Ambulatory Visit (HOSPITAL_COMMUNITY)
Admission: RE | Admit: 2020-05-28 | Discharge: 2020-05-28 | Disposition: A | Discharge status: Home / Self Care | Payer: BC Managed Care – PPO | Source: Ambulatory Visit | Attending: Orthopaedic Surgery | Admitting: Orthopaedic Surgery

## 2020-05-28 ENCOUNTER — Encounter (HOSPITAL_BASED_OUTPATIENT_CLINIC_OR_DEPARTMENT_OTHER)
Admission: RE | Admit: 2020-05-28 | Discharge: 2020-05-28 | Disposition: A | Discharge status: Home / Self Care | Payer: BC Managed Care – PPO | Source: Ambulatory Visit | Attending: Orthopaedic Surgery | Admitting: Orthopaedic Surgery

## 2020-05-28 DIAGNOSIS — Z01818 Encounter for other preprocedural examination: Secondary | ICD-10-CM | POA: Insufficient documentation

## 2020-05-28 DIAGNOSIS — K219 Gastro-esophageal reflux disease without esophagitis: Secondary | ICD-10-CM | POA: Diagnosis not present

## 2020-05-28 DIAGNOSIS — Z01812 Encounter for preprocedural laboratory examination: Secondary | ICD-10-CM | POA: Insufficient documentation

## 2020-05-28 DIAGNOSIS — M7522 Bicipital tendinitis, left shoulder: Secondary | ICD-10-CM | POA: Diagnosis not present

## 2020-05-28 DIAGNOSIS — M75102 Unspecified rotator cuff tear or rupture of left shoulder, not specified as traumatic: Secondary | ICD-10-CM | POA: Diagnosis not present

## 2020-05-28 DIAGNOSIS — Z20822 Contact with and (suspected) exposure to covid-19: Secondary | ICD-10-CM | POA: Insufficient documentation

## 2020-05-28 DIAGNOSIS — I10 Essential (primary) hypertension: Secondary | ICD-10-CM | POA: Diagnosis not present

## 2020-05-28 DIAGNOSIS — M25812 Other specified joint disorders, left shoulder: Secondary | ICD-10-CM | POA: Diagnosis not present

## 2020-05-28 DIAGNOSIS — Z7982 Long term (current) use of aspirin: Secondary | ICD-10-CM | POA: Diagnosis not present

## 2020-05-28 DIAGNOSIS — X58XXXA Exposure to other specified factors, initial encounter: Secondary | ICD-10-CM | POA: Diagnosis not present

## 2020-05-28 DIAGNOSIS — Z79899 Other long term (current) drug therapy: Secondary | ICD-10-CM | POA: Diagnosis not present

## 2020-05-28 DIAGNOSIS — M19012 Primary osteoarthritis, left shoulder: Secondary | ICD-10-CM | POA: Diagnosis not present

## 2020-05-28 DIAGNOSIS — S43432A Superior glenoid labrum lesion of left shoulder, initial encounter: Secondary | ICD-10-CM | POA: Diagnosis not present

## 2020-05-28 DIAGNOSIS — Z791 Long term (current) use of non-steroidal anti-inflammatories (NSAID): Secondary | ICD-10-CM | POA: Diagnosis not present

## 2020-05-28 LAB — BASIC METABOLIC PANEL
Anion gap: 8 (ref 5–15)
BUN: 22 mg/dL — ABNORMAL HIGH (ref 6–20)
CO2: 25 mmol/L (ref 22–32)
Calcium: 9.3 mg/dL (ref 8.9–10.3)
Chloride: 106 mmol/L (ref 98–111)
Creatinine, Ser: 1.26 mg/dL — ABNORMAL HIGH (ref 0.61–1.24)
GFR, Estimated: >60 mL/min (ref >60)
Glucose, Bld: 98 mg/dL (ref 70–99)
Potassium: 4.5 mmol/L (ref 3.5–5.1)
Sodium: 139 mmol/L (ref 135–145)

## 2020-05-28 LAB — SARS CORONAVIRUS 2 (TAT 6-24 HRS): SARS Coronavirus 2: NEGATIVE

## 2020-05-28 NOTE — H&P (Signed)
PREOPERATIVE H&P  Chief Complaint: LEFT SHOLDER ARTHROSCOPY ARTICULAR CARTILAGE DISORDERS , OSTEOARTHRITIS IMPINGEMENT SYNDROME, STRAIN OF MUSCLES AND TENDONS OF ROTATOR CUFF  HPI: Adrian [name] is a 60 y.o. male who is scheduled for, Procedure(s): LEFT SHOULDER ARTHROSCOPY WITH DEBRIDEMENT, DISTAL CLAVICAL EXCISION,  SUBACROMIAL DECOMPRESSION, SUPERIOR CAPSULAR RECONSTRUCTION  AND BICEP TENODESIS.   Patient has a past medical history significant for HTN and GERD.   The patient is a 60 year old who has seen Dr. Benjamin Stain in the past.  He had bilateral shoulder injuries over the course of the last 6-8 months.  He is having trouble with his shoulders in general; the left is worse than the right.  He has decreased active range of motion.  He has had injections, physical therapy, and exercises and hasn't made much progress.  He is not happy with his function.    His symptoms are rated as moderate to severe, and have been worsening.  This is significantly impairing activities of daily living.    Please see clinic note for further details on this patient's care.    He has elected for surgical management.   Past Medical History:  Diagnosis Date  . Chronic right hip pain 08/06/2017  . Chronic right-sided low back pain without sciatica 08/06/2017  . GERD (gastroesophageal reflux disease)   . Hypertension   . Hypertriglyceridemia 08/06/2017  . Seasonal allergies    Past Surgical History:  Procedure Laterality Date  . ESOPHAGOGASTRODUODENOSCOPY    . NO PAST SURGERIES     Social History   Socioeconomic History  . Marital status: Married    Spouse name: Not on file  . Number of children: Not on file  . Years of education: Not on file  . Highest education level: Not on file  Occupational History  . Not on file  Tobacco Use  . Smoking status: Never Smoker  . Smokeless tobacco: Never Used  Vaping Use  . Vaping Use: Never used  Substance and Sexual Activity  . Alcohol use: No   . Drug use: No  . Sexual activity: Yes    Birth control/protection: None  Other Topics Concern  . Not on file  Social History Narrative  . Not on file   Social Determinants of Health   Financial Resource Strain: Not on file  Food Insecurity: Not on file  Transportation Needs: Not on file  Physical Activity: Not on file  Stress: Not on file  Social Connections: Not on file   Family History  Problem Relation Age of Onset  . Dementia Mother   . Stroke Father   . COPD Father   . Heart failure Father   . Asthma Father   . Asthma Sister   . Heart attack Paternal Uncle   . Stroke Paternal Uncle    No Known Allergies Prior to Admission medications   Medication Sig Start Date End Date Taking? Authorizing Provider  aspirin EC 81 MG tablet Take 1 tablet (81 mg total) by mouth daily. 07/09/17  Yes Carlis Stable, PA-C  celecoxib (CELEBREX) 200 MG capsule Take 1 capsule (200 mg total) by mouth daily. Can take extra capsule as needed for moderate/severe pain 10/03/19  Yes Sunnie Nielsen, DO  famotidine (PEPCID) 20 MG tablet Take 1 tablet (20 mg total) by mouth 2 (two) times daily as needed for heartburn or indigestion. 10/03/19  Yes Sunnie Nielsen, DO  fluticasone (FLONASE) 50 MCG/ACT nasal spray Place 1 spray into both nostrils daily. 10/03/19  Yes Sunnie Nielsen,  DO  hydrochlorothiazide (HYDRODIURIL) 12.5 MG tablet Take 1 tablet (12.5 mg total) by mouth daily. 10/03/19  Yes Sunnie Nielsen, DO  loratadine (CLARITIN) 10 MG tablet Take 1 tablet (10 mg total) by mouth daily. 10/03/19  Yes Sunnie Nielsen, DO  meloxicam (MOBIC) 15 MG tablet Take 7.5 mg by mouth daily.   Yes [provider]    ROS: All other systems have been reviewed and were otherwise negative with the exception of those mentioned in the HPI and as above.  Physical Exam: General: Alert, no acute distress Cardiovascular: No pedal edema Respiratory: No cyanosis, no use of accessory  musculature GI: No organomegaly, abdomen is soft and non-tender Skin: No lesions in the area of chief complaint Neurologic: Sensation intact distally Psychiatric: Patient is competent for consent with normal mood and affect Lymphatic: No axillary or cervical lymphadenopathy  MUSCULOSKELETAL:  Left shoulder demonstrates active forward elevation to 140, passive to 150, external rotation to 45.  Cuff strength is weak throughout.  Distal motor and sensory function is intact.    Imaging: Imaging demonstrates massive rotator cuff tears involving the supraspinatus and infraspinatus retracted past the level of the apex of the humeral head.  No obvious muscle atrophy noted.    Assessment: LEFT SHOLDER ARTHROSCOPY ARTICULAR CARTILAGE DISORDERS , OSTEOARTHRITIS IMPINGEMENT SYNDROME, STRAIN OF MUSCLES AND TENDONS OF ROTATOR CUFF  Plan: Plan for Procedure(s): LEFT SHOULDER ARTHROSCOPY WITH DEBRIDEMENT, DISTAL CLAVICAL EXCISION,  SUBACROMIAL DECOMPRESSION, SUPERIOR CAPSULAR RECONSTRUCTION  AND BICEP TENODESIS  The risks benefits and alternatives were discussed with the patient including but not limited to the risks of nonoperative treatment, versus surgical intervention including infection, bleeding, nerve injury,  blood clots, cardiopulmonary complications, morbidity, mortality, among others, and they were willing to proceed.   The patient acknowledged the explanation, agreed to proceed with the plan and consent was signed.   Operative Plan: Left shoulder scope with SAD, DCE, BT, RCR versus SCR Discharge Medications: Tylenol, Celebrex, Oxycodone, Zofran DVT Prophylaxis: None Physical Therapy: Outpatient PT Special Discharge needs: Sling   Vernetta Honey, PA-C  05/28/2020 12:46 PM

## 2020-05-28 NOTE — Progress Notes (Signed)

## 2020-05-31 ENCOUNTER — Ambulatory Visit (HOSPITAL_BASED_OUTPATIENT_CLINIC_OR_DEPARTMENT_OTHER): Payer: BC Managed Care – PPO | Admitting: Anesthesiology

## 2020-05-31 ENCOUNTER — Ambulatory Visit (HOSPITAL_BASED_OUTPATIENT_CLINIC_OR_DEPARTMENT_OTHER)
Admission: RE | Admit: 2020-05-31 | Discharge: 2020-05-31 | Disposition: A | Discharge status: Home / Self Care | Payer: BC Managed Care – PPO | Attending: Orthopaedic Surgery | Admitting: Orthopaedic Surgery

## 2020-05-31 ENCOUNTER — Other Ambulatory Visit: Payer: Self-pay

## 2020-05-31 ENCOUNTER — Encounter (HOSPITAL_BASED_OUTPATIENT_CLINIC_OR_DEPARTMENT_OTHER)
Admission: RE | Disposition: A | Discharge status: Home / Self Care | Payer: Self-pay | Source: Home / Self Care | Attending: Orthopaedic Surgery

## 2020-05-31 ENCOUNTER — Encounter (HOSPITAL_BASED_OUTPATIENT_CLINIC_OR_DEPARTMENT_OTHER): Payer: Self-pay | Admitting: Orthopaedic Surgery

## 2020-05-31 DIAGNOSIS — S43432A Superior glenoid labrum lesion of left shoulder, initial encounter: Secondary | ICD-10-CM | POA: Insufficient documentation

## 2020-05-31 DIAGNOSIS — M25812 Other specified joint disorders, left shoulder: Secondary | ICD-10-CM | POA: Insufficient documentation

## 2020-05-31 DIAGNOSIS — K219 Gastro-esophageal reflux disease without esophagitis: Secondary | ICD-10-CM | POA: Diagnosis not present

## 2020-05-31 DIAGNOSIS — I10 Essential (primary) hypertension: Secondary | ICD-10-CM | POA: Insufficient documentation

## 2020-05-31 DIAGNOSIS — Z20822 Contact with and (suspected) exposure to covid-19: Secondary | ICD-10-CM | POA: Insufficient documentation

## 2020-05-31 DIAGNOSIS — Z7982 Long term (current) use of aspirin: Secondary | ICD-10-CM | POA: Diagnosis not present

## 2020-05-31 DIAGNOSIS — M75102 Unspecified rotator cuff tear or rupture of left shoulder, not specified as traumatic: Secondary | ICD-10-CM | POA: Insufficient documentation

## 2020-05-31 DIAGNOSIS — M24112 Other articular cartilage disorders, left shoulder: Secondary | ICD-10-CM | POA: Diagnosis not present

## 2020-05-31 DIAGNOSIS — Z791 Long term (current) use of non-steroidal anti-inflammatories (NSAID): Secondary | ICD-10-CM | POA: Insufficient documentation

## 2020-05-31 DIAGNOSIS — Z79899 Other long term (current) drug therapy: Secondary | ICD-10-CM | POA: Diagnosis not present

## 2020-05-31 DIAGNOSIS — M7542 Impingement syndrome of left shoulder: Secondary | ICD-10-CM | POA: Diagnosis not present

## 2020-05-31 DIAGNOSIS — M19012 Primary osteoarthritis, left shoulder: Secondary | ICD-10-CM | POA: Insufficient documentation

## 2020-05-31 DIAGNOSIS — S46012A Strain of muscle(s) and tendon(s) of the rotator cuff of left shoulder, initial encounter: Secondary | ICD-10-CM | POA: Diagnosis not present

## 2020-05-31 DIAGNOSIS — G8918 Other acute postprocedural pain: Secondary | ICD-10-CM | POA: Diagnosis not present

## 2020-05-31 DIAGNOSIS — X58XXXA Exposure to other specified factors, initial encounter: Secondary | ICD-10-CM | POA: Diagnosis not present

## 2020-05-31 DIAGNOSIS — M7522 Bicipital tendinitis, left shoulder: Secondary | ICD-10-CM | POA: Insufficient documentation

## 2020-05-31 HISTORY — PX: SHOULDER ARTHROSCOPY WITH SUBACROMIAL DECOMPRESSION AND BICEP TENDON REPAIR: SHX5689

## 2020-05-31 SURGERY — SHOULDER ARTHROSCOPY WITH SUBACROMIAL DECOMPRESSION AND BICEP TENDON REPAIR
Anesthesia: General | Site: Shoulder | Laterality: Left

## 2020-05-31 MED ORDER — ONDANSETRON HCL 4 MG/2ML IJ SOLN
INTRAMUSCULAR | Status: AC
  Filled 2020-05-31: qty 2

## 2020-05-31 MED ORDER — OXYCODONE HCL 5 MG/5ML PO SOLN: 5.0000 mg | Freq: Once | ORAL | Status: DC | PRN

## 2020-05-31 MED ORDER — CEFAZOLIN SODIUM-DEXTROSE 2-4 GM/100ML-% IV SOLN
INTRAVENOUS | Status: AC
  Filled 2020-05-31: qty 100

## 2020-05-31 MED ORDER — ONDANSETRON HCL 4 MG/2ML IJ SOLN
INTRAMUSCULAR | Status: DC | PRN
  Administered 2020-05-31: 4 mg via INTRAVENOUS

## 2020-05-31 MED ORDER — MIDAZOLAM HCL 2 MG/2ML IJ SOLN
INTRAMUSCULAR | Status: AC
  Filled 2020-05-31: qty 2

## 2020-05-31 MED ORDER — EPINEPHRINE PF 1 MG/ML IJ SOLN
INTRAMUSCULAR | Status: AC
  Filled 2020-05-31: qty 3

## 2020-05-31 MED ORDER — OXYCODONE HCL 5 MG PO TABS: ORAL_TABLET | ORAL | 0 refills | Status: AC

## 2020-05-31 MED ORDER — MEPERIDINE HCL 25 MG/ML IJ SOLN: 6.2500 mg | INTRAMUSCULAR | Status: DC | PRN

## 2020-05-31 MED ORDER — SODIUM CHLORIDE 0.9 % IV SOLN
INTRAVENOUS | Status: DC | PRN
  Administered 2020-05-31: 25 ug/min via INTRAVENOUS

## 2020-05-31 MED ORDER — ACETAMINOPHEN 160 MG/5ML PO SOLN: 325.0000 mg | ORAL | Status: DC | PRN

## 2020-05-31 MED ORDER — PROMETHAZINE HCL 25 MG/ML IJ SOLN
INTRAMUSCULAR | Status: AC
  Filled 2020-05-31: qty 1

## 2020-05-31 MED ORDER — CELECOXIB 200 MG PO CAPS: 200.0000 mg | ORAL_CAPSULE | Freq: Two times a day (BID) | ORAL | 0 refills | Status: DC

## 2020-05-31 MED ORDER — EPHEDRINE SULFATE 50 MG/ML IJ SOLN
INTRAMUSCULAR | Status: DC | PRN
  Administered 2020-05-31: 15 mg via INTRAVENOUS

## 2020-05-31 MED ORDER — LACTATED RINGERS IV SOLN: INTRAVENOUS | Status: DC

## 2020-05-31 MED ORDER — PROMETHAZINE HCL 25 MG/ML IJ SOLN
INTRAMUSCULAR | Status: DC | PRN
  Administered 2020-05-31: 12.5 mg via INTRAVENOUS

## 2020-05-31 MED ORDER — PROPOFOL 10 MG/ML IV BOLUS
INTRAVENOUS | Status: DC | PRN
  Administered 2020-05-31: 200 mg via INTRAVENOUS

## 2020-05-31 MED ORDER — FENTANYL CITRATE (PF) 100 MCG/2ML IJ SOLN
INTRAMUSCULAR | Status: DC | PRN
  Administered 2020-05-31: 100 ug via INTRAVENOUS

## 2020-05-31 MED ORDER — BUPIVACAINE HCL (PF) 0.5 % IJ SOLN
INTRAMUSCULAR | Status: DC | PRN
  Administered 2020-05-31: 15 mL via PERINEURAL

## 2020-05-31 MED ORDER — LIDOCAINE 2% (20 MG/ML) 5 ML SYRINGE
INTRAMUSCULAR | Status: AC
  Filled 2020-05-31: qty 5

## 2020-05-31 MED ORDER — SODIUM CHLORIDE 0.9 % IR SOLN: Status: DC | PRN

## 2020-05-31 MED ORDER — ONDANSETRON HCL 4 MG PO TABS: 4.0000 mg | ORAL_TABLET | Freq: Three times a day (TID) | ORAL | 0 refills | Status: AC | PRN

## 2020-05-31 MED ORDER — FENTANYL CITRATE (PF) 100 MCG/2ML IJ SOLN
50.0000 ug | Freq: Once | INTRAMUSCULAR | Status: AC
  Administered 2020-05-31: 100 ug via INTRAVENOUS

## 2020-05-31 MED ORDER — FENTANYL CITRATE (PF) 100 MCG/2ML IJ SOLN
INTRAMUSCULAR | Status: AC
  Filled 2020-05-31: qty 2

## 2020-05-31 MED ORDER — DEXAMETHASONE SODIUM PHOSPHATE 10 MG/ML IJ SOLN
INTRAMUSCULAR | Status: AC
  Filled 2020-05-31: qty 1

## 2020-05-31 MED ORDER — MIDAZOLAM HCL 2 MG/2ML IJ SOLN
1.0000 mg | Freq: Once | INTRAMUSCULAR | Status: AC
  Administered 2020-05-31: 2 mg via INTRAVENOUS

## 2020-05-31 MED ORDER — OXYCODONE HCL 5 MG PO TABS
5.0000 mg | ORAL_TABLET | Freq: Once | ORAL | Status: DC | PRN
Start: 2020-05-31 — End: 2020-05-31

## 2020-05-31 MED ORDER — ACETAMINOPHEN 325 MG PO TABS: 325.0000 mg | ORAL_TABLET | ORAL | Status: DC | PRN

## 2020-05-31 MED ORDER — ONDANSETRON HCL 4 MG/2ML IJ SOLN
4.0000 mg | Freq: Once | INTRAMUSCULAR | Status: AC | PRN
  Administered 2020-05-31: 4 mg via INTRAVENOUS

## 2020-05-31 MED ORDER — BUPIVACAINE LIPOSOME 1.3 % IJ SUSP
INTRAMUSCULAR | Status: DC | PRN
  Administered 2020-05-31: 10 mL via PERINEURAL

## 2020-05-31 MED ORDER — DEXAMETHASONE SODIUM PHOSPHATE 4 MG/ML IJ SOLN
INTRAMUSCULAR | Status: DC | PRN
  Administered 2020-05-31: 5 mg via INTRAVENOUS

## 2020-05-31 MED ORDER — ROCURONIUM BROMIDE 100 MG/10ML IV SOLN
INTRAVENOUS | Status: DC | PRN
  Administered 2020-05-31: 70 mg via INTRAVENOUS
  Administered 2020-05-31: 20 mg via INTRAVENOUS

## 2020-05-31 MED ORDER — CEFAZOLIN SODIUM-DEXTROSE 2-4 GM/100ML-% IV SOLN
2.0000 g | INTRAVENOUS | Status: AC
  Administered 2020-05-31: 2 g via INTRAVENOUS

## 2020-05-31 MED ORDER — FENTANYL CITRATE (PF) 100 MCG/2ML IJ SOLN: 25.0000 ug | INTRAMUSCULAR | Status: DC | PRN

## 2020-05-31 MED ORDER — ACETAMINOPHEN 500 MG PO TABS: 1000.0000 mg | ORAL_TABLET | Freq: Three times a day (TID) | ORAL | 0 refills | Status: AC

## 2020-05-31 MED ORDER — SUGAMMADEX SODIUM 500 MG/5ML IV SOLN
INTRAVENOUS | Status: DC | PRN
  Administered 2020-05-31: 400 mg via INTRAVENOUS

## 2020-05-31 MED ORDER — PROPOFOL 10 MG/ML IV BOLUS
INTRAVENOUS | Status: AC
  Filled 2020-05-31: qty 20

## 2020-05-31 SURGICAL SUPPLY — 61 items
ANCHOR SUT 1.8 FBRTK KNTLS 2SU (Anchor) ×4 IMPLANT
ANCHOR SUT BIO SW 4.75X19.1 (Anchor) ×2 IMPLANT
BLADE EXCALIBUR 4.0X13 (MISCELLANEOUS) ×2 IMPLANT
BLADE SURG 10 STRL SS (BLADE) IMPLANT
BURR OVAL 8 FLU 4.0X13 (MISCELLANEOUS) IMPLANT
CANNULA 5.75X71 LONG (CANNULA) IMPLANT
CANNULA PASSPORT 5 (CANNULA) IMPLANT
CANNULA PASSPORT BUTTON 10-40 (CANNULA) ×2 IMPLANT
CANNULA TWIST IN 8.25X7CM (CANNULA) IMPLANT
CHLORAPREP W/TINT 26 (MISCELLANEOUS) ×2 IMPLANT
CLSR STERI-STRIP ANTIMIC 1/2X4 (GAUZE/BANDAGES/DRESSINGS) ×2 IMPLANT
COOLER ICEMAN CLASSIC (MISCELLANEOUS) ×2 IMPLANT
COVER WAND RF STERILE (DRAPES) IMPLANT
DRAPE IMP U-DRAPE 54X76 (DRAPES) ×2 IMPLANT
DRAPE INCISE IOBAN 66X45 STRL (DRAPES) IMPLANT
DRAPE SHOULDER BEACH CHAIR (DRAPES) ×2 IMPLANT
DRSG PAD ABDOMINAL 8X10 ST (GAUZE/BANDAGES/DRESSINGS) ×2 IMPLANT
DW OUTFLOW CASSETTE/TUBE SET (MISCELLANEOUS) ×2 IMPLANT
GAUZE SPONGE 4X4 12PLY STRL (GAUZE/BANDAGES/DRESSINGS) ×2 IMPLANT
GLOVE BIOGEL PI IND STRL 7.0 (GLOVE) ×2 IMPLANT
GLOVE BIOGEL PI INDICATOR 7.0 (GLOVE) ×2
GLOVE ECLIPSE 6.5 STRL STRAW (GLOVE) ×4 IMPLANT
GLOVE ECLIPSE 8.0 STRL XLNG CF (GLOVE) ×2 IMPLANT
GLOVE SRG 8 PF TXTR STRL LF DI (GLOVE) ×1 IMPLANT
GLOVE SURG ENC MOIS LTX SZ6.5 (GLOVE) ×2 IMPLANT
GLOVE SURG UNDER POLY LF SZ6.5 (GLOVE) ×2 IMPLANT
GLOVE SURG UNDER POLY LF SZ8 (GLOVE) ×2
GOWN STRL REUS W/ TWL LRG LVL3 (GOWN DISPOSABLE) ×3 IMPLANT
GOWN STRL REUS W/TWL LRG LVL3 (GOWN DISPOSABLE) ×6
GOWN STRL REUS W/TWL XL LVL3 (GOWN DISPOSABLE) ×2 IMPLANT
IMPL SPEEDBRIDGE KIT (Orthopedic Implant) ×1 IMPLANT
IMPLANT SPEEDBRIDGE KIT (Orthopedic Implant) ×2 IMPLANT
KIT STABILIZATION SHOULDER (MISCELLANEOUS) ×2 IMPLANT
KIT STR SPEAR 1.8 FBRTK DISP (KITS) IMPLANT
LASSO CRESCENT QUICKPASS (SUTURE) IMPLANT
MANIFOLD NEPTUNE II (INSTRUMENTS) ×2 IMPLANT
NDL SAFETY ECLIPSE 18X1.5 (NEEDLE) ×1 IMPLANT
NEEDLE HYPO 18GX1.5 SHARP (NEEDLE) ×2
NEEDLE SCORPION MULTI FIRE (NEEDLE) IMPLANT
PACK ARTHROSCOPY DSU (CUSTOM PROCEDURE TRAY) ×2 IMPLANT
PACK BASIN DAY SURGERY FS (CUSTOM PROCEDURE TRAY) ×2 IMPLANT
PAD COLD SHLDR WRAP-ON (PAD) ×2 IMPLANT
PAD ORTHO SHOULDER 7X19 LRG (SOFTGOODS) ×2 IMPLANT
PORT APPOLLO RF 90DEGREE MULTI (SURGICAL WAND) ×2 IMPLANT
RESTRAINT HEAD UNIVERSAL NS (MISCELLANEOUS) ×2 IMPLANT
SHEET MEDIUM DRAPE 40X70 STRL (DRAPES) IMPLANT
SLEEVE SCD COMPRESS KNEE MED (MISCELLANEOUS) ×2 IMPLANT
SLING ARM FOAM STRAP LRG (SOFTGOODS) IMPLANT
SUT FIBERWIRE #2 38 T-5 BLUE (SUTURE)
SUT MNCRL AB 4-0 PS2 18 (SUTURE) ×2 IMPLANT
SUT PDS AB 1 CT  36 (SUTURE)
SUT PDS AB 1 CT 36 (SUTURE) IMPLANT
SUT TIGER TAPE 7 IN WHITE (SUTURE) IMPLANT
SUTURE FIBERWR #2 38 T-5 BLUE (SUTURE) IMPLANT
SUTURE TAPE TIGERLINK 1.3MM BL (SUTURE) ×1 IMPLANT
SUTURETAPE TIGERLINK 1.3MM BL (SUTURE) ×2
SYR 5ML LL (SYRINGE) ×2 IMPLANT
TAPE FIBER 2MM 7IN #2 BLUE (SUTURE) ×2 IMPLANT
TOWEL GREEN STERILE FF (TOWEL DISPOSABLE) ×4 IMPLANT
TUBE CONNECTING 20X1/4 (TUBING) ×2 IMPLANT
TUBING ARTHROSCOPY IRRIG 16FT (MISCELLANEOUS) ×2 IMPLANT

## 2020-05-31 NOTE — Anesthesia Preprocedure Evaluation (Addendum)
Anesthesia Evaluation  Patient identified by MRN, date of birth, ID band Patient awake    Reviewed: Allergy & Precautions, H&P , NPO status , Patient's Chart, lab work & pertinent test results, reviewed documented beta blocker date and time   Airway Mallampati: II  TM Distance: >3 FB Neck ROM: full    Dental no notable dental hx. (+) Teeth Intact, Dental Advisory Given   Pulmonary neg pulmonary ROS,    Pulmonary exam normal breath sounds clear to auscultation       Cardiovascular Exercise Tolerance: Good hypertension, negative cardio ROS   Rhythm:regular Rate:Normal     Neuro/Psych negative neurological ROS  negative psych ROS   GI/Hepatic Neg liver ROS, GERD  Medicated,  Endo/Other  Morbid obesity  Renal/GU negative Renal ROS  negative genitourinary   Musculoskeletal  (+) Arthritis , Osteoarthritis,    Abdominal   Peds  Hematology negative hematology ROS (+)   Anesthesia Other Findings   Reproductive/Obstetrics negative OB ROS                             Anesthesia Physical Anesthesia Plan  ASA: II  Anesthesia Plan: General   Post-op Pain Management: GA combined w/ Regional for post-op pain   Induction:   PONV Risk Score and Plan: 2 and Ondansetron, Midazolam and Dexamethasone  Airway Management Planned: Oral ETT and LMA  Additional Equipment: None  Intra-op Plan:   Post-operative Plan: Extubation in OR  Informed Consent: I have reviewed the patients History and Physical, chart, labs and discussed the procedure including the risks, benefits and alternatives for the proposed anesthesia with the patient or authorized representative who has indicated his/her understanding and acceptance.     Dental Advisory Given  Plan Discussed with: CRNA and Anesthesiologist  Anesthesia Plan Comments: (Discussed both nerve block for pain relief post-op and GA; including NV, sore  throat, dental injury, and pulmonary complications)        Anesthesia Quick Evaluation

## 2020-05-31 NOTE — Interval H&P Note (Signed)
History and Physical Interval Note:  05/31/2020 10:53 AM  Adrian [name]  has presented today for surgery, with the diagnosis of LEFT SHOLDER ARTHROSCOPY ARTICULAR CARTILAGE DISORDERS , OSTEOARTHRITIS IMPINGEMENT SYNDROME, STRAIN OF MUSCLES AND TENDONS OF ROTATOR CUFF.  The various methods of treatment have been discussed with the patient and family. After consideration of risks, benefits and other options for treatment, the patient has consented to  Procedure(s): LEFT SHOULDER ARTHROSCOPY WITH DEBRIDEMENT, DISTAL CLAVICAL EXCISION,  SUBACROMIAL DECOMPRESSION, SUPERIOR CAPSULAR RECONSTRUCTION  AND BICEP TENODESIS (Left) as a surgical intervention.  The patient's history has been reviewed, patient examined, no change in status, stable for surgery.  I have reviewed the patient's chart and labs.  Questions were answered to the patient's satisfaction.     [name]

## 2020-05-31 NOTE — Op Note (Signed)
Adrian [name] (CSN: [id number])  LEDARIUS [name]  1961/01/02 Date of Surgery: 05/31/2020   Diagnoses:  Older cuff tear, AC arthrosis, impingement and biceps tendinitis  Procedure: Arthroscopic extensive debridement Arthroscopic subacromial decompression Arthroscopic rotator cuff repair Arthroscopic biceps tenodesis Arthroscopic distal clavicle excision   Operative Finding Exam under anesthesia: Full motion no limitation Articular space: No loose bodies, capsule intact, anterior and superior labral fraying. Chondral surfaces:Intact, no sign of chondral degeneration on the glenoid or humeral head Biceps: Type II SLAP tear with significant redness and fraying of the biceps Subscapularis: Intact Superior Cuff: Retracted the level of the humeral head, good tissue quality overall Bursal side: As above  Successful completion of the planned procedure.  We are prepared for superior capsular reconstruction however the patient's tissue was amenable to repair and we felt this is in his best interest.  If he failed he would be a candidate for superior capsular reconstruction versus reverse social arthroplasty as his contralateral side may require reverse total shoulder arthroplasty.   Post-operative plan: The patient will be non-weightbearing in a sling for 6 weeks with therapy to start at week 4.  The patient will be discharged home.  DVT prophylaxis not indicated in ambulatory upper extremity patient without known risk factors.   Pain control with PRN pain medication preferring oral medicines.  Follow up plan will be scheduled in approximately 7 days for incision check and XR.  Post-Op Diagnosis: Same Surgeons:Primary: Adrian Gash, MD Assistants:Adrian McBane PA-C Location: [facility] ROOM 6 Anesthesia: General with Exparel interscalene block Antibiotics: Ancef 2 g Tourniquet time: None Estimated Blood Loss: Minimal Complications: None Specimens: None Implants: Implant  Name Type Inv. Item Serial No. Manufacturer Lot No. LRB No. Used Action  ANCHOR SUT 1.8 FIBERTAK 2 SUT - [id number] Anchor ANCHOR SUT 1.8 FIBERTAK 2 SUT  [employer] [id number] Left 1 Implanted  ANCHOR SUT 1.8 FIBERTAK 2 SUT - [id number] Anchor ANCHOR SUT 1.8 FIBERTAK 2 SUT  [employer] [id number] Left 1 Implanted  ANCHOR SUT BIO SW 4.75X19.1 - [id number] Anchor ANCHOR SUT BIO SW 4.75X19.1  [employer] [id number] Left 1 Implanted  IMPLANT SPEEDBRIDGE KIT - [id number] Orthopedic Implant IMPLANT SPEEDBRIDGE KIT  [place] [id number] Left 1 Implanted    Indications for Surgery:   Adrian [name] is a 60 y.o. male with continued shoulder pain refractory to nonoperative measures for extended period of time.    The risks and benefits were explained at length including but not limited to continued pain, cuff failure, biceps tenodesis failure, stiffness, need for further surgery and infection.   Procedure:   Patient was correctly identified in the preoperative holding area and operative site marked.  Patient brought to OR and positioned beachchair on an Roosevelt table ensuring that all bony prominences were padded and the head was in an appropriate location.  Anesthesia was induced and the operative shoulder was prepped and draped in the usual sterile fashion.  Timeout was called preincision.  A standard posterior viewing portal was made after localizing the portal with a spinal needle.  An anterior accessory portal was also made.  After clearing the articular space the camera was positioned in the subacromial space.  Findings above.    Extensive debridement was performed of the anterior interval tissue, labral fraying and the bursa.  Subacromial decompression: We made a lateral portal with spinal needle guidance. We then proceeded to debride bursal tissue extensively with a shaver and arthrocare device. At that point we continued to  identify the borders of the acromion and identify the spur. We then carefully  preserved the deltoid fascia and used a burr to convert the acromion to a Type 1 flat acromion without issue.  Biceps tenodesis: We marked the tendon and then performed a tenotomy and debridement of the stump in the articular space. We then identified the biceps tendon in its groove suprapec with the arthroscope in the lateral portal taking care to move from lateral to medial to avoid injury to the subscapularis. At that point we unroofed the tendon itself and mobilized it. An accessory anterior portal was made in line with the tendon and we grasped it from the anterior superior portal and worked from the accessory anterior portal. Two Fibertak 1.53m knotless anchors were placed in the groove and the tendon was secured in a luggage loop style fashion with a pass of the limb of suture through the tendon using a scorpion device to avoid pull-through.  Repair was completed with good tension on the tendon.  Residual stump of the tendon was removed after being resected with a RF ablator.  Distal Clavicle resection:  The scope was placed in the subacromial space from the posterior portal.  A hemostat was placed through the anterior portal and [facility].  A burr was then inserted and 10 mm of distal clavicle was resected taking care to avoid damage to the capsule around the joint and avoiding overhanging bone posteriorly.    Arthroscopic Rotator Cuff Repair: Tuberosity was prepared with a burr to a bleeding bed.  Following completion of the above we placed 3 4.7 Swivelock anchor loaded with a tape at inserted at the medial articular margin and an scorpion suture passing device, shuttled  sutures medially in a horizontal mattress suture configuration.  We then tied using arthroscopic knot tying techniques  each suture to its partner reducing the tendon at the prepared insertion site.  The fiber tape was not tied. With a medial row suture limbs then incorporated, 3 anteriorly and  3 posteriorly, into  each of two 4.75 PEEK SwiveLock anchors, each placed 8 to 10 mm below the tip of the tuberosity and spanning anterior-posterior width of the tear with care to avoid over tensioning.    The incisions were closed with absorbable monocryl and steri strips.  A sterile dressing was placed along with a sling. The patient was awoken from general anesthesia and taken to the PACU in stable condition without complication.   CNoemi Chapel PA-C, present and scrubbed throughout the case, critical for completion in a timely fashion, and for retraction, instrumentation, closure.

## 2020-05-31 NOTE — Anesthesia Procedure Notes (Signed)
Anesthesia Regional Block: Interscalene brachial plexus block   Pre-Anesthetic Checklist: ,, timeout performed, Correct Patient, Correct Site, Correct Laterality, Correct Procedure, Correct Position, site marked, Risks and benefits discussed,  Surgical consent,  Pre-op evaluation,  At surgeon's request and post-op pain management  Laterality: Left  Prep: chloraprep       Needles:  Injection technique: Single-shot  Needle Type: Echogenic Stimulator Needle     Needle Length: 5cm  Needle Gauge: 22     Additional Needles:   Procedures:, nerve stimulator,,, ultrasound used (permanent image in chart),,,,   Nerve Stimulator or Paresthesia:  Response: HAND, 0.45 mA,   Additional Responses:   Narrative:  Start time: 05/31/2020 10:25 AM End time: 05/31/2020 10:31 AM Injection made incrementally with aspirations every 5 mL.  Performed by: Personally  Anesthesiologist: Bethena Midget, MD  Additional Notes: Functioning IV was confirmed and monitors were applied.  A 33mm 22ga Arrow echogenic stimulator needle was used. Sterile prep and drape,hand hygiene and sterile gloves were used. Ultrasound guidance: relevant anatomy identified, needle position confirmed, local anesthetic spread visualized around nerve(s)., vascular puncture avoided.  Image printed for medical record. Negative aspiration and negative test dose prior to incremental administration of local anesthetic. The patient tolerated the procedure well.

## 2020-05-31 NOTE — Progress Notes (Signed)
Assisted Dr. Oddono with left, ultrasound guided, interscalene  block. Side rails up, monitors on throughout procedure. See vital signs in flow sheet. Tolerated Procedure well. 

## 2020-05-31 NOTE — Transfer of Care (Signed)
Immediate Anesthesia Transfer of Care Note  Patient: Adrian [name]  Procedure(s) Performed: LEFT SHOULDER ARTHROSCOPY WITH DEBRIDEMENT, DISTAL CLAVICAL EXCISION,  SUBACROMIAL DECOMPRESSION, BICEPS TENODESIS (Left Shoulder)  Patient Location: PACU  Anesthesia Type:General and Regional  Level of Consciousness: awake, alert  and oriented  Airway & Oxygen Therapy: Patient Spontanous Breathing and Patient connected to nasal cannula oxygen  Post-op Assessment: Report given to RN and Post -op Vital signs reviewed and stable  Post vital signs: Reviewed and stable  Last Vitals:  Vitals Value Taken Time  BP 142/79 05/31/20 1323  Temp    Pulse 74 05/31/20 1325  Resp 22 05/31/20 1325  SpO2 96 % 05/31/20 1325  Vitals shown include unvalidated device data.  Last Pain:  Vitals:   05/31/20 1009  TempSrc: Oral  PainSc: 3       Patients Stated Pain Goal: 4 (05/31/20 1009)  Complications: No complications documented.

## 2020-05-31 NOTE — Anesthesia Postprocedure Evaluation (Signed)
Anesthesia Post Note  Patient: TRYGG MANTZ  Procedure(s) Performed: LEFT SHOULDER ARTHROSCOPY WITH DEBRIDEMENT, DISTAL CLAVICAL EXCISION,  SUBACROMIAL DECOMPRESSION, BICEPS TENODESIS (Left Shoulder)     Patient location during evaluation: PACU Anesthesia Type: General and Regional Level of consciousness: awake and alert Pain management: pain level controlled Vital Signs Assessment: post-procedure vital signs reviewed and stable Respiratory status: spontaneous breathing, nonlabored ventilation, respiratory function stable and patient connected to nasal cannula oxygen Cardiovascular status: blood pressure returned to baseline and stable Postop Assessment: no apparent nausea or vomiting Anesthetic complications: no   No complications documented.  Last Vitals:  Vitals:   05/31/20 1356 05/31/20 1400  BP:  128/69  Pulse: 69 70  Resp: 17 19  Temp:    SpO2: 97% 99%    Last Pain:  Vitals:   05/31/20 1400  TempSrc:   PainSc: 0-No pain                 Nameer Summer COKER

## 2020-05-31 NOTE — Discharge Instructions (Signed)
  Post Anesthesia Home Care Instructions  Activity: Get plenty of rest for the remainder of the day. A responsible individual must stay with you for 24 hours following the procedure.  For the next 24 hours, DO NOT: -Drive a car -Operate machinery -Drink alcoholic beverages -Take any medication unless instructed by your physician -Make any legal decisions or sign important papers.  Meals: Start with liquid foods such as gelatin or soup. Progress to regular foods as tolerated. Avoid greasy, spicy, heavy foods. If nausea and/or vomiting occur, drink only clear liquids until the nausea and/or vomiting subsides. Call your physician if vomiting continues.  Special Instructions/Symptoms: Your throat may feel dry or sore from the anesthesia or the breathing tube placed in your throat during surgery. If this causes discomfort, gargle with warm salt water. The discomfort should disappear within 24 hours.  If you had a scopolamine patch placed behind your ear for the management of post- operative nausea and/or vomiting:  1. The medication in the patch is effective for 72 hours, after which it should be removed.  Wrap patch in a tissue and discard in the trash. Wash hands thoroughly with soap and water. 2. You may remove the patch earlier than 72 hours if you experience unpleasant side effects which may include dry mouth, dizziness or visual disturbances. 3. Avoid touching the patch. Wash your hands with soap and water after contact with the patch.    Information for Discharge Teaching: EXPAREL (bupivacaine liposome injectable suspension)   Your surgeon or anesthesiologist gave you EXPAREL(bupivacaine) to help control your pain after surgery.  EXPAREL is a local anesthetic that provides pain relief by numbing the tissue around the surgical site. EXPAREL is designed to release pain medication over time and can control pain for up to 72 hours. Depending on how you respond to EXPAREL, you may require  less pain medication during your recovery.  Possible side effects: Temporary loss of sensation or ability to move in the area where bupivacaine was injected. Nausea, vomiting, constipation Rarely, numbness and tingling in your mouth or lips, lightheadedness, or anxiety may occur. Call your doctor right away if you think you may be experiencing any of these sensations, or if you have other questions regarding possible side effects.  Follow all other discharge instructions given to you by your surgeon or nurse. Eat a healthy diet and drink plenty of water or other fluids.  If you return to the hospital for any reason within 96 hours following the administration of EXPAREL, it is important for health care providers to know that you have received this anesthetic. A teal colored band has been placed on your arm with the date, time and amount of EXPAREL you have received in order to alert and inform your health care providers. Please leave this armband in place for the full 96 hours following administration, and then you may remove the band.  

## 2020-05-31 NOTE — Anesthesia Procedure Notes (Signed)
Procedure Name: Intubation Performed by: Karen Kitchens, CRNA Pre-anesthesia Checklist: Patient identified, Emergency Drugs available, Suction available and Patient being monitored Patient Re-evaluated:Patient Re-evaluated prior to induction Oxygen Delivery Method: Circle system utilized Preoxygenation: Pre-oxygenation with 100% oxygen Induction Type: IV induction Ventilation: Mask ventilation without difficulty Grade View: Grade I Tube type: Oral Tube size: 7.0 mm Number of attempts: 1 Airway Equipment and Method: Stylet and Oral airway Placement Confirmation: ETT inserted through vocal cords under direct vision,  positive ETCO2,  breath sounds checked- equal and bilateral and CO2 detector Secured at: 23 cm Tube secured with: Tape Dental Injury: Teeth and Oropharynx as per pre-operative assessment

## 2020-06-01 ENCOUNTER — Encounter (HOSPITAL_BASED_OUTPATIENT_CLINIC_OR_DEPARTMENT_OTHER): Payer: Self-pay | Admitting: Orthopaedic Surgery

## 2020-06-07 DIAGNOSIS — M19012 Primary osteoarthritis, left shoulder: Secondary | ICD-10-CM | POA: Diagnosis not present

## 2020-06-27 ENCOUNTER — Encounter: Payer: Self-pay | Admitting: Physical Therapy

## 2020-06-27 ENCOUNTER — Ambulatory Visit (INDEPENDENT_AMBULATORY_CARE_PROVIDER_SITE_OTHER): Payer: BC Managed Care – PPO | Admitting: Physical Therapy

## 2020-06-27 ENCOUNTER — Other Ambulatory Visit: Payer: Self-pay

## 2020-06-27 DIAGNOSIS — R600 Localized edema: Secondary | ICD-10-CM | POA: Diagnosis not present

## 2020-06-27 DIAGNOSIS — M25512 Pain in left shoulder: Secondary | ICD-10-CM | POA: Diagnosis not present

## 2020-06-27 DIAGNOSIS — M6281 Muscle weakness (generalized): Secondary | ICD-10-CM | POA: Diagnosis not present

## 2020-06-27 DIAGNOSIS — M25612 Stiffness of left shoulder, not elsewhere classified: Secondary | ICD-10-CM

## 2020-06-27 NOTE — Patient Instructions (Signed)
Access Code: [email or web address] URL: [email or web address] Date: 06/27/2020 Prepared by: [name]  Exercises Wrist AROM Flexion Extension - 3-4 x daily - 7 x weekly - 10-15 reps Circular Shoulder Pendulum with Table Support - 1 x daily - 7 x weekly - 15 reps - 3 sets Flexion-Extension Shoulder Pendulum with Table Support - 1 x daily - 7 x weekly - 15 reps - 3 sets Horizontal Shoulder Pendulum with Table Support - 1 x daily - 7 x weekly - 15 reps - 3 sets Seated Scapular Retraction - 3-4 x daily - 7 x weekly - 10 reps - 5 sec hold Seated Gripping Towel - 3-4 x daily - 3 sets - 10-15 reps Seated Forearm Pronation and Supination AROM - 3-4 x daily - 10 reps Standing 'L' Stretch at Counter - 3-4 x daily - 10-15 reps

## 2020-06-27 NOTE — Therapy (Signed)
[facility] [place] [address] [place], [place], [address] Phone: [phone]   Fax:  [phone]  Physical Therapy Evaluation  Patient Details  Name: Adrian [name] MRN: [id number] Date of Birth: 04-29-1961 Referring Provider (PT): Dr Ramond Marrow   Encounter Date: 06/27/2020   PT End of Session - 06/27/20 1058    Visit Number 1    Number of Visits 16    Date for PT Re-Evaluation 08/22/20    Authorization Type BCBS    PT Start Time 1058    PT Stop Time 1159    PT Time Calculation (min) 61 min    Activity Tolerance Patient limited by pain    Behavior During Therapy [facility] for tasks assessed/performed           Past Medical History:  Diagnosis Date  . Chronic right hip pain 08/06/2017  . Chronic right-sided low back pain without sciatica 08/06/2017  . GERD (gastroesophageal reflux disease)   . Hypertension   . Hypertriglyceridemia 08/06/2017  . Seasonal allergies     Past Surgical History:  Procedure Laterality Date  . ESOPHAGOGASTRODUODENOSCOPY    . NO PAST SURGERIES    . SHOULDER ARTHROSCOPY WITH SUBACROMIAL DECOMPRESSION AND BICEP TENDON REPAIR Left 05/31/2020   Procedure: LEFT SHOULDER ARTHROSCOPY WITH DEBRIDEMENT, DISTAL CLAVICAL EXCISION,  SUBACROMIAL DECOMPRESSION, BICEPS TENODESIS;  Surgeon: [name], MD;  Location: [facility];  Service: Orthopedics;  Laterality: Left;    There were no vitals filed for this visit.    Subjective Assessment - 06/27/20 1102    Subjective Pt presents for PT with sling, reports he is not wearing it all the time,  does support and protect the arm when he is out of the sling.  His sling was causing neck pain and a heat rash. He has not been using ice recently as he feels like the swelling seems to be gone and he has only dull pain in the shoulder. He needs to have the Rt shoulder repaired next.    Patient Stated Goals use the Lt UE as normal.  HIking , build things, and  shoot a gun and landscaping    Currently in Pain? Yes    Pain Score 1     Pain Location Shoulder    Pain Orientation Left    Pain Descriptors / Indicators Dull    Pain Type Surgical pain    Pain Radiating Towards into medial clavicle and in bicep, tricep and in scapula    Pain Frequency Intermittent              [facility] PT Assessment - 06/27/20 0001      Assessment   Medical Diagnosis Lt RTC repair with bicep tendodesis and DCR    Referring Provider (PT) Dr Ramond Marrow    Onset Date/Surgical Date 05/31/20    Hand Dominance Right    Next MD Visit 07/10/2020    Prior Therapy not for shoulder      Precautions   Precautions Other (comment)    Precaution Comments no resisted elbow flexion or cross body adduction for 6 wks, follow protocol    Required Braces or Orthoses Sling      Balance Screen   Has the patient fallen in the past 6 months Yes    How many times? 1   06/12/2020 slipped on ice   Has the patient had a decrease in activity level because of a fear of falling?  No  Is the patient reluctant to leave their home because of a fear of falling?  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    Leisure hiking, tinkering/building things and shooting guns      Observation/Other Assessments   Focus on Therapeutic Outcomes (FOTO)  45      Posture/Postural Control   Posture/Postural Control Postural limitations    Postural Limitations Forward head;Rounded Shoulders      ROM / Strength   AROM / PROM / Strength AROM;PROM      AROM   AROM Assessment Site Forearm;Wrist;Cervical    Right/Left Forearm Left    Left Forearm Pronation 90 Degrees    Left Forearm Supination 60 Degrees    Right/Left Wrist --   WNL   Cervical Flexion WNL    Cervical Extension WNL    Cervical - Right Rotation 55    Cervical -  Left Rotation 75      PROM   PROM Assessment Site Shoulder    Right/Left Shoulder Left    Left Shoulder Flexion 45 Degrees    Left Shoulder ABduction 45 Degrees    Left Shoulder Internal Rotation --   hand on belly   Left Shoulder External Rotation 40 Degrees      Palpation   Palpation comment tight Lt pecs, biceps and posterior shoulder                      Objective measurements completed on examination: See above findings.       [facility] Adult PT Treatment/Exercise - 06/27/20 0001      Exercises   Exercises Shoulder      Shoulder Exercises: Seated   Retraction Both;10 reps   VC to keep elbows at sides   Other Seated Exercises wrist ROM, forearm supination in supported position, grip, cervical rotation      Shoulder Exercises: Standing   Other Standing Exercises pendulumn      Modalities   Modalities Vasopneumatic      Vasopneumatic   Number Minutes Vasopneumatic  15 minutes    Vasopnuematic Location  Shoulder   lt   Vasopneumatic Pressure Low    Vasopneumatic Temperature  34                  PT Education - 06/27/20 1248    Education Details POC, HEP need for icing    Person(s) Educated Patient    Methods Explanation;Demonstration;Handout    Comprehension Returned demonstration;Verbalized understanding            PT Short Term Goals - 06/27/20 1255      PT SHORT TERM GOAL #1   Title I with initial HEP and understanding of protocol    Time 4    Period Weeks    Status New    Target Date 07/25/20      PT SHORT TERM GOAL #2   Title demo Lt shoulder PROM per protocol    Time 4    Period Weeks    Status New    Target Date 07/25/20      PT SHORT TERM GOAL #3   Title tolerate AAROM Lt shoulder with no more than 4/10 pain    Time 4    Period Weeks    Status  New    Target Date 07/25/20      PT SHORT TERM GOAL #4   Title i with self dressing and grooming    Time 4    Period Weeks    Status New    Target Date 07/25/20              PT Long Term Goals - 06/27/20 1257      PT LONG TERM GOAL #1   Title independent with advanced HEP    Time 8    Period Weeks    Status New    Target Date 08/22/20      PT LONG TERM GOAL #2   Title demo Lt shoulder strength =/> 4/5 to allow initial return to recreational activities    Time 8    Period Weeks    Status New    Target Date 08/22/20      PT LONG TERM GOAL #3   Title improve FOTO =/better than 65    Time 8    Period Weeks    Status New    Target Date 08/22/20      PT LONG TERM GOAL #4   Title demo Lt shoulder and cervical ROM WFL    Time 8    Period Days    Status New    Target Date 08/22/20      PT LONG TERM GOAL #5   Title tolerate some light landscaping with no more than 3/10Lt shoudler pain    Time 8    Period Weeks    Status New    Target Date 08/22/20                  Plan - 06/27/20 1248    Clinical Impression Statement 60 yo male 3.5 wks s/p Lt RTC repair with bicep tenodesis and DCR.  He has a protocol to follow, sling and no resistive bicep or shouder adduction for 6 wks.  He is not wearing sling at home all the time however does support the arm in a neutral position when out of sling.  He is very tight in the muscles of the Lt shoulder and upper arm, still has some bruising and steristrips in place.  PROM of the Lt shoulder is very limited, he has difficulty relaxing and allowing therapist to move the arm.  May do better with self PROM.  Elbow WNL, active elbow supination is tight as is Rt cervical rotation.  Strength not assessed per protocol.  He will benefit from protocol to restore Lt shoulder ROM asssist him through the protocol and prep him for having the Rt shoulder repaired.    Personal Factors and Comorbidities Comorbidity 2;Social Background    Examination-Activity Limitations Bathing;Reach Overhead;Carry;Dressing    Stability/Clinical Decision Making Stable/Uncomplicated    Clinical Decision Making Low    Rehab Potential  Good    PT Frequency 2x / week    PT Duration 8 weeks    PT Treatment/Interventions Iontophoresis 4mg /ml Dexamethasone;Taping;Vasopneumatic Device;Patient/family education;Moist Heat;Therapeutic activities;Passive range of motion;Dry needling;Therapeutic exercise;Cryotherapy;Electrical Stimulation;Neuromuscular re-education;Manual techniques    PT Next Visit Plan PROM Lt shoulder per protocol, initial scapular stability and modalities for pain and swelling    PT Home Exercise Plan [id number]    Consulted and Agree with Plan of Care Patient           Patient will benefit from skilled therapeutic intervention in order to improve the following deficits and impairments:  Decreased range of motion,Impaired UE functional  use,Increased muscle spasms,Pain,Postural dysfunction,Decreased strength  Visit Diagnosis: Stiffness of left shoulder, not elsewhere classified - Plan: PT plan of care cert/re-cert  Acute pain of left shoulder - Plan: PT plan of care cert/re-cert  Localized edema - Plan: PT plan of care cert/re-cert  Muscle weakness (generalized) - Plan: PT plan of care cert/re-cert     Problem List Patient Active Problem List   Diagnosis Date Noted  . 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 Primary osteoarthritis involving multiple joints 07/26/2018  . Acute left ankle pain 07/26/2018  . Cerumen debris on tympanic membrane of right ear 07/26/2018  . Tinea pedis 07/21/2018  . Periscapular pain 07/20/2018  . Retrocalcaneal bursitis, left 07/20/2018  . Hepatitis B core antibody positive 07/20/2018  . Polyarthralgia 07/20/2018  . At risk for obstructive sleep apnea 07/20/2018  . Hearing loss due to cerumen impaction, right 11/02/2017  . Seasonal allergic rhinitis due to pollen 11/02/2017  . Fasting hyperglycemia 08/06/2017  . Primary osteoarthritis of right hip 08/06/2017  . Chronic right-sided low back pain without sciatica 08/06/2017  . Hypertriglyceridemia  08/06/2017  . 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  . Gastroesophageal reflux disease without esophagitis 07/09/2017  . Hypertension goal BP (blood pressure) < 130/80 07/09/2017  . Class 2 obesity due to excess calories in adult 07/09/2017    [name] PT  06/27/2020, 1:03 PM  [facility] [address] [place], [place], [address] Phone: [phone]   Fax:  [phone]  Name: KAZIMIERZ RODI MRN: [id number] Date of Birth: [birth year]

## 2020-07-03 ENCOUNTER — Other Ambulatory Visit: Payer: Self-pay

## 2020-07-03 ENCOUNTER — Ambulatory Visit (INDEPENDENT_AMBULATORY_CARE_PROVIDER_SITE_OTHER): Payer: BC Managed Care – PPO | Admitting: Physical Therapy

## 2020-07-03 DIAGNOSIS — M25612 Stiffness of left shoulder, not elsewhere classified: Secondary | ICD-10-CM

## 2020-07-03 DIAGNOSIS — R600 Localized edema: Secondary | ICD-10-CM | POA: Diagnosis not present

## 2020-07-03 DIAGNOSIS — M6281 Muscle weakness (generalized): Secondary | ICD-10-CM

## 2020-07-03 DIAGNOSIS — M25512 Pain in left shoulder: Secondary | ICD-10-CM | POA: Diagnosis not present

## 2020-07-03 NOTE — Therapy (Signed)
[facility] [place] [address] [place], [place], [address] Phone: [phone]   Fax:  [phone]  Physical Therapy Treatment  Patient Details  Name: Adrian [name] MRN: [id number] Date of Birth: 02/13/1961 Referring Provider (PT): Dr Ramond Marrow   Encounter Date: 07/03/2020   PT End of Session - 07/03/20 1051    Visit Number 2    Number of Visits 16    Date for PT Re-Evaluation 08/22/20    Authorization Type BCBS    PT Start Time 1015    PT Stop Time 1058    PT Time Calculation (min) 43 min    Activity Tolerance Patient limited by pain    Behavior During Therapy [facility] for tasks assessed/performed           Past Medical History:  Diagnosis Date  . Chronic right hip pain 08/06/2017  . Chronic right-sided low back pain without sciatica 08/06/2017  . GERD (gastroesophageal reflux disease)   . Hypertension   . Hypertriglyceridemia 08/06/2017  . Seasonal allergies     Past Surgical History:  Procedure Laterality Date  . ESOPHAGOGASTRODUODENOSCOPY    . NO PAST SURGERIES    . SHOULDER ARTHROSCOPY WITH SUBACROMIAL DECOMPRESSION AND BICEP TENDON REPAIR Left 05/31/2020   Procedure: LEFT SHOULDER ARTHROSCOPY WITH DEBRIDEMENT, DISTAL CLAVICAL EXCISION,  SUBACROMIAL DECOMPRESSION, BICEPS TENODESIS;  Surgeon: [name], MD;  Location: [facility];  Service: Orthopedics;  Laterality: Left;    There were no vitals filed for this visit.   Subjective Assessment - 07/03/20 1018    Subjective Pt states his shoulder is "letting me know it's there"    Patient Stated Goals use the Lt UE as normal.  HIking , build things, and shoot a gun and landscaping    Currently in Pain? Yes    Pain Score 2     Pain Location Shoulder    Pain Orientation Left    Pain Descriptors / Indicators Sore    Pain Type Surgical pain    Pain Onset More than a month ago                             [facility] Adult PT  Treatment/Exercise - 07/03/20 0001      Shoulder Exercises: Seated   Retraction Both;10 reps   keeping elbows at side   Other Seated Exercises wrist ROM x 10 each direction      Shoulder Exercises: Standing   Other Standing Exercises pendulum 1 minute A/P, 1 minute laterally      Vasopneumatic   Number Minutes Vasopneumatic  15 minutes    Vasopnuematic Location  Shoulder    Vasopneumatic Pressure Low    Vasopneumatic Temperature  34      Manual Therapy   Manual Therapy Passive ROM    Passive ROM per protocol into Lt shoulder flexion, ER and abduction                    PT Short Term Goals - 06/27/20 1255      PT SHORT TERM GOAL #1   Title I with initial HEP and understanding of protocol    Time 4    Period Weeks    Status New    Target Date 07/25/20      PT SHORT TERM GOAL #2   Title demo Lt shoulder PROM per protocol    Time 4    Period Weeks  Status New    Target Date 07/25/20      PT SHORT TERM GOAL #3   Title tolerate AAROM Lt shoulder with no more than 4/10 pain    Time 4    Period Weeks    Status New    Target Date 07/25/20      PT SHORT TERM GOAL #4   Title i with self dressing and grooming    Time 4    Period Weeks    Status New    Target Date 07/25/20             PT Long Term Goals - 06/27/20 1257      PT LONG TERM GOAL #1   Title independent with advanced HEP    Time 8    Period Weeks    Status New    Target Date 08/22/20      PT LONG TERM GOAL #2   Title demo Lt shoulder strength =/> 4/5 to allow initial return to recreational activities    Time 8    Period Weeks    Status New    Target Date 08/22/20      PT LONG TERM GOAL #3   Title improve FOTO =/better than 65    Time 8    Period Weeks    Status New    Target Date 08/22/20      PT LONG TERM GOAL #4   Title demo Lt shoulder and cervical ROM WFL    Time 8    Period Days    Status New    Target Date 08/22/20      PT LONG TERM GOAL #5   Title tolerate some  light landscaping with no more than 3/10Lt shoudler pain    Time 8    Period Weeks    Status New    Target Date 08/22/20                 Plan - 07/03/20 1052    Clinical Impression Statement Pt continues with significant mm guarding. Decreased tolerance to PROM. PT educated pt on importance of early PROM to reduce contracture and pain later, pt with good understanding. Pt able to tolerate Lt wrist mobility without pain    PT Next Visit Plan PROM Per protocol    PT Home Exercise Plan [id number]    Consulted and Agree with Plan of Care Patient           Patient will benefit from skilled therapeutic intervention in order to improve the following deficits and impairments:     Visit Diagnosis: Stiffness of left shoulder, not elsewhere classified  Acute pain of left shoulder  Localized edema  Muscle weakness (generalized)     Problem List Patient Active Problem List   Diagnosis Date Noted  . 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 Primary osteoarthritis involving multiple joints 07/26/2018  . Acute left ankle pain 07/26/2018  . Cerumen debris on tympanic membrane of right ear 07/26/2018  . Tinea pedis 07/21/2018  . Periscapular pain 07/20/2018  . Retrocalcaneal bursitis, left 07/20/2018  . Hepatitis B core antibody positive 07/20/2018  . Polyarthralgia 07/20/2018  . At risk for obstructive sleep apnea 07/20/2018  . Hearing loss due to cerumen impaction, right 11/02/2017  . Seasonal allergic rhinitis due to pollen 11/02/2017  . Fasting hyperglycemia 08/06/2017  . Primary osteoarthritis of right hip 08/06/2017  . Chronic right-sided low back pain without sciatica 08/06/2017  . Hypertriglyceridemia 08/06/2017  .  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  . Gastroesophageal reflux disease without esophagitis 07/09/2017  . Hypertension goal BP (blood pressure) < 130/80 07/09/2017  . Class 2 obesity due to excess calories in adult 07/09/2017    [facility], PT  Andra Heslin 07/03/2020, 10:59 AM  [facility] [address] [place], [place], [address] Phone: [phone]   Fax:  [phone]  Name: WINSON EICHORN MRN: [id number] Date of Birth: Dec 20, 1960

## 2020-07-06 ENCOUNTER — Encounter: Payer: BC Managed Care – PPO | Admitting: Physical Therapy

## 2020-07-11 ENCOUNTER — Ambulatory Visit (INDEPENDENT_AMBULATORY_CARE_PROVIDER_SITE_OTHER): Payer: BC Managed Care – PPO | Admitting: Physical Therapy

## 2020-07-11 DIAGNOSIS — R600 Localized edema: Secondary | ICD-10-CM | POA: Diagnosis not present

## 2020-07-11 DIAGNOSIS — M25512 Pain in left shoulder: Secondary | ICD-10-CM | POA: Diagnosis not present

## 2020-07-11 DIAGNOSIS — M25612 Stiffness of left shoulder, not elsewhere classified: Secondary | ICD-10-CM

## 2020-07-11 DIAGNOSIS — M6281 Muscle weakness (generalized): Secondary | ICD-10-CM | POA: Diagnosis not present

## 2020-07-11 NOTE — Therapy (Signed)
[place], [place], [address] Phone: [phone]   Fax:  [phone]  Physical Therapy Treatment  Patient Details  Name: Adrian [name] MRN: [id number] Date of Birth: [birth year] Referring Provider (PT): Dr Ophelia Charter   Encounter Date: 07/11/2020   PT End of Session - 07/11/20 1601    Visit Number 3    Number of Visits 16    Date for PT Re-Evaluation 08/22/20    Authorization Type BCBS    PT Start Time 1601    PT Stop Time 1646    PT Time Calculation (min) 45 min    Activity Tolerance Patient tolerated treatment well    Behavior During Therapy [facility] for tasks assessed/performed           Past Medical History:  Diagnosis Date  . Chronic right hip pain 08/06/2017  . Chronic right-sided low back pain without sciatica 08/06/2017  . GERD (gastroesophageal reflux disease)   . Hypertension   . Hypertriglyceridemia 08/06/2017  . Seasonal allergies     Past Surgical History:  Procedure Laterality Date  . ESOPHAGOGASTRODUODENOSCOPY    . NO PAST SURGERIES    . SHOULDER ARTHROSCOPY WITH SUBACROMIAL DECOMPRESSION AND BICEP TENDON REPAIR Left 05/31/2020   Procedure: LEFT SHOULDER ARTHROSCOPY WITH DEBRIDEMENT, DISTAL CLAVICAL EXCISION,  SUBACROMIAL DECOMPRESSION, BICEPS TENODESIS;  Surgeon: Hiram Gash, MD;  Location: [facility];  Service: Orthopedics;  Laterality: Left;    There were no vitals filed for this visit.   Subjective Assessment - 07/11/20 1656    Subjective Pt reports that his Lt shoulder gets sore (posterior) from driving; driving stresses him.  He reports that one of the incisions in front is tender.  He returns to MD for follow up Friday AM.    Patient Stated Goals use the Lt UE as normal.  HIking , build things, and shoot a gun and landscaping    Currently in Pain? No/denies    Pain Score 0-No pain    Pain Onset More than a month ago              [facility] PT Assessment  - 07/11/20 0001      Assessment   Medical Diagnosis Lt RTC repair with bicep tendodesis and DCR    Referring Provider (PT) Dr Ophelia Charter    Onset Date/Surgical Date 05/31/20    Hand Dominance Right    Next MD Visit 07/10/2020    Prior Therapy not for shoulder      PROM   Right/Left Shoulder Left    Left Shoulder Flexion 90 Degrees    Left Shoulder ABduction 60 Degrees - scaption   Left Shoulder Internal Rotation --   hand on belly   Left Shoulder External Rotation 40 Degrees   supine, supported scaption           [facility] Adult PT Treatment/Exercise - 07/11/20 0001      Elbow Exercises   Elbow Flexion PROM;Left;10 reps      Shoulder Exercises: Standing   Other Standing Exercises pendulum 1 minute CW/CCW, 1 minute A/P and laterally      Vasopneumatic   Number Minutes Vasopneumatic  15 minutes    Vasopnuematic Location  Shoulder   Lt   Vasopneumatic Pressure Low    Vasopneumatic Temperature  34      Manual Therapy   Manual Therapy Scapular mobilization;Passive ROM;Soft tissue mobilization    Soft tissue mobilization STM to periscapular musculature, pec, and  biceps brachii    Scapular Mobilization Lt scapula in all directions    Passive ROM L shoulder within protocol range - flexion, ER/IR, and abdct                    PT Short Term Goals - 06/27/20 1255      PT SHORT TERM GOAL #1   Title I with initial HEP and understanding of protocol    Time 4    Period Weeks    Status New    Target Date 07/25/20      PT SHORT TERM GOAL #2   Title demo Lt shoulder PROM per protocol    Time 4    Period Weeks    Status New    Target Date 07/25/20      PT SHORT TERM GOAL #3   Title tolerate AAROM Lt shoulder with no more than 4/10 pain    Time 4    Period Weeks    Status New    Target Date 07/25/20      PT SHORT TERM GOAL #4   Title i with self dressing and grooming    Time 4    Period Weeks    Status New    Target Date 07/25/20             PT Long Term  Goals - 06/27/20 1257      PT LONG TERM GOAL #1   Title independent with advanced HEP    Time 8    Period Weeks    Status New    Target Date 08/22/20      PT LONG TERM GOAL #2   Title demo Lt shoulder strength =/> 4/5 to allow initial return to recreational activities    Time 8    Period Weeks    Status New    Target Date 08/22/20      PT LONG TERM GOAL #3   Title improve FOTO =/better than 65    Time 8    Period Weeks    Status New    Target Date 08/22/20      PT LONG TERM GOAL #4   Title demo Lt shoulder and cervical ROM WFL    Time 8    Period Days    Status New    Target Date 08/22/20      PT LONG TERM GOAL #5   Title tolerate some light landscaping with no more than 3/10Lt shoudler pain    Time 8    Period Weeks    Status New    Target Date 08/22/20                 Plan - 07/11/20 1646    Clinical Impression Statement Pt guarded with flexion PROM and ER; with increased time and cues, pt was able to relax for improved PROM.  Pt has met 40 deg ER, 60 deg abdct, and 90 deg flexion. All PROM performed for LUE was within tissue limits and no pain. Reviewed and encouraged pt maintain precautions in rehab protocol.  Progressing well towards goals.    Rehab Potential Good    PT Frequency 2x / week    PT Duration 8 weeks    PT Treatment/Interventions Iontophoresis 13m/ml Dexamethasone;Taping;Vasopneumatic Device;Patient/family education;Moist Heat;Therapeutic activities;Passive range of motion;Dry needling;Therapeutic exercise;Cryotherapy;Electrical Stimulation;Neuromuscular re-education;Manual techniques    PT Next Visit Plan Progress to next phase in  protocol - 6 wks.    PT Home Exercise Plan  [id number]    Consulted and Agree with Plan of Care Patient           Patient will benefit from skilled therapeutic intervention in order to improve the following deficits and impairments:  Decreased range of motion,Impaired UE functional use,Increased muscle  spasms,Pain,Postural dysfunction,Decreased strength  Visit Diagnosis: Stiffness of left shoulder, not elsewhere classified  Acute pain of left shoulder  Localized edema  Muscle weakness (generalized)     Problem List Patient Active Problem List   Diagnosis Date Noted  . 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 Primary osteoarthritis involving multiple joints 07/26/2018  . Acute left ankle pain 07/26/2018  . Cerumen debris on tympanic membrane of right ear 07/26/2018  . Tinea pedis 07/21/2018  . Periscapular pain 07/20/2018  . Retrocalcaneal bursitis, left 07/20/2018  . Hepatitis B core antibody positive 07/20/2018  . Polyarthralgia 07/20/2018  . At risk for obstructive sleep apnea 07/20/2018  . Hearing loss due to cerumen impaction, right 11/02/2017  . Seasonal allergic rhinitis due to pollen 11/02/2017  . Fasting hyperglycemia 08/06/2017  . Primary osteoarthritis of right hip 08/06/2017  . Chronic right-sided low back pain without sciatica 08/06/2017  . Hypertriglyceridemia 08/06/2017  . 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  . Gastroesophageal reflux disease without esophagitis 07/09/2017  . Hypertension goal BP (blood pressure) < 130/80 07/09/2017  . Class 2 obesity due to excess calories in adult 07/09/2017   Kerin Perna, PTA 07/11/20 4:58 PM  [place], [place], [address] Phone: [phone]   Fax:  [phone]  Name: Adrian [name] MRN: [id number] Date of Birth: July 05, 1960

## 2020-07-13 ENCOUNTER — Encounter: Payer: Self-pay | Admitting: Rehabilitative and Restorative Service Providers"

## 2020-07-13 ENCOUNTER — Ambulatory Visit: Payer: BC Managed Care – PPO | Admitting: Rehabilitative and Restorative Service Providers"

## 2020-07-13 ENCOUNTER — Other Ambulatory Visit: Payer: Self-pay

## 2020-07-13 DIAGNOSIS — M25612 Stiffness of left shoulder, not elsewhere classified: Secondary | ICD-10-CM

## 2020-07-13 DIAGNOSIS — M19012 Primary osteoarthritis, left shoulder: Secondary | ICD-10-CM | POA: Diagnosis not present

## 2020-07-13 DIAGNOSIS — M6281 Muscle weakness (generalized): Secondary | ICD-10-CM | POA: Diagnosis not present

## 2020-07-13 DIAGNOSIS — M25512 Pain in left shoulder: Secondary | ICD-10-CM | POA: Diagnosis not present

## 2020-07-13 DIAGNOSIS — R600 Localized edema: Secondary | ICD-10-CM

## 2020-07-13 NOTE — Patient Instructions (Signed)
Access Code: [email or web address]: [email or web address]: 02/18/2022Prepared by: Alacia Rehmann HoltExercises  Wrist AROM Flexion Extension - 3-4 x daily - 7 x weekly - 10-15 reps  Circular Shoulder Pendulum with Table Support - 1 x daily - 7 x weekly - 15 reps - 3 sets  Flexion-Extension Shoulder Pendulum with Table Support - 1 x daily - 7 x weekly - 15 reps - 3 sets  Horizontal Shoulder Pendulum with Table Support - 1 x daily - 7 x weekly - 15 reps - 3 sets  Seated Scapular Retraction - 3-4 x daily - 7 x weekly - 10 reps - 5 sec hold  Seated Gripping Towel - 3-4 x daily - 3 sets - 10-15 reps  Seated Forearm Pronation and Supination AROM - 3-4 x daily - 10 reps  Standing 'L' Stretch at Counter - 3-4 x daily - 10-15 reps  Seated Shoulder External Rotation AAROM with Cane and Hand in Neutral - 2 x daily - 7 x weekly - 1 sets - 5-10 reps - 5-10 sec hold  Seated Shoulder Flexion AAROM with Pulley Behind - 2 x daily - 7 x weekly - 1 sets - 10 reps - 10 sec hold  Seated Shoulder Scaption AAROM with Pulley at Side - 2 x daily - 7 x weekly - 1 sets - 10 reps - 10sec hold

## 2020-07-13 NOTE — Therapy (Signed)
[facility] [place], [place], [address] Phone: [phone]   Fax:  [phone]  Physical Therapy Treatment  Patient Details  Name: Adrian [name] MRN: [id number] Date of Birth: Sep 25, 1960 Referring Provider (PT): Dr Ophelia Charter   Encounter Date: 07/13/2020   PT End of Session - 07/13/20 1151    Visit Number 4    Number of Visits 16    Date for PT Re-Evaluation 08/22/20    Authorization Type BCBS    PT Start Time 1151    PT Stop Time 1242    PT Time Calculation (min) 51 min    Activity Tolerance Patient tolerated treatment well           Past Medical History:  Diagnosis Date  . Chronic right hip pain 08/06/2017  . Chronic right-sided low back pain without sciatica 08/06/2017  . GERD (gastroesophageal reflux disease)   . Hypertension   . Hypertriglyceridemia 08/06/2017  . Seasonal allergies     Past Surgical History:  Procedure Laterality Date  . ESOPHAGOGASTRODUODENOSCOPY    . NO PAST SURGERIES    . SHOULDER ARTHROSCOPY WITH SUBACROMIAL DECOMPRESSION AND BICEP TENDON REPAIR Left 05/31/2020   Procedure: LEFT SHOULDER ARTHROSCOPY WITH DEBRIDEMENT, DISTAL CLAVICAL EXCISION,  SUBACROMIAL DECOMPRESSION, BICEPS TENODESIS;  Surgeon: Hiram Gash, MD;  Location: [facility];  Service: Orthopedics;  Laterality: Left;    There were no vitals filed for this visit.   Subjective Assessment - 07/13/20 1151    Subjective Patient saw MD this am - pleased with progress and allowed pt to discontinue sling and begin the next phase of therapy. Reached to use the ATM this am and noted some pain    Currently in Pain? No/denies    Pain Score 0-No pain                             [facility] Adult PT Treatment/Exercise - 07/13/20 0001      Therapeutic Activites    Therapeutic Activities Other Therapeutic Activities    Other Therapeutic Activities myofacila ball release work Lt shoulder  girdle      Shoulder Exercises: Standing   Other Standing Exercises pendulum 1 minute CW/CCW, 1 minute A/P and laterally    Other Standing Exercises scap squeeze 5 sec hold x 10 noodle along spine      Shoulder Exercises: Pulleys   Flexion --   5 reps 10 sec hold   Scaption --   3 reps x 10 sec hold     Shoulder Exercises: ROM/Strengthening   Pendulum 20-30 CW/CCW circles      Shoulder Exercises: Stretch   External Rotation Stretch 5 reps;10 seconds   standing with noodle along spine using cane   Table Stretch - Flexion 3 reps;20 seconds      Vasopneumatic   Number Minutes Vasopneumatic  10 minutes    Vasopnuematic Location  Shoulder   Lt   Vasopneumatic Pressure Low    Vasopneumatic Temperature  34      Manual Therapy   Manual therapy comments pt supine    Soft tissue mobilization STM to periscapular musculature, pec, and biceps brachii    Scapular Mobilization Lt scapula in all directions    Passive ROM Lt shoulder flexion; scaption; ER to pt tolerance within protocol                  PT Education - 07/13/20 1212  Education Details HEP ball release    Person(s) Educated Patient    Methods Explanation;Demonstration;Tactile cues;Verbal cues;Handout    Comprehension Verbalized understanding;Returned demonstration;Verbal cues required;Tactile cues required            PT Short Term Goals - 06/27/20 1255      PT SHORT TERM GOAL #1   Title I with initial HEP and understanding of protocol    Time 4    Period Weeks    Status New    Target Date 07/25/20      PT SHORT TERM GOAL #2   Title demo Lt shoulder PROM per protocol    Time 4    Period Weeks    Status New    Target Date 07/25/20      PT SHORT TERM GOAL #3   Title tolerate AAROM Lt shoulder with no more than 4/10 pain    Time 4    Period Weeks    Status New    Target Date 07/25/20      PT SHORT TERM GOAL #4   Title i with self dressing and grooming    Time 4    Period Weeks    Status New     Target Date 07/25/20             PT Long Term Goals - 06/27/20 1257      PT LONG TERM GOAL #1   Title independent with advanced HEP    Time 8    Period Weeks    Status New    Target Date 08/22/20      PT LONG TERM GOAL #2   Title demo Lt shoulder strength =/> 4/5 to allow initial return to recreational activities    Time 8    Period Weeks    Status New    Target Date 08/22/20      PT LONG TERM GOAL #3   Title improve FOTO =/better than 65    Time 8    Period Weeks    Status New    Target Date 08/22/20      PT LONG TERM GOAL #4   Title demo Lt shoulder and cervical ROM WFL    Time 8    Period Days    Status New    Target Date 08/22/20      PT LONG TERM GOAL #5   Title tolerate some light landscaping with no more than 3/10Lt shoudler pain    Time 8    Period Weeks    Status New    Target Date 08/22/20                 Plan - 07/13/20 1200    Clinical Impression Statement Continued gaurding with exercise and PROM. Note tightness through the Lt shoulder girdle musculature. Good response to manual work with gains in PROM. Flexion to ~ 110 deg. Added pulley and AAROM in ER with cane for home program    Rehab Potential Good    PT Frequency 2x / week    PT Duration 8 weeks    PT Treatment/Interventions Iontophoresis 71m/ml Dexamethasone;Taping;Vasopneumatic Device;Patient/family education;Moist Heat;Therapeutic activities;Passive range of motion;Dry needling;Therapeutic exercise;Cryotherapy;Electrical Stimulation;Neuromuscular re-education;Manual techniques    PT Next Visit Plan progress with AAROM per protocol; continue with manual work to decrease muscular tightness and improve mobiliity; modalities as indicated    PT Home Exercise Plan [email or web address]    Consulted and Agree with Plan of Care Patient  Patient will benefit from skilled therapeutic intervention in order to improve the following deficits and impairments:     Visit Diagnosis: Stiffness of  left shoulder, not elsewhere classified  Acute pain of left shoulder  Localized edema  Muscle weakness (generalized)     Problem List Patient Active Problem List   Diagnosis Date Noted  . 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 Primary osteoarthritis involving multiple joints 07/26/2018  . Acute left ankle pain 07/26/2018  . Cerumen debris on tympanic membrane of right ear 07/26/2018  . Tinea pedis 07/21/2018  . Periscapular pain 07/20/2018  . Retrocalcaneal bursitis, left 07/20/2018  . Hepatitis B core antibody positive 07/20/2018  . Polyarthralgia 07/20/2018  . At risk for obstructive sleep apnea 07/20/2018  . Hearing loss due to cerumen impaction, right 11/02/2017  . Seasonal allergic rhinitis due to pollen 11/02/2017  . Fasting hyperglycemia 08/06/2017  . Primary osteoarthritis of right hip 08/06/2017  . Chronic right-sided low back pain without sciatica 08/06/2017  . Hypertriglyceridemia 08/06/2017  . 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  . Gastroesophageal reflux disease without esophagitis 07/09/2017  . Hypertension goal BP (blood pressure) < 130/80 07/09/2017  . Class 2 obesity due to excess calories in adult 07/09/2017    Taytum Wheller Nilda Simmer  PT, MPH  07/13/2020, 12:55 PM  [facility] [place], [place], [address] Phone: [phone]   Fax:  [phone]  Name: Adrian [name] MRN: [id number] Date of Birth: 1960/10/30

## 2020-07-19 ENCOUNTER — Encounter: Payer: Self-pay | Admitting: Rehabilitative and Restorative Service Providers"

## 2020-07-19 ENCOUNTER — Other Ambulatory Visit: Payer: Self-pay

## 2020-07-19 ENCOUNTER — Ambulatory Visit (INDEPENDENT_AMBULATORY_CARE_PROVIDER_SITE_OTHER): Payer: BC Managed Care – PPO | Admitting: Rehabilitative and Restorative Service Providers"

## 2020-07-19 DIAGNOSIS — M6281 Muscle weakness (generalized): Secondary | ICD-10-CM | POA: Diagnosis not present

## 2020-07-19 DIAGNOSIS — M25612 Stiffness of left shoulder, not elsewhere classified: Secondary | ICD-10-CM

## 2020-07-19 DIAGNOSIS — M25512 Pain in left shoulder: Secondary | ICD-10-CM

## 2020-07-19 DIAGNOSIS — R600 Localized edema: Secondary | ICD-10-CM | POA: Diagnosis not present

## 2020-07-19 NOTE — Therapy (Signed)
[facility] [address] [place], [place], [address] Phone: [phone]   Fax:  [phone]  Physical Therapy Treatment  Patient Details  Name: Adrian [name] MRN: [id number] Date of Birth: December 29, 1960 Referring Provider (PT): Dr Ramond Marrow   Encounter Date: 07/19/2020   PT End of Session - 07/19/20 1017    Visit Number 5    Number of Visits 16    Date for PT Re-Evaluation 08/22/20    Authorization Type BCBS    PT Start Time 1016    PT Stop Time 1110    PT Time Calculation (min) 54 min    Activity Tolerance Patient tolerated treatment well           Past Medical History:  Diagnosis Date  . Chronic right hip pain 08/06/2017  . Chronic right-sided low back pain without sciatica 08/06/2017  . GERD (gastroesophageal reflux disease)   . Hypertension   . Hypertriglyceridemia 08/06/2017  . Seasonal allergies     Past Surgical History:  Procedure Laterality Date  . ESOPHAGOGASTRODUODENOSCOPY    . NO PAST SURGERIES    . SHOULDER ARTHROSCOPY WITH SUBACROMIAL DECOMPRESSION AND BICEP TENDON REPAIR Left 05/31/2020   Procedure: LEFT SHOULDER ARTHROSCOPY WITH DEBRIDEMENT, DISTAL CLAVICAL EXCISION,  SUBACROMIAL DECOMPRESSION, BICEPS TENODESIS;  Surgeon: [name], MD;  Location: [facility];  Service: Orthopedics;  Laterality: Left;    There were no vitals filed for this visit.   Subjective Assessment - 07/19/20 1020    Subjective Patient reports that he has been working on exercises at home. Still using the Lt arm to reach out the window.    Currently in Pain? No/denies    Pain Score 0-No pain              [facility] PT Assessment - 07/19/20 0001      Assessment   Medical Diagnosis Lt RTC repair with bicep tendodesis and DCR    Referring Provider (PT) Dr Ramond Marrow    Onset Date/Surgical Date 05/31/20    Hand Dominance Right    Next MD Visit 07/10/2020    Prior Therapy not for shoulder      PROM    Left Shoulder Flexion 122 Degrees    Left Shoulder ABduction 115 Degrees    Left Shoulder External Rotation 55 Degrees   supine, supported scaption                        [facility] Adult PT Treatment/Exercise - 07/19/20 0001      Shoulder Exercises: Standing   Retraction Limitations scapular depression w/foam roll reaching for floor 5 sec x 10 reps    Other Standing Exercises pendulum 1 minute CW/CCW, 1 minute A/P and laterally    Other Standing Exercises scap squeeze 5 sec hold x 10 noodle along spine      Shoulder Exercises: Pulleys   Flexion --   8 reps 10 sec hold   Scaption --   5 reps x 10 sec hold     Shoulder Exercises: Stretch   External Rotation Stretch 5 reps;10 seconds   standing with noodle along spine using cane   Table Stretch - Flexion 3 reps;20 seconds    Other Shoulder Stretches AAROM Lt shoulder flexion in supine x 10 reps      Vasopneumatic   Number Minutes Vasopneumatic  10 minutes    Vasopnuematic Location  Shoulder   Lt   Vasopneumatic Pressure Low  Vasopneumatic Temperature  34      Manual Therapy   Manual therapy comments pt supine    Soft tissue mobilization STM to periscapular musculature, pec, and biceps brachii    Scapular Mobilization Lt scapula in all directions    Passive ROM Lt shoulder flexion; scaption; ER to pt tolerance within protocol                  PT Education - 07/19/20 1041    Education Details HEP    Person(s) Educated Patient    Methods Explanation;Demonstration;Tactile cues;Verbal cues;Handout    Comprehension Verbalized understanding;Returned demonstration;Verbal cues required;Tactile cues required            PT Short Term Goals - 06/27/20 1255      PT SHORT TERM GOAL #1   Title I with initial HEP and understanding of protocol    Time 4    Period Weeks    Status New    Target Date 07/25/20      PT SHORT TERM GOAL #2   Title demo Lt shoulder PROM per protocol    Time 4    Period Weeks     Status New    Target Date 07/25/20      PT SHORT TERM GOAL #3   Title tolerate AAROM Lt shoulder with no more than 4/10 pain    Time 4    Period Weeks    Status New    Target Date 07/25/20      PT SHORT TERM GOAL #4   Title i with self dressing and grooming    Time 4    Period Weeks    Status New    Target Date 07/25/20             PT Long Term Goals - 06/27/20 1257      PT LONG TERM GOAL #1   Title independent with advanced HEP    Time 8    Period Weeks    Status New    Target Date 08/22/20      PT LONG TERM GOAL #2   Title demo Lt shoulder strength =/> 4/5 to allow initial return to recreational activities    Time 8    Period Weeks    Status New    Target Date 08/22/20      PT LONG TERM GOAL #3   Title improve FOTO =/better than 65    Time 8    Period Weeks    Status New    Target Date 08/22/20      PT LONG TERM GOAL #4   Title demo Lt shoulder and cervical ROM WFL    Time 8    Period Days    Status New    Target Date 08/22/20      PT LONG TERM GOAL #5   Title tolerate some light landscaping with no more than 3/10Lt shoudler pain    Time 8    Period Weeks    Status New    Target Date 08/22/20                 Plan - 07/19/20 1037    Clinical Impression Statement Doing ok at home. Working on exercises at home - using Lt UE for some activities around home. Has had some pain with certain activities. Added supine AAROM shoulder flexion. Continued to work on [employer] work by [employer] Good    PT Frequency 2x /  week    PT Duration 8 weeks    PT Treatment/Interventions Iontophoresis 4mg /ml Dexamethasone;Taping;Vasopneumatic Device;Patient/family education;Moist Heat;Therapeutic activities;Passive range of motion;Dry needling;Therapeutic exercise;Cryotherapy;Electrical Stimulation;Neuromuscular re-education;Manual techniques    PT Next Visit Plan progress with AAROM per protocol; continue with manual work to decrease muscular tightness  and improve mobiliity; modalities as indicated    PT Home Exercise Plan [email or web address]    Consulted and Agree with Plan of Care Patient           Patient will benefit from skilled therapeutic intervention in order to improve the following deficits and impairments:     Visit Diagnosis: Stiffness of left shoulder, not elsewhere classified  Acute pain of left shoulder  Localized edema  Muscle weakness (generalized)     Problem List Patient Active Problem List   Diagnosis Date Noted  . 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 Primary osteoarthritis involving multiple joints 07/26/2018  . Acute left ankle pain 07/26/2018  . Cerumen debris on tympanic membrane of right ear 07/26/2018  . Tinea pedis 07/21/2018  . Periscapular pain 07/20/2018  . Retrocalcaneal bursitis, left 07/20/2018  . Hepatitis B core antibody positive 07/20/2018  . Polyarthralgia 07/20/2018  . At risk for obstructive sleep apnea 07/20/2018  . Hearing loss due to cerumen impaction, right 11/02/2017  . Seasonal allergic rhinitis due to pollen 11/02/2017  . Fasting hyperglycemia 08/06/2017  . Primary osteoarthritis of right hip 08/06/2017  . Chronic right-sided low back pain without sciatica 08/06/2017  . Hypertriglyceridemia 08/06/2017  . 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  . Gastroesophageal reflux disease without esophagitis 07/09/2017  . Hypertension goal BP (blood pressure) < 130/80 07/09/2017  . Class 2 obesity due to excess calories in adult 07/09/2017    Zaidin Blyden 07/11/2017 PT, MPH  07/19/2020, 11:02 AM  [facility] [address] [place], [place], [place] Phone: [phone]   Fax:  [phone]  Name: Adrian [name] MRN: Frankey Poot Date of Birth: [birth year]

## 2020-07-19 NOTE — Patient Instructions (Signed)
Access Code: [email or web address]: [email or web address]: 02/24/2022Prepared by: Darrion Macaulay HoltExercises  Wrist AROM Flexion Extension - 3-4 x daily - 7 x weekly - 10-15 reps  Circular Shoulder Pendulum with Table Support - 1 x daily - 7 x weekly - 15 reps - 3 sets  Flexion-Extension Shoulder Pendulum with Table Support - 1 x daily - 7 x weekly - 15 reps - 3 sets  Horizontal Shoulder Pendulum with Table Support - 1 x daily - 7 x weekly - 15 reps - 3 sets  Seated Scapular Retraction - 3-4 x daily - 7 x weekly - 10 reps - 5 sec hold  Seated Gripping Towel - 3-4 x daily - 3 sets - 10-15 reps  Seated Forearm Pronation and Supination AROM - 3-4 x daily - 10 reps  Standing 'L' Stretch at Counter - 3-4 x daily - 10-15 reps  Seated Shoulder External Rotation AAROM with Cane and Hand in Neutral - 2 x daily - 7 x weekly - 1 sets - 5-10 reps - 5-10 sec hold  Seated Shoulder Flexion AAROM with Pulley Behind - 2 x daily - 7 x weekly - 1 sets - 10 reps - 10 sec hold  Seated Shoulder Scaption AAROM with Pulley at Side - 2 x daily - 7 x weekly - 1 sets - 10 reps - 10sec hold  Supine Shoulder Flexion Extension AAROM with Dowel - 2 x daily - 7 x weekly - 1 sets - 10 reps - 3-5sec hold  Seated Shoulder Flexion Slide at Table Top with Forearm in Neutral - 2 x daily - 7 x weekly - 1 sets - 10 reps - 5-10 sec hold

## 2020-07-24 ENCOUNTER — Encounter: Payer: Self-pay | Admitting: Rehabilitative and Restorative Service Providers"

## 2020-07-24 ENCOUNTER — Other Ambulatory Visit: Payer: Self-pay

## 2020-07-24 ENCOUNTER — Ambulatory Visit: Payer: BC Managed Care – PPO | Admitting: Rehabilitative and Restorative Service Providers"

## 2020-07-24 DIAGNOSIS — M25612 Stiffness of left shoulder, not elsewhere classified: Secondary | ICD-10-CM

## 2020-07-24 DIAGNOSIS — M6281 Muscle weakness (generalized): Secondary | ICD-10-CM | POA: Diagnosis not present

## 2020-07-24 DIAGNOSIS — R600 Localized edema: Secondary | ICD-10-CM | POA: Diagnosis not present

## 2020-07-24 DIAGNOSIS — M25512 Pain in left shoulder: Secondary | ICD-10-CM

## 2020-07-24 NOTE — Therapy (Signed)
[facility] [place] [address] [place] [address] [place], [place], [address] Phone: [phone]   Fax:  [phone]  Physical Therapy Treatment  Patient Details  Name: Adrian [name] MRN: [id number] Date of Birth: Nov 18, 1960 Referring Provider (PT): Dr Ramond Marrow   Encounter Date: 07/24/2020   PT End of Session - 07/24/20 1056    Visit Number 6    Number of Visits 16    Date for PT Re-Evaluation 08/22/20    Authorization Type BCBS    PT Start Time 1055    PT Stop Time 1148    PT Time Calculation (min) 53 min    Activity Tolerance Patient tolerated treatment well           Past Medical History:  Diagnosis Date  . Chronic right hip pain 08/06/2017  . Chronic right-sided low back pain without sciatica 08/06/2017  . GERD (gastroesophageal reflux disease)   . Hypertension   . Hypertriglyceridemia 08/06/2017  . Seasonal allergies     Past Surgical History:  Procedure Laterality Date  . ESOPHAGOGASTRODUODENOSCOPY    . NO PAST SURGERIES    . SHOULDER ARTHROSCOPY WITH SUBACROMIAL DECOMPRESSION AND BICEP TENDON REPAIR Left 05/31/2020   Procedure: LEFT SHOULDER ARTHROSCOPY WITH DEBRIDEMENT, DISTAL CLAVICAL EXCISION,  SUBACROMIAL DECOMPRESSION, BICEPS TENODESIS;  Surgeon: [name], MD;  Location: [facility];  Service: Orthopedics;  Laterality: Left;    There were no vitals filed for this visit.   Subjective Assessment - 07/24/20 1056    Subjective Patient reports that he lifted dog from the vets and carried dog into the house. He had some soreness and pain but not terrible.    Currently in Pain? No/denies    Pain Score 0-No pain    Pain Location Shoulder                             [facility] Adult PT Treatment/Exercise - 07/24/20 0001      Shoulder Exercises: Supine   Other Supine Exercises rhythmic stabilization small ranges flex/ext; horiz ab/ad; circles CW/CCW x 10 reps x 2 sets    Other Supine Exercises  scap squeeze 10 sec x 10      Shoulder Exercises: Standing   Retraction Limitations scapular depression w/foam roll reaching for floor 5 sec x 10 reps    Other Standing Exercises scap squeeze 5 sec hold x 10 noodle along spine      Shoulder Exercises: Pulleys   Flexion --   8 reps 10 sec hold   Scaption --   5 reps x 10 sec hold     Shoulder Exercises: Stretch   External Rotation Stretch 5 reps;10 seconds   standing with noodle along spine using cane   Other Shoulder Stretches AAROM Lt shoulder flexion in supine x 10 reps at counter      Vasopneumatic   Number Minutes Vasopneumatic  15 minutes    Vasopnuematic Location  Shoulder   Lt   Vasopneumatic Pressure Low    Vasopneumatic Temperature  34      Manual Therapy   Manual therapy comments pt supine    Soft tissue mobilization STM to periscapular musculature, pec, and biceps brachii    Scapular Mobilization Lt scapula in all directions    Passive ROM Lt shoulder flexion; scaption; ER; gentle extension and horizontal abduction to pt tolerance within protocol  PT Education - 07/24/20 1124    Education Details HEP    Person(s) Educated Patient    Methods Explanation;Demonstration;Tactile cues;Verbal cues;Handout    Comprehension Verbalized understanding;Returned demonstration;Verbal cues required;Tactile cues required            PT Short Term Goals - 06/27/20 1255      PT SHORT TERM GOAL #1   Title I with initial HEP and understanding of protocol    Time 4    Period Weeks    Status New    Target Date 07/25/20      PT SHORT TERM GOAL #2   Title demo Lt shoulder PROM per protocol    Time 4    Period Weeks    Status New    Target Date 07/25/20      PT SHORT TERM GOAL #3   Title tolerate AAROM Lt shoulder with no more than 4/10 pain    Time 4    Period Weeks    Status New    Target Date 07/25/20      PT SHORT TERM GOAL #4   Title i with self dressing and grooming    Time 4    Period  Weeks    Status New    Target Date 07/25/20             PT Long Term Goals - 06/27/20 1257      PT LONG TERM GOAL #1   Title independent with advanced HEP    Time 8    Period Weeks    Status New    Target Date 08/22/20      PT LONG TERM GOAL #2   Title demo Lt shoulder strength =/> 4/5 to allow initial return to recreational activities    Time 8    Period Weeks    Status New    Target Date 08/22/20      PT LONG TERM GOAL #3   Title improve FOTO =/better than 65    Time 8    Period Weeks    Status New    Target Date 08/22/20      PT LONG TERM GOAL #4   Title demo Lt shoulder and cervical ROM WFL    Time 8    Period Days    Status New    Target Date 08/22/20      PT LONG TERM GOAL #5   Title tolerate some light landscaping with no more than 3/10Lt shoudler pain    Time 8    Period Weeks    Status New    Target Date 08/22/20                 Plan - 07/24/20 1103    Clinical Impression Statement Patient continues to use Lt UE at times for lifting or holding objects. Continues to progress in rehab with increasing ROM and progressing exercises.    Rehab Potential Good    PT Frequency 2x / week    PT Duration 8 weeks    PT Treatment/Interventions Iontophoresis 4mg /ml Dexamethasone;Taping;Vasopneumatic Device;Patient/family education;Moist Heat;Therapeutic activities;Passive range of motion;Dry needling;Therapeutic exercise;Cryotherapy;Electrical Stimulation;Neuromuscular re-education;Manual techniques    PT Next Visit Plan progress with AAROM per protocol; continue with manual work to decrease muscular tightness and improve mobiliity; modalities as indicated - check rhythmic stabilization and add isometric shoulder exercises    PT Home Exercise Plan [email or web address]    Consulted and Agree with Plan of Care Patient  Patient will benefit from skilled therapeutic intervention in order to improve the following deficits and impairments:     Visit  Diagnosis: Stiffness of left shoulder, not elsewhere classified  Acute pain of left shoulder  Localized edema  Muscle weakness (generalized)     Problem List Patient Active Problem List   Diagnosis Date Noted  . 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 Primary osteoarthritis involving multiple joints 07/26/2018  . Acute left ankle pain 07/26/2018  . Cerumen debris on tympanic membrane of right ear 07/26/2018  . Tinea pedis 07/21/2018  . Periscapular pain 07/20/2018  . Retrocalcaneal bursitis, left 07/20/2018  . Hepatitis B core antibody positive 07/20/2018  . Polyarthralgia 07/20/2018  . At risk for obstructive sleep apnea 07/20/2018  . Hearing loss due to cerumen impaction, right 11/02/2017  . Seasonal allergic rhinitis due to pollen 11/02/2017  . Fasting hyperglycemia 08/06/2017  . Primary osteoarthritis of right hip 08/06/2017  . Chronic right-sided low back pain without sciatica 08/06/2017  . Hypertriglyceridemia 08/06/2017  . 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  . Gastroesophageal reflux disease without esophagitis 07/09/2017  . Hypertension goal BP (blood pressure) < 130/80 07/09/2017  . Class 2 obesity due to excess calories in adult 07/09/2017    Ballard Budney Rober Minion PT, MPH  07/24/2020, 11:48 AM  [facility] [address] [place] [address] [place], [place], [address] Phone: [phone]   Fax:  [phone]  Name: Adrian [name] MRN: [id number] Date of Birth: 19-Sep-1960

## 2020-07-24 NOTE — Patient Instructions (Signed)
Access Code: [email or web address]: [email or web address]: 03/01/2022Prepared by: Surafel Hilleary HoltExercises  Wrist AROM Flexion Extension - 3-4 x daily - 7 x weekly - 10-15 reps  Circular Shoulder Pendulum with Table Support - 1 x daily - 7 x weekly - 15 reps - 3 sets  Flexion-Extension Shoulder Pendulum with Table Support - 1 x daily - 7 x weekly - 15 reps - 3 sets  Horizontal Shoulder Pendulum with Table Support - 1 x daily - 7 x weekly - 15 reps - 3 sets  Seated Scapular Retraction - 3-4 x daily - 7 x weekly - 10 reps - 5 sec hold  Seated Gripping Towel - 3-4 x daily - 3 sets - 10-15 reps  Seated Forearm Pronation and Supination AROM - 3-4 x daily - 10 reps  Standing 'L' Stretch at Counter - 3-4 x daily - 10-15 reps  Seated Shoulder External Rotation AAROM with Cane and Hand in Neutral - 2 x daily - 7 x weekly - 1 sets - 5-10 reps - 5-10 sec hold  Seated Shoulder Flexion AAROM with Pulley Behind - 2 x daily - 7 x weekly - 1 sets - 10 reps - 10 sec hold  Seated Shoulder Scaption AAROM with Pulley at Side - 2 x daily - 7 x weekly - 1 sets - 10 reps - 10sec hold  Supine Shoulder Flexion Extension AAROM with Dowel - 2 x daily - 7 x weekly - 1 sets - 10 reps - 3-5sec hold  Seated Shoulder Flexion Slide at Table Top with Forearm in Neutral - 2 x daily - 7 x weekly - 1 sets - 10 reps - 5-10 sec hold  Supine Shoulder Alphabet - 2 x daily - 7 x weekly - 1 sets - 3-5 reps - 5-10 sec hold

## 2020-07-26 ENCOUNTER — Other Ambulatory Visit: Payer: Self-pay

## 2020-07-26 ENCOUNTER — Encounter: Payer: Self-pay | Admitting: Rehabilitative and Restorative Service Providers"

## 2020-07-26 ENCOUNTER — Ambulatory Visit: Payer: BC Managed Care – PPO | Admitting: Rehabilitative and Restorative Service Providers"

## 2020-07-26 DIAGNOSIS — M25512 Pain in left shoulder: Secondary | ICD-10-CM

## 2020-07-26 DIAGNOSIS — M25612 Stiffness of left shoulder, not elsewhere classified: Secondary | ICD-10-CM

## 2020-07-26 DIAGNOSIS — R600 Localized edema: Secondary | ICD-10-CM

## 2020-07-26 DIAGNOSIS — M6281 Muscle weakness (generalized): Secondary | ICD-10-CM

## 2020-07-26 NOTE — Therapy (Signed)
[facility] [place] [address] [place] [address] [place], [place], [address] Phone: [phone]   Fax:  [phone]  Physical Therapy Treatment  Patient Details  Name: Adrian [name] MRN: [id number] Date of Birth: [birth year] Referring Provider (PT): Dr Ramond Marrow   Encounter Date: 07/26/2020   PT End of Session - 07/26/20 1018    Visit Number 7    Number of Visits 16    Date for PT Re-Evaluation 08/22/20    Authorization Type BCBS    PT Start Time 1015    PT Stop Time 1104    PT Time Calculation (min) 49 min           Past Medical History:  Diagnosis Date  . Chronic right hip pain 08/06/2017  . Chronic right-sided low back pain without sciatica 08/06/2017  . GERD (gastroesophageal reflux disease)   . Hypertension   . Hypertriglyceridemia 08/06/2017  . Seasonal allergies     Past Surgical History:  Procedure Laterality Date  . ESOPHAGOGASTRODUODENOSCOPY    . NO PAST SURGERIES    . SHOULDER ARTHROSCOPY WITH SUBACROMIAL DECOMPRESSION AND BICEP TENDON REPAIR Left 05/31/2020   Procedure: LEFT SHOULDER ARTHROSCOPY WITH DEBRIDEMENT, DISTAL CLAVICAL EXCISION,  SUBACROMIAL DECOMPRESSION, BICEPS TENODESIS;  Surgeon: [name], MD;  Location: [facility];  Service: Orthopedics;  Laterality: Left;    There were no vitals filed for this visit.   Subjective Assessment - 07/26/20 1021    Subjective Patient reports that he had some pain/discomfort with supine stabilization exercise and he held that exercise.    Currently in Pain? Yes    Pain Score 2     Pain Location Shoulder    Pain Orientation Left    Pain Descriptors / Indicators Aching;Sore              [facility] PT Assessment - 07/26/20 0001      Assessment   Medical Diagnosis Lt RTC repair with bicep tendodesis and DCR    Referring Provider (PT) Dr Ramond Marrow    Onset Date/Surgical Date 05/31/20    Hand Dominance Right    Next MD Visit 07/10/2020    Prior Therapy not  for shoulder      PROM   Left Shoulder Flexion 128 Degrees    Left Shoulder ABduction 135 Degrees   in scapular plane   Left Shoulder External Rotation 55 Degrees   supine, supported in scapular plane     Palpation   Palpation comment tight Lt pecs, biceps and posterior shoulder                         [facility] Adult PT Treatment/Exercise - 07/26/20 0001      Shoulder Exercises: Standing   Retraction Limitations scapular depression w/foam roll reaching for floor 5 sec x 10 reps    Other Standing Exercises scap squeeze 5 sec hold x 10 noodle along spine      Shoulder Exercises: Pulleys   Flexion --   8 reps 10 sec hold   Scaption --   5 reps x 10 sec hold     Shoulder Exercises: ROM/Strengthening   Pendulum 20-30 CW/CCW circles      Shoulder Exercises: Isometric Strengthening   Extension 5X5"    External Rotation 5X5"    Internal Rotation 5X5"    ABduction 5X5"      Shoulder Exercises: Stretch   External Rotation Stretch 5 reps;10 seconds   standing with  noodle along spine using cane   Other Shoulder Stretches AAROM Lt shoulder flexion in supine x 10 reps at counter      Vasopneumatic   Number Minutes Vasopneumatic  10 minutes    Vasopnuematic Location  Shoulder   Lt   Vasopneumatic Pressure Low    Vasopneumatic Temperature  34      Manual Therapy   Manual therapy comments pt supine    Soft tissue mobilization STM to periscapular musculature, pec, and biceps brachii    Scapular Mobilization Lt scapula in all directions    Passive ROM Lt shoulder flexion; scaption; ER; gentle extension and horizontal abduction to pt tolerance within protocol                  PT Education - 07/26/20 1030    Education Details HEP    Person(s) Educated Patient    Methods Explanation;Demonstration;Tactile cues;Verbal cues;Handout    Comprehension Verbalized understanding;Returned demonstration;Verbal cues required;Tactile cues required            PT Short Term  Goals - 06/27/20 1255      PT SHORT TERM GOAL #1   Title I with initial HEP and understanding of protocol    Time 4    Period Weeks    Status New    Target Date 07/25/20      PT SHORT TERM GOAL #2   Title demo Lt shoulder PROM per protocol    Time 4    Period Weeks    Status New    Target Date 07/25/20      PT SHORT TERM GOAL #3   Title tolerate AAROM Lt shoulder with no more than 4/10 pain    Time 4    Period Weeks    Status New    Target Date 07/25/20      PT SHORT TERM GOAL #4   Title i with self dressing and grooming    Time 4    Period Weeks    Status New    Target Date 07/25/20             PT Long Term Goals - 06/27/20 1257      PT LONG TERM GOAL #1   Title independent with advanced HEP    Time 8    Period Weeks    Status New    Target Date 08/22/20      PT LONG TERM GOAL #2   Title demo Lt shoulder strength =/> 4/5 to allow initial return to recreational activities    Time 8    Period Weeks    Status New    Target Date 08/22/20      PT LONG TERM GOAL #3   Title improve FOTO =/better than 65    Time 8    Period Weeks    Status New    Target Date 08/22/20      PT LONG TERM GOAL #4   Title demo Lt shoulder and cervical ROM WFL    Time 8    Period Days    Status New    Target Date 08/22/20      PT LONG TERM GOAL #5   Title tolerate some light landscaping with no more than 3/10Lt shoudler pain    Time 8    Period Weeks    Status New    Target Date 08/22/20                 Plan - 07/26/20  1024    Clinical Impression Statement Patient experienced some pain with supine stabilization. Will hold until next week He will be away at son's wedding this weekend. Tolerated isometric exercises today without difficulty. Continued with manual work and PROM/stretching.    Rehab Potential Good    PT Frequency 2x / week    PT Duration 8 weeks    PT Treatment/Interventions Iontophoresis 4mg /ml Dexamethasone;Taping;Vasopneumatic  Device;Patient/family education;Moist Heat;Therapeutic activities;Passive range of motion;Dry needling;Therapeutic exercise;Cryotherapy;Electrical Stimulation;Neuromuscular re-education;Manual techniques    PT Next Visit Plan progress with AAROM per protocol; continue with manual work to decrease muscular tightness and improve mobiliity; modalities as indicated - hold rhythmic stabilization until next visit; and check isometric shoulder exercises    PT Home Exercise Plan [email or web address]    Consulted and Agree with Plan of Care Patient           Patient will benefit from skilled therapeutic intervention in order to improve the following deficits and impairments:     Visit Diagnosis: Stiffness of left shoulder, not elsewhere classified  Acute pain of left shoulder  Localized edema  Muscle weakness (generalized)     Problem List Patient Active Problem List   Diagnosis Date Noted  . 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 Primary osteoarthritis involving multiple joints 07/26/2018  . Acute left ankle pain 07/26/2018  . Cerumen debris on tympanic membrane of right ear 07/26/2018  . Tinea pedis 07/21/2018  . Periscapular pain 07/20/2018  . Retrocalcaneal bursitis, left 07/20/2018  . Hepatitis B core antibody positive 07/20/2018  . Polyarthralgia 07/20/2018  . At risk for obstructive sleep apnea 07/20/2018  . Hearing loss due to cerumen impaction, right 11/02/2017  . Seasonal allergic rhinitis due to pollen 11/02/2017  . Fasting hyperglycemia 08/06/2017  . Primary osteoarthritis of right hip 08/06/2017  . Chronic right-sided low back pain without sciatica 08/06/2017  . Hypertriglyceridemia 08/06/2017  . 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  . Gastroesophageal reflux disease without esophagitis 07/09/2017  . Hypertension goal BP (blood pressure) < 130/80 07/09/2017  . Class 2 obesity due to excess calories in adult 07/09/2017    Hildagarde Holleran 07/11/2017 PT, MPH  07/26/2020,  10:52 AM  [facility] [address] [place] [address] [place], [place], [place] Phone: [phone]   Fax:  [phone]  Name: Adrian [name] MRN: Frankey Poot Date of Birth: [birth year]

## 2020-07-26 NOTE — Patient Instructions (Signed)
Access Code: [email or web address]: [email or web address]: 03/03/2022Prepared by: Maansi Wike HoltExercises  Wrist AROM Flexion Extension - 3-4 x daily - 7 x weekly - 10-15 reps  Circular Shoulder Pendulum with Table Support - 1 x daily - 7 x weekly - 15 reps - 3 sets  Flexion-Extension Shoulder Pendulum with Table Support - 1 x daily - 7 x weekly - 15 reps - 3 sets  Horizontal Shoulder Pendulum with Table Support - 1 x daily - 7 x weekly - 15 reps - 3 sets  Seated Scapular Retraction - 3-4 x daily - 7 x weekly - 10 reps - 5 sec hold  Seated Gripping Towel - 3-4 x daily - 3 sets - 10-15 reps  Seated Forearm Pronation and Supination AROM - 3-4 x daily - 10 reps  Standing 'L' Stretch at Counter - 3-4 x daily - 10-15 reps  Seated Shoulder External Rotation AAROM with Cane and Hand in Neutral - 2 x daily - 7 x weekly - 1 sets - 5-10 reps - 5-10 sec hold  Seated Shoulder Flexion AAROM with Pulley Behind - 2 x daily - 7 x weekly - 1 sets - 10 reps - 10 sec hold  Seated Shoulder Scaption AAROM with Pulley at Side - 2 x daily - 7 x weekly - 1 sets - 10 reps - 10sec hold  Supine Shoulder Flexion Extension AAROM with Dowel - 2 x daily - 7 x weekly - 1 sets - 10 reps - 3-5sec hold  Seated Shoulder Flexion Slide at Table Top with Forearm in Neutral - 2 x daily - 7 x weekly - 1 sets - 10 reps - 5-10 sec hold  Supine Shoulder Alphabet - 2 x daily - 7 x weekly - 1 sets - 3-5 reps - 5-10 sec hold  Isometric Shoulder Extension at Wall - 2 x daily - 7 x weekly - 1 sets - 5-10 reps - 5 sec hold  Standing Isometric Shoulder Abduction with Doorway - Arm Bent - 2 x daily - 7 x weekly - 1 sets - 5 reps - 5 sec hold  Standing Isometric Shoulder External Rotation with Doorway - 2 x daily - 7 x weekly - 1 sets - 5-10 reps - 5 sec hold  Standing Isometric Shoulder Internal Rotation with Towel Roll at Doorway - 2 x daily - 7 x weekly - 1 sets - 5 reps - 5 sec hold

## 2020-07-31 ENCOUNTER — Encounter: Payer: Self-pay | Admitting: Rehabilitative and Restorative Service Providers"

## 2020-07-31 ENCOUNTER — Ambulatory Visit (INDEPENDENT_AMBULATORY_CARE_PROVIDER_SITE_OTHER): Payer: BC Managed Care – PPO | Admitting: Rehabilitative and Restorative Service Providers"

## 2020-07-31 ENCOUNTER — Other Ambulatory Visit: Payer: Self-pay

## 2020-07-31 DIAGNOSIS — M25612 Stiffness of left shoulder, not elsewhere classified: Secondary | ICD-10-CM

## 2020-07-31 DIAGNOSIS — R600 Localized edema: Secondary | ICD-10-CM

## 2020-07-31 DIAGNOSIS — M25512 Pain in left shoulder: Secondary | ICD-10-CM

## 2020-07-31 DIAGNOSIS — M6281 Muscle weakness (generalized): Secondary | ICD-10-CM

## 2020-07-31 NOTE — Therapy (Signed)
[facility] [place] [address] [place], [place], [address] Phone: [phone]   Fax:  [phone]  Physical Therapy Treatment  Patient Details  Name: Adrian [name] MRN: [id number] Date of Birth: [birth year] Referring Provider (PT): Dr Ramond Marrow   Encounter Date: 07/31/2020   PT End of Session - 07/31/20 1059    Visit Number 8    Number of Visits 16    Date for PT Re-Evaluation 08/22/20    Authorization Type BCBS    PT Start Time 1058    PT Stop Time 1148    PT Time Calculation (min) 50 min    Activity Tolerance Patient tolerated treatment well           Past Medical History:  Diagnosis Date  . Chronic right hip pain 08/06/2017  . Chronic right-sided low back pain without sciatica 08/06/2017  . GERD (gastroesophageal reflux disease)   . Hypertension   . Hypertriglyceridemia 08/06/2017  . Seasonal allergies     Past Surgical History:  Procedure Laterality Date  . ESOPHAGOGASTRODUODENOSCOPY    . NO PAST SURGERIES    . SHOULDER ARTHROSCOPY WITH SUBACROMIAL DECOMPRESSION AND BICEP TENDON REPAIR Left 05/31/2020   Procedure: LEFT SHOULDER ARTHROSCOPY WITH DEBRIDEMENT, DISTAL CLAVICAL EXCISION,  SUBACROMIAL DECOMPRESSION, BICEPS TENODESIS;  Surgeon: [name], MD;  Location: [facility];  Service: Orthopedics;  Laterality: Left;    There were no vitals filed for this visit.   Subjective Assessment - 07/31/20 1059    Subjective Patient reports that he was on his feet for over three hours when he was helping to decorate for his son's wedding. He was using the UE's for a lot of different stuff. Some soreness and discomfort since then.    Currently in Pain? No/denies    Pain Score 0-No pain    Pain Location Shoulder    Pain Orientation Right              [facility] PT Assessment - 07/31/20 0001      Assessment   Medical Diagnosis Lt RTC repair with bicep tendodesis and DCR    Referring Provider (PT) Dr  Ramond Marrow    Onset Date/Surgical Date 05/31/20    Hand Dominance Right    Next MD Visit 07/10/2020    Prior Therapy not for shoulder      PROM   Left Shoulder Flexion 140 Degrees    Left Shoulder ABduction 138 Degrees   in scapular plane   Left Shoulder External Rotation 58 Degrees   supine, supported in scapular plane     Palpation   Palpation comment tight Lt pecs, biceps and posterior shoulder                         [facility] Adult PT Treatment/Exercise - 07/31/20 0001      Shoulder Exercises: Supine   Other Supine Exercises rhythmic stabilization small ranges flex/ext; horiz ab/ad; circles CW/CCW x 10 reps x 2 sets    Other Supine Exercises chest lift/scap squeeze 10 sec x 10      Shoulder Exercises: Standing   Retraction Limitations scapular depression w/foam roll reaching for floor 5 sec x 5 reps   added gentle lateral cervical flexion w/ shoulder depression  sec hold x 2 on each side   Other Standing Exercises scap squeeze 5 sec hold x 10 noodle along spine      Shoulder Exercises: Pulleys   Flexion --  8 reps 10 sec hold   Scaption --   5 reps x 10 sec hold     Shoulder Exercises: Isometric Strengthening   Extension 5X5"    External Rotation 5X5"    Internal Rotation 5X5"    ABduction 5X5"      Shoulder Exercises: Stretch   External Rotation Stretch 5 reps;10 seconds   standing with noodle along spine using cane   Other Shoulder Stretches AAROM Lt shoulder flexion in supine x 10 reps at counter      Vasopneumatic   Number Minutes Vasopneumatic  10 minutes    Vasopnuematic Location  Shoulder   Lt   Vasopneumatic Pressure Low    Vasopneumatic Temperature  34      Manual Therapy   Manual therapy comments pt supine    Soft tissue mobilization STM to periscapular musculature, pec, and biceps brachii    Scapular Mobilization Lt scapula in all directions    Passive ROM Lt shoulder flexion; scaption; ER; gentle extension and horizontal abduction to pt  tolerance within protocol                    PT Short Term Goals - 06/27/20 1255      PT SHORT TERM GOAL #1   Title I with initial HEP and understanding of protocol    Time 4    Period Weeks    Status New    Target Date 07/25/20      PT SHORT TERM GOAL #2   Title demo Lt shoulder PROM per protocol    Time 4    Period Weeks    Status New    Target Date 07/25/20      PT SHORT TERM GOAL #3   Title tolerate AAROM Lt shoulder with no more than 4/10 pain    Time 4    Period Weeks    Status New    Target Date 07/25/20      PT SHORT TERM GOAL #4   Title i with self dressing and grooming    Time 4    Period Weeks    Status New    Target Date 07/25/20             PT Long Term Goals - 06/27/20 1257      PT LONG TERM GOAL #1   Title independent with advanced HEP    Time 8    Period Weeks    Status New    Target Date 08/22/20      PT LONG TERM GOAL #2   Title demo Lt shoulder strength =/> 4/5 to allow initial return to recreational activities    Time 8    Period Weeks    Status New    Target Date 08/22/20      PT LONG TERM GOAL #3   Title improve FOTO =/better than 65    Time 8    Period Weeks    Status New    Target Date 08/22/20      PT LONG TERM GOAL #4   Title demo Lt shoulder and cervical ROM WFL    Time 8    Period Days    Status New    Target Date 08/22/20      PT LONG TERM GOAL #5   Title tolerate some light landscaping with no more than 3/10Lt shoudler pain    Time 8    Period Weeks    Status New  Target Date 08/22/20                 Plan - 07/31/20 1103    Clinical Impression Statement Patient reports some pain and discomfort due to activities surrounding his son's wedding this past weekend. Tolerated exercises well. Continued gains in PROM in flexion; scaption; ER in scapular plane.    Rehab Potential Good    PT Frequency 2x / week    PT Duration 8 weeks    PT Treatment/Interventions Iontophoresis 4mg /ml  Dexamethasone;Taping;Vasopneumatic Device;Patient/family education;Moist Heat;Therapeutic activities;Passive range of motion;Dry needling;Therapeutic exercise;Cryotherapy;Electrical Stimulation;Neuromuscular re-education;Manual techniques    PT Next Visit Plan progress with exercise per protocol; continue with manual work to decrease muscular tightness and improve mobiliity; modalities as indicated    PT Home Exercise Plan [email or web address]    Consulted and Agree with Plan of Care Patient           Patient will benefit from skilled therapeutic intervention in order to improve the following deficits and impairments:     Visit Diagnosis: Stiffness of left shoulder, not elsewhere classified  Acute pain of left shoulder  Localized edema  Muscle weakness (generalized)     Problem List Patient Active Problem List   Diagnosis Date Noted  . 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 Primary osteoarthritis involving multiple joints 07/26/2018  . Acute left ankle pain 07/26/2018  . Cerumen debris on tympanic membrane of right ear 07/26/2018  . Tinea pedis 07/21/2018  . Periscapular pain 07/20/2018  . Retrocalcaneal bursitis, left 07/20/2018  . Hepatitis B core antibody positive 07/20/2018  . Polyarthralgia 07/20/2018  . At risk for obstructive sleep apnea 07/20/2018  . Hearing loss due to cerumen impaction, right 11/02/2017  . Seasonal allergic rhinitis due to pollen 11/02/2017  . Fasting hyperglycemia 08/06/2017  . Primary osteoarthritis of right hip 08/06/2017  . Chronic right-sided low back pain without sciatica 08/06/2017  . Hypertriglyceridemia 08/06/2017  . 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  . Gastroesophageal reflux disease without esophagitis 07/09/2017  . Hypertension goal BP (blood pressure) < 130/80 07/09/2017  . Class 2 obesity due to excess calories in adult 07/09/2017    Suzanne Garbers 07/11/2017 PT, MPH  07/31/2020, 11:44 AM  [facility] [address] [place], [place], [place] Phone: [phone]   Fax:  [phone]  Name: ZORION NIMS MRN: Frankey Poot Date of Birth: 1960-12-11

## 2020-08-02 ENCOUNTER — Ambulatory Visit: Payer: BC Managed Care – PPO | Admitting: Physical Therapy

## 2020-08-02 ENCOUNTER — Encounter: Payer: Self-pay | Admitting: Physical Therapy

## 2020-08-02 ENCOUNTER — Other Ambulatory Visit: Payer: Self-pay

## 2020-08-02 DIAGNOSIS — R600 Localized edema: Secondary | ICD-10-CM | POA: Diagnosis not present

## 2020-08-02 DIAGNOSIS — M25512 Pain in left shoulder: Secondary | ICD-10-CM

## 2020-08-02 DIAGNOSIS — M25612 Stiffness of left shoulder, not elsewhere classified: Secondary | ICD-10-CM

## 2020-08-02 DIAGNOSIS — M6281 Muscle weakness (generalized): Secondary | ICD-10-CM | POA: Diagnosis not present

## 2020-08-02 NOTE — Therapy (Signed)
[place], [place], [address] Phone: [phone]   Fax:  [phone]  Physical Therapy Treatment  Patient Details  Name: Adrian [name] MRN: [id number] Date of Birth: 1960-07-19 Referring Provider (PT): Dr Ophelia Charter   Encounter Date: 08/02/2020   PT End of Session - 08/02/20 1146    Visit Number 9    Number of Visits 16    Date for PT Re-Evaluation 08/22/20    Authorization Type BCBS    PT Start Time 1146    PT Stop Time 1234    PT Time Calculation (min) 48 min    Activity Tolerance Patient tolerated treatment well    Behavior During Therapy [facility] for tasks assessed/performed           Past Medical History:  Diagnosis Date  . Chronic right hip pain 08/06/2017  . Chronic right-sided low back pain without sciatica 08/06/2017  . GERD (gastroesophageal reflux disease)   . Hypertension   . Hypertriglyceridemia 08/06/2017  . Seasonal allergies     Past Surgical History:  Procedure Laterality Date  . ESOPHAGOGASTRODUODENOSCOPY    . NO PAST SURGERIES    . SHOULDER ARTHROSCOPY WITH SUBACROMIAL DECOMPRESSION AND BICEP TENDON REPAIR Left 05/31/2020   Procedure: LEFT SHOULDER ARTHROSCOPY WITH DEBRIDEMENT, DISTAL CLAVICAL EXCISION,  SUBACROMIAL DECOMPRESSION, BICEPS TENODESIS;  Surgeon: Hiram Gash, MD;  Location: [facility];  Service: Orthopedics;  Laterality: Left;    There were no vitals filed for this visit.   Subjective Assessment - 08/02/20 1147    Subjective Pt reports some bruising in his Lt shoulder he noticed while in the shower; doesn't see it now. He has been using Lt hand for opening microwave and other reaching tasks, "I've been trying to work the soreness out".    Patient Stated Goals use the Lt UE as normal.  HIking , build things, and shoot a gun and landscaping    Currently in Pain? Yes    Pain Score 1     Pain Location Shoulder    Pain Orientation Left    Pain  Descriptors / Indicators Sore              [facility] PT Assessment - 08/02/20 0001      Assessment   Medical Diagnosis Lt RTC repair with bicep tendodesis and DCR    Referring Provider (PT) Dr Ophelia Charter    Onset Date/Surgical Date 05/31/20    Hand Dominance Right    Next MD Visit 08/23/20    Prior Therapy not for shoulder      AROM   AROM Assessment Site Shoulder    Right/Left Shoulder Left    Left Shoulder Extension 45 Degrees    Left Shoulder Flexion 118 Degrees    Left Shoulder ABduction 86 Degrees   pain in Lt tricep          [facility] Adult PT Treatment/Exercise - 08/02/20 0001      Shoulder Exercises: Prone   Other Prone Exercises scap retraction with prone row to neutral x 10 reps,  horiz abdct (65deg of abdct) leading with pinky x 10, cues for lower trap engagement. - for LUE.      Shoulder Exercises: Standing   Internal Rotation AAROM;Left;10 reps   cane   Flexion AROM;Left;5 reps   mirror for visual feedback, to ~85 deg.   ABduction AROM;Left;5 reps   mirror for visual feedback. scaption to ~80 deg   Extension AAROM;Both;10 reps  cane   Other Standing Exercises W's x 5 sec x 5 reps; 3 reps of LUE wall ladder in flexion     Shoulder Exercises: Pulleys   Flexion --   10 reps 10 sec hold   Scaption --   10 reps x 10 sec hold     Shoulder Exercises: Stretch   Other Shoulder Stretches Lt shoulder ext stretch holding counter x 15 sec      Vasopneumatic   Number Minutes Vasopneumatic  10 minutes    Vasopnuematic Location  Shoulder   Lt   Vasopneumatic Pressure Low    Vasopneumatic Temperature  34                    PT Short Term Goals - 08/02/20 1232      PT SHORT TERM GOAL #1   Title I with initial HEP and understanding of protocol    Time 4    Period Weeks    Status Achieved    Target Date 07/25/20      PT SHORT TERM GOAL #2   Title demo Lt shoulder PROM per protocol    Time 4    Period Weeks    Status Achieved    Target Date 07/25/20       PT SHORT TERM GOAL #3   Title tolerate AAROM Lt shoulder with no more than 4/10 pain    Time 4    Period Weeks    Status Achieved    Target Date 07/25/20      PT SHORT TERM GOAL #4   Title i with self dressing and grooming    Time 4    Period Weeks    Status Achieved    Target Date 07/25/20             PT Long Term Goals - 06/27/20 1257      PT LONG TERM GOAL #1   Title independent with advanced HEP    Time 8    Period Weeks    Status New    Target Date 08/22/20      PT LONG TERM GOAL #2   Title demo Lt shoulder strength =/> 4/5 to allow initial return to recreational activities    Time 8    Period Weeks    Status New    Target Date 08/22/20      PT LONG TERM GOAL #3   Title improve FOTO =/better than 65    Time 8    Period Weeks    Status New    Target Date 08/22/20      PT LONG TERM GOAL #4   Title demo Lt shoulder and cervical ROM WFL    Time 8    Period Days    Status New    Target Date 08/22/20      PT LONG TERM GOAL #5   Title tolerate some light landscaping with no more than 3/10Lt shoudler pain    Time 8    Period Weeks    Status New    Target Date 08/22/20                 Plan - 08/02/20 1227    Clinical Impression Statement Pt tolerating AAROM/AROM exercises for Lt shoulder well without pain.  Reviewed HEP and updated to take out some of the early exercises for wrist/ grip/ pendulum. Pt making good progress within rehab protocol. Has met all STG.    Rehab Potential Good  PT Frequency 2x / week    PT Duration 8 weeks    PT Treatment/Interventions Iontophoresis 32m/ml Dexamethasone;Taping;Vasopneumatic Device;Patient/family education;Moist Heat;Therapeutic activities;Passive range of motion;Dry needling;Therapeutic exercise;Cryotherapy;Electrical Stimulation;Neuromuscular re-education;Manual techniques    PT Next Visit Plan progress with exercise per protocol; continue with manual work to decrease muscular tightness and improve  mobiliity; modalities as indicated - 10 wks s/p surg on 08/09/20.    PT Home Exercise Plan [email or web address]    Consulted and Agree with Plan of Care Patient           Patient will benefit from skilled therapeutic intervention in order to improve the following deficits and impairments:  Decreased range of motion,Impaired UE functional use,Increased muscle spasms,Pain,Postural dysfunction,Decreased strength  Visit Diagnosis: Stiffness of left shoulder, not elsewhere classified  Acute pain of left shoulder  Localized edema  Muscle weakness (generalized)     Problem List Patient Active Problem List   Diagnosis Date Noted  . 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 Primary osteoarthritis involving multiple joints 07/26/2018  . Acute left ankle pain 07/26/2018  . Cerumen debris on tympanic membrane of right ear 07/26/2018  . Tinea pedis 07/21/2018  . Periscapular pain 07/20/2018  . Retrocalcaneal bursitis, left 07/20/2018  . Hepatitis B core antibody positive 07/20/2018  . Polyarthralgia 07/20/2018  . At risk for obstructive sleep apnea 07/20/2018  . Hearing loss due to cerumen impaction, right 11/02/2017  . Seasonal allergic rhinitis due to pollen 11/02/2017  . Fasting hyperglycemia 08/06/2017  . Primary osteoarthritis of right hip 08/06/2017  . Chronic right-sided low back pain without sciatica 08/06/2017  . Hypertriglyceridemia 08/06/2017  . 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  . Gastroesophageal reflux disease without esophagitis 07/09/2017  . Hypertension goal BP (blood pressure) < 130/80 07/09/2017  . Class 2 obesity due to excess calories in adult 07/09/2017   JKerin Perna PTA 08/02/20 12:32 PM  [place] [address]Phone: [phone]  Fax:  [phone] Name: Adrian WIRZMRN: [id number] of Birth: [birth year]

## 2020-08-07 ENCOUNTER — Other Ambulatory Visit: Payer: Self-pay

## 2020-08-07 ENCOUNTER — Ambulatory Visit: Payer: BC Managed Care – PPO | Admitting: Physical Therapy

## 2020-08-07 DIAGNOSIS — M25612 Stiffness of left shoulder, not elsewhere classified: Secondary | ICD-10-CM | POA: Diagnosis not present

## 2020-08-07 DIAGNOSIS — R600 Localized edema: Secondary | ICD-10-CM

## 2020-08-07 DIAGNOSIS — M25512 Pain in left shoulder: Secondary | ICD-10-CM

## 2020-08-07 NOTE — Therapy (Signed)
[facility] [place] [address] [place], [place], [address] Phone: [phone]   Fax:  [phone]  Physical Therapy Treatment  Patient Details  Name: Adrian [name] MRN: [id number] Date of Birth: 1960-08-14 Referring Provider (PT): Dr Ramond Marrow   Encounter Date: 08/07/2020   PT End of Session - 08/07/20 1153    Visit Number 10    Number of Visits 16    Date for PT Re-Evaluation 08/22/20    Authorization Type BCBS    PT Start Time 1147    PT Stop Time 1225    PT Time Calculation (min) 38 min    Activity Tolerance Patient tolerated treatment well    Behavior During Therapy [facility] for tasks assessed/performed           Past Medical History:  Diagnosis Date  . Chronic right hip pain 08/06/2017  . Chronic right-sided low back pain without sciatica 08/06/2017  . GERD (gastroesophageal reflux disease)   . Hypertension   . Hypertriglyceridemia 08/06/2017  . Seasonal allergies     Past Surgical History:  Procedure Laterality Date  . ESOPHAGOGASTRODUODENOSCOPY    . NO PAST SURGERIES    . SHOULDER ARTHROSCOPY WITH SUBACROMIAL DECOMPRESSION AND BICEP TENDON REPAIR Left 05/31/2020   Procedure: LEFT SHOULDER ARTHROSCOPY WITH DEBRIDEMENT, DISTAL CLAVICAL EXCISION,  SUBACROMIAL DECOMPRESSION, BICEPS TENODESIS;  Surgeon: [name], MD;  Location: [facility];  Service: Orthopedics;  Laterality: Left;    There were no vitals filed for this visit.   Subjective Assessment - 08/07/20 1146    Subjective Pt reports he has been using his Lt arm to use card reader on arm [name] at work.  After a days work, his Lt arm is sore.  "I have to resist the urge to lay on my Lt side".  Occasionally has twinge of pain in Lt shoulder with washing hair.    Patient Stated Goals use the Lt UE as normal.  HIking , build things, and shoot a gun and landscaping    Currently in Pain? No/denies    Pain Score 0-No pain              [facility] PT  Assessment - 08/07/20 0001      Assessment   Medical Diagnosis Lt RTC repair with bicep tendodesis and DCR    Referring Provider (PT) Dr Ramond Marrow    Onset Date/Surgical Date 05/31/20    Hand Dominance Right    Next MD Visit 08/23/20    Prior Therapy not for shoulder            [facility] Adult PT Treatment/Exercise - 08/07/20 0001      Shoulder Exercises: Seated   Flexion AROM;Left;5 reps   to ~100 deg     Shoulder Exercises: Prone   Other Prone Exercises Prone Lt row to neutral x 10 with cues for scap retraction/depression      Shoulder Exercises: Sidelying   External Rotation AROM;Left;10 reps    ABduction Strengthening;Left;10 reps   to 80 deg   Other Sidelying Exercises Lt thoracic rotation, open book x 8 reps, 5-10 sec hold in stretch      Shoulder Exercises: Standing   Internal Rotation AAROM;Left;10 reps   cane   Extension AAROM;5 reps;Both   cane     Shoulder Exercises: Pulleys   Flexion --   8 reps 10 sec hold   Scaption --   5 reps x 10 sec hold     Vasopneumatic  Number Minutes Vasopneumatic  --   pt declined; will ice at home.     Manual Therapy   Manual therapy comments I strip of reg Rock tape applied to Lt ant shoulder with 15% stretch to decompress tissue and increase proprioception.    Soft tissue mobilization IASTM to Lt ant/lateral shoulder and biceps brachii to decrease fascial restrictions and improve mobility.                    PT Short Term Goals - 08/02/20 1232      PT SHORT TERM GOAL #1   Title I with initial HEP and understanding of protocol    Time 4    Period Weeks    Status Achieved    Target Date 07/25/20      PT SHORT TERM GOAL #2   Title demo Lt shoulder PROM per protocol    Time 4    Period Weeks    Status Achieved    Target Date 07/25/20      PT SHORT TERM GOAL #3   Title tolerate AAROM Lt shoulder with no more than 4/10 pain    Time 4    Period Weeks    Status Achieved    Target Date 07/25/20      PT SHORT  TERM GOAL #4   Title i with self dressing and grooming    Time 4    Period Weeks    Status Achieved    Target Date 07/25/20             PT Long Term Goals - 06/27/20 1257      PT LONG TERM GOAL #1   Title independent with advanced HEP    Time 8    Period Weeks    Status New    Target Date 08/22/20      PT LONG TERM GOAL #2   Title demo Lt shoulder strength =/> 4/5 to allow initial return to recreational activities    Time 8    Period Weeks    Status New    Target Date 08/22/20      PT LONG TERM GOAL #3   Title improve FOTO =/better than 65    Time 8    Period Weeks    Status New    Target Date 08/22/20      PT LONG TERM GOAL #4   Title demo Lt shoulder and cervical ROM WFL    Time 8    Period Days    Status New    Target Date 08/22/20      PT LONG TERM GOAL #5   Title tolerate some light landscaping with no more than 3/10Lt shoudler pain    Time 8    Period Weeks    Status New    Target Date 08/22/20                 Plan - 08/07/20 1232    Clinical Impression Statement Good tolerance for AAROM/AROM of Lt shoulder.  He will be 10 wks s/p Lt shoulder surgery on 3/17 and will be able to progress to next phase of rehab protocol.    Rehab Potential Good    PT Frequency 2x / week    PT Duration 8 weeks    PT Treatment/Interventions Iontophoresis 4mg /ml Dexamethasone;Taping;Vasopneumatic Device;Patient/family education;Moist Heat;Therapeutic activities;Passive range of motion;Dry needling;Therapeutic exercise;Cryotherapy;Electrical Stimulation;Neuromuscular re-education;Manual techniques    PT Next Visit Plan progress with exercise per protocol; continue with manual  work to decrease muscular tightness and improve mobiliity; modalities as indicated    PT Home Exercise Plan [email or web address]    Consulted and Agree with Plan of Care Patient           Patient will benefit from skilled therapeutic intervention in order to improve the following deficits and  impairments:  Decreased range of motion,Impaired UE functional use,Increased muscle spasms,Pain,Postural dysfunction,Decreased strength  Visit Diagnosis: Stiffness of left shoulder, not elsewhere classified  Acute pain of left shoulder  Localized edema     Problem List Patient Active Problem List   Diagnosis Date Noted  . 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 Primary osteoarthritis involving multiple joints 07/26/2018  . Acute left ankle pain 07/26/2018  . Cerumen debris on tympanic membrane of right ear 07/26/2018  . Tinea pedis 07/21/2018  . Periscapular pain 07/20/2018  . Retrocalcaneal bursitis, left 07/20/2018  . Hepatitis B core antibody positive 07/20/2018  . Polyarthralgia 07/20/2018  . At risk for obstructive sleep apnea 07/20/2018  . Hearing loss due to cerumen impaction, right 11/02/2017  . Seasonal allergic rhinitis due to pollen 11/02/2017  . Fasting hyperglycemia 08/06/2017  . Primary osteoarthritis of right hip 08/06/2017  . Chronic right-sided low back pain without sciatica 08/06/2017  . Hypertriglyceridemia 08/06/2017  . 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  . Gastroesophageal reflux disease without esophagitis 07/09/2017  . Hypertension goal BP (blood pressure) < 130/80 07/09/2017  . Class 2 obesity due to excess calories in adult 07/09/2017   Mayer Camel, PTA 08/07/20 12:35 PM  [facility] [place] [address] [place], [place], [address] Phone: [phone]   Fax:  [phone]  Name: Adrian [name] MRN: [id number] Date of Birth: [birth year]

## 2020-08-09 ENCOUNTER — Encounter: Payer: Self-pay | Admitting: Physical Therapy

## 2020-08-09 ENCOUNTER — Other Ambulatory Visit: Payer: Self-pay

## 2020-08-09 ENCOUNTER — Ambulatory Visit: Payer: BC Managed Care – PPO | Admitting: Physical Therapy

## 2020-08-09 DIAGNOSIS — M6281 Muscle weakness (generalized): Secondary | ICD-10-CM | POA: Diagnosis not present

## 2020-08-09 DIAGNOSIS — M25612 Stiffness of left shoulder, not elsewhere classified: Secondary | ICD-10-CM

## 2020-08-09 DIAGNOSIS — M25512 Pain in left shoulder: Secondary | ICD-10-CM | POA: Diagnosis not present

## 2020-08-09 NOTE — Patient Instructions (Signed)
Access Code: [email or web address] URL: [email or web address] Date: 08/09/2020 Prepared by: [facility]  Exercises Flexion-Extension Shoulder Pendulum with Table Support - 1 x daily - 7 x weekly - 15 reps - 3 sets Seated Scapular Retraction - 3-4 x daily - 7 x weekly - 10 reps - 5 sec hold Standing 'L' Stretch at [employer] - 3-4 x daily - 10-15 reps Seated Shoulder External Rotation AAROM with Cane and Hand in Neutral - 2 x daily - 7 x weekly - 1 sets - 5-10 reps - 5-10 sec hold Seated Shoulder Flexion AAROM with Pulley Behind - 2 x daily - 7 x weekly - 1 sets - 10 reps - 10 sec hold Seated Shoulder Scaption AAROM with Pulley at Side - 2 x daily - 7 x weekly - 1 sets - 10 reps - 10sec hold Supine Shoulder Flexion Extension AAROM with [place] - 2 x daily - 7 x weekly - 1 sets - 10 reps - 3-5sec hold [place] Stretch - 1 x daily - 7 x weekly - 1 sets - 2-3 reps - 20 seconds hold Split Stance Shoulder Row with Resistance - 1 x daily - 3 x weekly - 2-3 sets - 10 reps Shoulder External Rotation with Anchored Resistance - 1 x daily - 3 x weekly - 2-3 sets - 10 reps Standing Shoulder Internal Rotation with Anchored Resistance - 1 x daily - 3 x weekly - 2-3 sets - 10 reps

## 2020-08-09 NOTE — Therapy (Signed)
[place], [place], [address] Phone: [phone]   Fax:  [phone]  Physical Therapy Treatment  Patient Details  Name: [name] MRN: [id number] Date of Birth: [birth year] Referring Provider (PT): Dr Ophelia Charter   Encounter Date: 08/09/2020   PT End of Session - 08/09/20 1145    Visit Number 11    Number of Visits 16    Date for PT Re-Evaluation 08/22/20    Authorization Type BCBS    PT Start Time 1101    PT Stop Time 1140    PT Time Calculation (min) 39 min    Activity Tolerance Patient tolerated treatment well    Behavior During Therapy [facility] for tasks assessed/performed           Past Medical History:  Diagnosis Date  . Chronic right hip pain 08/06/2017  . Chronic right-sided low back pain without sciatica 08/06/2017  . GERD (gastroesophageal reflux disease)   . Hypertension   . Hypertriglyceridemia 08/06/2017  . Seasonal allergies     Past Surgical History:  Procedure Laterality Date  . ESOPHAGOGASTRODUODENOSCOPY    . NO PAST SURGERIES    . SHOULDER ARTHROSCOPY WITH SUBACROMIAL DECOMPRESSION AND BICEP TENDON REPAIR Left 05/31/2020   Procedure: LEFT SHOULDER ARTHROSCOPY WITH DEBRIDEMENT, DISTAL CLAVICAL EXCISION,  SUBACROMIAL DECOMPRESSION, BICEPS TENODESIS;  Surgeon: Hiram Gash, MD;  Location: [place];  Service: Orthopedics;  Laterality: Left;    There were no vitals filed for this visit.   Subjective Assessment - 08/09/20 1107    Subjective Pt reports some soreness in his Lt shoulder from using ATM (in car) prior to arrival.  He also reports persistant tightness at end of day.    Patient Stated Goals use the Lt UE as normal.  HIking , build things, and shoot a gun and landscaping    Currently in Pain? Yes    Pain Score 3     Pain Location Shoulder    Pain Orientation Left;Posterior    Pain Descriptors / Indicators Sore    Aggravating Factors  reaching,  pushing resisted doors.              [facility] PT Assessment - 08/09/20 0001      Assessment   Medical Diagnosis Lt RTC repair with bicep tendodesis and DCR    Referring Provider (PT) Dr Ophelia Charter    Onset Date/Surgical Date 05/31/20    Hand Dominance Right    Next MD Visit 08/23/20    Prior Therapy not for shoulder      PROM   Left Shoulder Flexion 138 Degrees   AAROM, cane in supine   Left Shoulder External Rotation 80 Degrees   scaption, AAROM with cane           [facility] Adult PT Treatment/Exercise - 08/09/20 0001      Shoulder Exercises: Supine   External Rotation AAROM;Left;5 reps   cane, 10 sec holds   Flexion AAROM;5 reps   cane, 10 sec hold.     Shoulder Exercises: Sidelying   Other Sidelying Exercises Lt thoracic rotation, open book x 5 reps, 5-10 sec hold in stretch      Shoulder Exercises: Standing   External Rotation Strengthening;Left;10 reps;Theraband   2 sets   Theraband Level (Shoulder External Rotation) Level 1 (Yellow)    Internal Rotation Strengthening;Left;10 reps;Theraband   2 sets   Theraband Level (Shoulder Internal Rotation) Level 1 (Yellow)    Flexion Left;Strengthening;10  reps   forward punch, 2 setss   Theraband Level (Shoulder Flexion) Level 1 (Yellow)    Row Strengthening;Left;10 reps   2 sets   Theraband Level (Shoulder Row) Level 1 (Yellow)      Shoulder Exercises: ROM/Strengthening   UBE (Upper Arm Bike) L1: standing, 30 sec forward/ 30 sec backward, to 3 min.      Shoulder Exercises: Stretch   Corner Stretch 2 reps;20 seconds   midlevel   Star Gazer Stretch 20 seconds;3 reps   3rd rep with LTR Rt   Other Shoulder Stretches trial of midlevel doorway stretch - switched to corner with improved tolerance.      Vasopneumatic   Number Minutes Vasopneumatic  --   pt declined; will ice at home.                 PT Education - 08/09/20 1145    Education Details HEP, issued yellow band    Person(s) Educated Patient    Methods  Explanation;Handout;Verbal cues;Demonstration    Comprehension Verbalized understanding;Returned demonstration            PT Short Term Goals - 08/02/20 1232      PT SHORT TERM GOAL #1   Title I with initial HEP and understanding of protocol    Time 4    Period Weeks    Status Achieved    Target Date 07/25/20      PT SHORT TERM GOAL #2   Title demo Lt shoulder PROM per protocol    Time 4    Period Weeks    Status Achieved    Target Date 07/25/20      PT SHORT TERM GOAL #3   Title tolerate AAROM Lt shoulder with no more than 4/10 pain    Time 4    Period Weeks    Status Achieved    Target Date 07/25/20      PT SHORT TERM GOAL #4   Title i with self dressing and grooming    Time 4    Period Weeks    Status Achieved    Target Date 07/25/20             PT Long Term Goals - 08/09/20 1132      PT LONG TERM GOAL #1   Title independent with advanced HEP    Time 8    Period Weeks    Status On-going      PT LONG TERM GOAL #2   Title demo Lt shoulder strength =/> 4/5 to allow initial return to recreational activities    Time 8    Period Weeks    Status On-going      PT LONG TERM GOAL #3   Title improve FOTO =/better than 65    Time 8    Period Weeks    Status On-going      PT LONG TERM GOAL #4   Title demo Lt shoulder and cervical ROM WFL    Time 8    Period Days    Status Partially Met      PT LONG TERM GOAL #5   Title tolerate some light landscaping with no more than 3/10Lt shoudler pain    Time 8    Period Weeks    Status New                 Plan - 08/09/20 1129    Clinical Impression Statement Pt tolerated light resistance for Lt shoulder strengthening well,  without increase in pain.  Pt continues with end range tightness; addressed with continued AAROM exercises in supine with scap stabilization.  Progressing well within rehab protocol.    Rehab Potential Good    PT Frequency 2x / week    PT Duration 8 weeks    PT  Treatment/Interventions Iontophoresis 75m/ml Dexamethasone;Taping;Vasopneumatic Device;Patient/family education;Moist Heat;Therapeutic activities;Passive range of motion;Dry needling;Therapeutic exercise;Cryotherapy;Electrical Stimulation;Neuromuscular re-education;Manual techniques    PT Next Visit Plan progress with exercise per protocol; continue with manual work to decrease muscular tightness and improve mobiliity; modalities as indicated    PT Home Exercise Plan [id number]    Consulted and Agree with Plan of Care Patient           Patient will benefit from skilled therapeutic intervention in order to improve the following deficits and impairments:  Decreased range of motion,Impaired UE functional use,Increased muscle spasms,Pain,Postural dysfunction,Decreased strength  Visit Diagnosis: Stiffness of left shoulder, not elsewhere classified  Acute pain of left shoulder  Muscle weakness (generalized)     Problem List Patient Active Problem List   Diagnosis Date Noted  . 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 Primary osteoarthritis involving multiple joints 07/26/2018  . Acute left ankle pain 07/26/2018  . Cerumen debris on tympanic membrane of right ear 07/26/2018  . Tinea pedis 07/21/2018  . Periscapular pain 07/20/2018  . Retrocalcaneal bursitis, left 07/20/2018  . Hepatitis B core antibody positive 07/20/2018  . Polyarthralgia 07/20/2018  . At risk for obstructive sleep apnea 07/20/2018  . Hearing loss due to cerumen impaction, right 11/02/2017  . Seasonal allergic rhinitis due to pollen 11/02/2017  . Fasting hyperglycemia 08/06/2017  . Primary osteoarthritis of right hip 08/06/2017  . Chronic right-sided low back pain without sciatica 08/06/2017  . Hypertriglyceridemia 08/06/2017  . 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  . Gastroesophageal reflux disease without esophagitis 07/09/2017  . Hypertension goal BP (blood pressure) < 130/80 07/09/2017  .  Class 2 obesity due to excess calories in adult 07/09/2017   [name] PTA 08/09/20 11:46 AM  [place] [address]Phone: [name]  Fax:  [name] Name: [place] OGATAMRN: [id number] of Birth: 910/05/1960

## 2020-08-14 ENCOUNTER — Ambulatory Visit: Payer: BC Managed Care – PPO | Admitting: Physical Therapy

## 2020-08-14 ENCOUNTER — Other Ambulatory Visit: Payer: Self-pay

## 2020-08-14 ENCOUNTER — Encounter: Payer: Self-pay | Admitting: Physical Therapy

## 2020-08-14 DIAGNOSIS — M6281 Muscle weakness (generalized): Secondary | ICD-10-CM | POA: Diagnosis not present

## 2020-08-14 DIAGNOSIS — M25512 Pain in left shoulder: Secondary | ICD-10-CM

## 2020-08-14 DIAGNOSIS — M25612 Stiffness of left shoulder, not elsewhere classified: Secondary | ICD-10-CM

## 2020-08-14 NOTE — Therapy (Signed)
[place], [place], [address] Phone: [phone]   Fax:  [phone]  Physical Therapy Treatment  Patient Details  Name: [name] MRN: [id number] Date of Birth: 16-Apr-1961 Referring Provider (PT): Dr Ophelia Charter   Encounter Date: 08/14/2020   PT End of Session - 08/14/20 1019    Visit Number 12    Number of Visits 16    Date for PT Re-Evaluation 08/22/20    Authorization Type BCBS    PT Start Time 1019    PT Stop Time 1058    PT Time Calculation (min) 39 min    Activity Tolerance Patient tolerated treatment well    Behavior During Therapy [facility] for tasks assessed/performed           Past Medical History:  Diagnosis Date  . Chronic right hip pain 08/06/2017  . Chronic right-sided low back pain without sciatica 08/06/2017  . GERD (gastroesophageal reflux disease)   . Hypertension   . Hypertriglyceridemia 08/06/2017  . Seasonal allergies     Past Surgical History:  Procedure Laterality Date  . ESOPHAGOGASTRODUODENOSCOPY    . NO PAST SURGERIES    . SHOULDER ARTHROSCOPY WITH SUBACROMIAL DECOMPRESSION AND BICEP TENDON REPAIR Left 05/31/2020   Procedure: LEFT SHOULDER ARTHROSCOPY WITH DEBRIDEMENT, DISTAL CLAVICAL EXCISION,  SUBACROMIAL DECOMPRESSION, BICEPS TENODESIS;  Surgeon: Hiram Gash, MD;  Location: [place];  Service: Orthopedics;  Laterality: Left;    There were no vitals filed for this visit.   Subjective Assessment - 08/14/20 1019    Subjective Pt reports he had 4 occasions of pain "pinching" doing everyday activities.  He had elevated pain at bed -7/10.  Took 2 tylenol, 2 gabapentin, and 1 oxycodone for pain relief.  He doesn't feel like he did anything different to aggrivate shoulder. "The heaviest thing I lifted was my coffee cup".    Patient Stated Goals use the Lt UE as normal.  HIking , build things, and shoot a gun and landscaping    Currently in Pain? Yes     Pain Score 1     Pain Location Shoulder    Pain Orientation Left    Pain Descriptors / Indicators Sore    Aggravating Factors  reaching, pushing resisted doors    Pain Relieving Factors meds, ice              [facility] PT Assessment - 08/14/20 0001      Assessment   Medical Diagnosis Lt RTC repair with bicep tendodesis and DCR    Referring Provider (PT) Dr Ophelia Charter    Onset Date/Surgical Date 05/31/20    Hand Dominance Right    Next MD Visit 08/23/20    Prior Therapy not for shoulder            [facility] Adult PT Treatment/Exercise - 08/14/20 0001      Shoulder Exercises: Supine   ABduction AAROM;Left;10 reps   cane   Other Supine Exercises bilat Ls x 8 reps, 3 sec hold.      Shoulder Exercises: Sidelying   Other Sidelying Exercises Lt thoracic rotation, open book x 5 reps, 5-10 sec hold in stretch      Shoulder Exercises: Standing   External Rotation Strengthening;Left;10 reps;Theraband   2 sets   Theraband Level (Shoulder External Rotation) Level 1 (Yellow)    Internal Rotation Strengthening;Left;10 reps;Theraband   2 sets   Theraband Level (Shoulder Internal Rotation) Level 1 (Yellow)  Row Strengthening;Left;10 reps   2 sets   Theraband Level (Shoulder Row) Level 1 (Yellow)      Shoulder Exercises: ROM/Strengthening   Nustep L1: arms only x 5 min for ROM and warm up      Shoulder Exercises: Stretch   Star Gazer Stretch 20 seconds;3 reps   3rd rep with LTR Rt     Manual Therapy   Manual therapy comments I strip of reg Rock tape applied to [employer] shoulder with 15% stretch to decompress tissue and increase proprioception.    Soft tissue mobilization IASTM to Lt ant/lateral shoulder and biceps brachii to decrease fascial restrictions and improve mobility.                    PT Short Term Goals - 08/02/20 1232      PT SHORT TERM GOAL #1   Title I with initial HEP and understanding of protocol    Time 4    Period Weeks    Status Achieved    Target  Date 07/25/20      PT SHORT TERM GOAL #2   Title demo Lt shoulder PROM per protocol    Time 4    Period Weeks    Status Achieved    Target Date 07/25/20      PT SHORT TERM GOAL #3   Title tolerate AAROM Lt shoulder with no more than 4/10 pain    Time 4    Period Weeks    Status Achieved    Target Date 07/25/20      PT SHORT TERM GOAL #4   Title i with self dressing and grooming    Time 4    Period Weeks    Status Achieved    Target Date 07/25/20             PT Long Term Goals - 08/09/20 1132      PT LONG TERM GOAL #1   Title independent with advanced HEP    Time 8    Period Weeks    Status On-going      PT LONG TERM GOAL #2   Title demo Lt shoulder strength =/> 4/5 to allow initial return to recreational activities    Time 8    Period Weeks    Status On-going      PT LONG TERM GOAL #3   Title improve FOTO =/better than 65    Time 8    Period Weeks    Status On-going      PT LONG TERM GOAL #4   Title demo Lt shoulder and cervical ROM WFL    Time 8    Period Days    Status Partially Met      PT LONG TERM GOAL #5   Title tolerate some light landscaping with no more than 3/10Lt shoudler pain    Time 8    Period Weeks    Status New                 Plan - 08/14/20 1035    Clinical Impression Statement Pt reporting more episodes of spasms since adding resistance exercises last week.  Discussed freq of exercises to avoid over doing it; reviewed rehab protocol.  Pt unable to tolerate supine bilat shoulder ER with yellow band due to increase in Lt shoulder discomfort; eased with supine L's.  Able to tolerate all standing light resistance exercises without pain or difficulty.    Rehab Potential Good  PT Frequency 2x / week    PT Duration 8 weeks    PT Treatment/Interventions Iontophoresis 13m/ml Dexamethasone;Taping;Vasopneumatic Device;Patient/family education;Moist Heat;Therapeutic activities;Passive range of motion;Dry needling;Therapeutic  exercise;Cryotherapy;Electrical Stimulation;Neuromuscular re-education;Manual techniques    PT Next Visit Plan progress with exercise per protocol; continue with manual work to decrease muscular tightness and improve mobiliity; modalities as indicated -  MD note for upcoming appt on 3/31. FOTO   PT Home Exercise Plan [id number]    Consulted and Agree with Plan of Care Patient           Patient will benefit from skilled therapeutic intervention in order to improve the following deficits and impairments:  Decreased range of motion,Impaired UE functional use,Increased muscle spasms,Pain,Postural dysfunction,Decreased strength  Visit Diagnosis: Stiffness of left shoulder, not elsewhere classified  Acute pain of left shoulder  Muscle weakness (generalized)     Problem List Patient Active Problem List   Diagnosis Date Noted  . 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 Primary osteoarthritis involving multiple joints 07/26/2018  . Acute left ankle pain 07/26/2018  . Cerumen debris on tympanic membrane of right ear 07/26/2018  . Tinea pedis 07/21/2018  . Periscapular pain 07/20/2018  . Retrocalcaneal bursitis, left 07/20/2018  . Hepatitis B core antibody positive 07/20/2018  . Polyarthralgia 07/20/2018  . At risk for obstructive sleep apnea 07/20/2018  . Hearing loss due to cerumen impaction, right 11/02/2017  . Seasonal allergic rhinitis due to pollen 11/02/2017  . Fasting hyperglycemia 08/06/2017  . Primary osteoarthritis of right hip 08/06/2017  . Chronic right-sided low back pain without sciatica 08/06/2017  . Hypertriglyceridemia 08/06/2017  . 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  . Gastroesophageal reflux disease without esophagitis 07/09/2017  . Hypertension goal BP (blood pressure) < 130/80 07/09/2017  . Class 2 obesity due to excess calories in adult 07/09/2017   JKerin Perna PTA 08/14/20 11:05 AM  [email or web address] [address]Phone: [phone]  Fax:  [phone] Name: [place] CAPSHAWMRN: [id number] of Birth: 903/20/1962

## 2020-08-16 ENCOUNTER — Encounter: Payer: Self-pay | Admitting: Rehabilitative and Restorative Service Providers"

## 2020-08-16 ENCOUNTER — Ambulatory Visit: Payer: BC Managed Care – PPO | Admitting: Rehabilitative and Restorative Service Providers"

## 2020-08-16 ENCOUNTER — Other Ambulatory Visit: Payer: Self-pay

## 2020-08-16 DIAGNOSIS — M25512 Pain in left shoulder: Secondary | ICD-10-CM

## 2020-08-16 DIAGNOSIS — M25612 Stiffness of left shoulder, not elsewhere classified: Secondary | ICD-10-CM

## 2020-08-16 DIAGNOSIS — M6281 Muscle weakness (generalized): Secondary | ICD-10-CM | POA: Diagnosis not present

## 2020-08-16 DIAGNOSIS — R600 Localized edema: Secondary | ICD-10-CM | POA: Diagnosis not present

## 2020-08-16 NOTE — Therapy (Signed)
[place], [place], [address] Phone: [phone]   Fax:  [phone]  Physical Therapy Treatment  Patient Details  Name: [name] MRN: [id number] Date of Birth: 1960/08/31 Referring Provider (PT): Dr Ophelia Charter   Encounter Date: 08/16/2020   PT End of Session - 08/16/20 1109    Visit Number 13    Number of Visits 16    Date for PT Re-Evaluation 08/22/20    Authorization Type BCBS    PT Start Time 1105    PT Stop Time 1155    PT Time Calculation (min) 50 min    Activity Tolerance Patient tolerated treatment well           Past Medical History:  Diagnosis Date  . Chronic right hip pain 08/06/2017  . Chronic right-sided low back pain without sciatica 08/06/2017  . GERD (gastroesophageal reflux disease)   . Hypertension   . Hypertriglyceridemia 08/06/2017  . Seasonal allergies     Past Surgical History:  Procedure Laterality Date  . ESOPHAGOGASTRODUODENOSCOPY    . NO PAST SURGERIES    . SHOULDER ARTHROSCOPY WITH SUBACROMIAL DECOMPRESSION AND BICEP TENDON REPAIR Left 05/31/2020   Procedure: LEFT SHOULDER ARTHROSCOPY WITH DEBRIDEMENT, DISTAL CLAVICAL EXCISION,  SUBACROMIAL DECOMPRESSION, BICEPS TENODESIS;  Surgeon: Hiram Gash, MD;  Location: [place];  Service: Orthopedics;  Laterality: Left;    There were no vitals filed for this visit.   Subjective Assessment - 08/16/20 1109    Subjective Patient reports some soreness inthe shoulder today. Notices that he has a tremor in the Lt > Rt hand with any effort to lift his arm, holding arm out. Rt shoulder is overworking from compensating for the Lt    Currently in Pain? Yes    Pain Score 1     Pain Location Shoulder    Pain Orientation Left    Pain Descriptors / Indicators Aching    Pain Type Surgical pain;Chronic pain                             [facility] Adult PT Treatment/Exercise - 08/16/20 0001       Shoulder Exercises: Supine   Other Supine Exercises prolonged snow angel; chest lift/scap squeeze 10 sec x 10      Shoulder Exercises: Seated   Other Seated Exercises scapular depression with Lt UE pressing onto the ball 5 sec hold x 10 reps      Shoulder Exercises: Standing   External Rotation Strengthening;Left;10 reps;Theraband   2 sets   Theraband Level (Shoulder External Rotation) Level 1 (Yellow)    Internal Rotation Strengthening;Left;10 reps;Theraband   2 sets   Theraband Level (Shoulder Internal Rotation) Level 1 (Yellow)    Flexion Left;Strengthening;5 reps   focus on scap squeese to avoid rounded shoulders   Theraband Level (Shoulder Flexion) Level 1 (Yellow)    Row Strengthening;Left;10 reps   2 sets   Theraband Level (Shoulder Row) Level 1 (Yellow)      Shoulder Exercises: Pulleys   Flexion --   10 sec hold x 10 reps   Scaption --   10 sec hold x 10 reps     Cryotherapy   Number Minutes Cryotherapy 10 Minutes    Cryotherapy Location Shoulder    Type of Cryotherapy Ice pack      Manual Therapy   Soft tissue mobilization STM Lt ant/lateral shoulder and biceps brachii to  decrease fascial restrictions and improve mobility.    Scapular Mobilization Lt scapula    Passive ROM Lt shoulder flexion; scaption; ER; gentle extension and horizontal abduction to pt tolerance within protocol    Manual Traction traction through the long arm Lt UE 5-10 sec several reps during PROM/stretching                  PT Education - 08/16/20 1135    Education Details HEP issued to red TB    Person(s) Educated Patient    Methods Explanation;Demonstration;Tactile cues;Verbal cues;Handout    Comprehension Verbalized understanding;Returned demonstration;Verbal cues required;Tactile cues required            PT Short Term Goals - 08/02/20 1232      PT SHORT TERM GOAL #1   Title I with initial HEP and understanding of protocol    Time 4    Period Weeks    Status Achieved     Target Date 07/25/20      PT SHORT TERM GOAL #2   Title demo Lt shoulder PROM per protocol    Time 4    Period Weeks    Status Achieved    Target Date 07/25/20      PT SHORT TERM GOAL #3   Title tolerate AAROM Lt shoulder with no more than 4/10 pain    Time 4    Period Weeks    Status Achieved    Target Date 07/25/20      PT SHORT TERM GOAL #4   Title i with self dressing and grooming    Time 4    Period Weeks    Status Achieved    Target Date 07/25/20             PT Long Term Goals - 08/09/20 1132      PT LONG TERM GOAL #1   Title independent with advanced HEP    Time 8    Period Weeks    Status On-going      PT LONG TERM GOAL #2   Title demo Lt shoulder strength =/> 4/5 to allow initial return to recreational activities    Time 8    Period Weeks    Status On-going      PT LONG TERM GOAL #3   Title improve FOTO =/better than 65    Time 8    Period Weeks    Status On-going      PT LONG TERM GOAL #4   Title demo Lt shoulder and cervical ROM WFL    Time 8    Period Days    Status Partially Met      PT LONG TERM GOAL #5   Title tolerate some light landscaping with no more than 3/10Lt shoudler pain    Time 8    Period Weeks    Status New                 Plan - 08/16/20 1114    Clinical Impression Statement Tremor with lifting arm. Added isometric step back to work on posterior shoulder girdle strength. Continued with manual work; PROM; stretching; work on ROM per protocol.    Rehab Potential Good    PT Frequency 2x / week    PT Duration 8 weeks    PT Treatment/Interventions Iontophoresis 81m/ml Dexamethasone;Taping;Vasopneumatic Device;Patient/family education;Moist Heat;Therapeutic activities;Passive range of motion;Dry needling;Therapeutic exercise;Cryotherapy;Electrical Stimulation;Neuromuscular re-education;Manual techniques    PT Next Visit Plan progress with exercise per protocol; continue with manual  work to decrease muscular tightness and  improve mobiliity; modalities as indicated    PT Home Exercise Plan [id number]    Consulted and Agree with Plan of Care Patient           Patient will benefit from skilled therapeutic intervention in order to improve the following deficits and impairments:     Visit Diagnosis: Stiffness of left shoulder, not elsewhere classified  Acute pain of left shoulder  Muscle weakness (generalized)  Localized edema     Problem List Patient Active Problem List   Diagnosis Date Noted  . 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 Primary osteoarthritis involving multiple joints 07/26/2018  . Acute left ankle pain 07/26/2018  . Cerumen debris on tympanic membrane of right ear 07/26/2018  . Tinea pedis 07/21/2018  . Periscapular pain 07/20/2018  . Retrocalcaneal bursitis, left 07/20/2018  . Hepatitis B core antibody positive 07/20/2018  . Polyarthralgia 07/20/2018  . At risk for obstructive sleep apnea 07/20/2018  . Hearing loss due to cerumen impaction, right 11/02/2017  . Seasonal allergic rhinitis due to pollen 11/02/2017  . Fasting hyperglycemia 08/06/2017  . Primary osteoarthritis of right hip 08/06/2017  . Chronic right-sided low back pain without sciatica 08/06/2017  . Hypertriglyceridemia 08/06/2017  . 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  . Gastroesophageal reflux disease without esophagitis 07/09/2017  . Hypertension goal BP (blood pressure) < 130/80 07/09/2017  . Class 2 obesity due to excess calories in adult 07/09/2017    [place] PT, MPH  08/16/2020, 12:46 PM  [facility] [place], [place], [address] Phone: [phone]   Fax:  [phone]  Name: [name] MRN: [id number] Date of Birth: 01/09/1961

## 2020-08-16 NOTE — Patient Instructions (Signed)
Access Code: [email or web address]: [email or web address]: 03/24/2022Prepared by: Suede Greenawalt HoltExercises  Flexion-Extension Shoulder Pendulum with Table Support - 1 x daily - 7 x weekly - 15 reps - 3 sets  Seated Scapular Retraction - 3-4 x daily - 7 x weekly - 10 reps - 5 sec hold  Standing 'L' Stretch at [employer] - 3-4 x daily - 10-15 reps  Seated Shoulder External Rotation AAROM with Cane and Hand in Neutral - 2 x daily - 7 x weekly - 1 sets - 5-10 reps - 5-10 sec hold  Seated Shoulder Flexion AAROM with Pulley Behind - 2 x daily - 7 x weekly - 1 sets - 10 reps - 10 sec hold  Seated Shoulder Scaption AAROM with Pulley at Side - 2 x daily - 7 x weekly - 1 sets - 10 reps - 10sec hold  Supine Shoulder Flexion Extension AAROM with Dowel - 2 x daily - 7 x weekly - 1 sets - 10 reps - 3-5sec hold  Corner Pec Major Stretch - 1 x daily - 7 x weekly - 1 sets - 2-3 reps - 20 seconds hold  Split Stance Shoulder Row with Resistance - 1 x daily - 3 x weekly - 2-3 sets - 10 reps  Shoulder External Rotation with Anchored Resistance - 1 x daily - 3 x weekly - 2-3 sets - 10 reps  Standing Shoulder Internal Rotation with Anchored Resistance - 1 x daily - 3 x weekly - 2-3 sets - 10 reps  Shoulder Extension Reactive Isometrics with Elbow at 90 - 2 x daily - 7 x weekly - 1-2 sets - 10 reps - 2-3 sec hold

## 2020-08-21 ENCOUNTER — Other Ambulatory Visit: Payer: Self-pay

## 2020-08-21 ENCOUNTER — Ambulatory Visit: Payer: BC Managed Care – PPO | Admitting: Physical Therapy

## 2020-08-21 DIAGNOSIS — M25512 Pain in left shoulder: Secondary | ICD-10-CM

## 2020-08-21 DIAGNOSIS — M25612 Stiffness of left shoulder, not elsewhere classified: Secondary | ICD-10-CM

## 2020-08-21 DIAGNOSIS — M6281 Muscle weakness (generalized): Secondary | ICD-10-CM

## 2020-08-21 NOTE — Therapy (Signed)
[place], [place], [address] Phone: [phone]   Fax:  [phone]  Physical Therapy Treatment  Patient Details  Name: Adrian [name] MRN: [id number] Date of Birth: [birth year] Referring Provider (PT): Dr Ophelia Charter   Encounter Date: 08/21/2020   PT End of Session - 08/21/20 1022    Visit Number 14    Number of Visits 16    Date for PT Re-Evaluation 08/22/20    Authorization Type BCBS    PT Start Time 1018    PT Stop Time 1100    PT Time Calculation (min) 42 min    Activity Tolerance Patient tolerated treatment well    Behavior During Therapy [facility] for tasks assessed/performed           Past Medical History:  Diagnosis Date  . Chronic right hip pain 08/06/2017  . Chronic right-sided low back pain without sciatica 08/06/2017  . GERD (gastroesophageal reflux disease)   . Hypertension   . Hypertriglyceridemia 08/06/2017  . Seasonal allergies     Past Surgical History:  Procedure Laterality Date  . ESOPHAGOGASTRODUODENOSCOPY    . NO PAST SURGERIES    . SHOULDER ARTHROSCOPY WITH SUBACROMIAL DECOMPRESSION AND BICEP TENDON REPAIR Left 05/31/2020   Procedure: LEFT SHOULDER ARTHROSCOPY WITH DEBRIDEMENT, DISTAL CLAVICAL EXCISION,  SUBACROMIAL DECOMPRESSION, BICEPS TENODESIS;  Surgeon: Hiram Gash, MD;  Location: [facility];  Service: Orthopedics;  Laterality: Left;    There were no vitals filed for this visit.   Subjective Assessment - 08/21/20 1022    Subjective Pt reports he was putting sweatshirt on this morning and had a "bunching up" feeling around shoulder blade that has been lingering.  His shoulder is not as achy now that he has decreased freq of exercises.    Patient Stated Goals use the Lt UE as normal.  HIking , build things, and shoot a gun and landscaping    Currently in Pain? Yes    Pain Score 3     Pain Location Shoulder    Pain Orientation Left    Pain Descriptors  / Indicators Tightness              [facility] PT Assessment - 08/21/20 0001      Assessment   Medical Diagnosis Lt RTC repair with bicep tendodesis and DCR    Referring Provider (PT) Dr Ophelia Charter    Onset Date/Surgical Date 05/31/20    Hand Dominance Right    Next MD Visit 08/23/20    Prior Therapy not for shoulder      AROM   AROM Assessment Site Shoulder    Right/Left Shoulder Left    Left Shoulder Extension 55 Degrees    Left Shoulder Flexion 126 Degrees    Left Shoulder ABduction 117 Degrees    Left Shoulder Internal Rotation --   thumb to L3   Left Shoulder External Rotation 74 Degrees   scaption to ~85 deg           [facility] Adult PT Treatment/Exercise - 08/21/20 0001      Shoulder Exercises: Standing   External Rotation Strengthening;Left;10 reps    Theraband Level (Shoulder External Rotation) Level 2 (Red)    Internal Rotation Strengthening;Left;10 reps    Theraband Level (Shoulder Internal Rotation) Level 2 (Red)    Extension Strengthening;Left;10 reps    Theraband Level (Shoulder Extension) Level 2 (Red)    Row Strengthening;Left;10 reps    Theraband Level (Shoulder Row) Level  2 (Red)      Shoulder Exercises: ROM/Strengthening   UBE (Upper Arm Bike) L3: 1.5 min forward/ 1.5 min backward, standing      Shoulder Exercises: Stretch   Corner Stretch 2 reps;20 seconds    Wall Stretch - Flexion 5 reps    Wall Stretch - ABduction 2 reps;10 seconds    Star Gazer Stretch 2 reps;20 seconds    Other Shoulder Stretches Lt midlevel and high doorway stretch x 15 sec each.    Other Shoulder Stretches arms in T with Rt LTR x 2 reps      Manual Therapy   Soft tissue mobilization STM to Lt pec, infraspinatus, levator scapula   Limited tolerance.   Scapular Mobilization Lt scapula - protraction, retraction              PT Short Term Goals - 08/02/20 1232      PT SHORT TERM GOAL #1   Title I with initial HEP and understanding of protocol    Time 4    Period Weeks     Status Achieved    Target Date 07/25/20      PT SHORT TERM GOAL #2   Title demo Lt shoulder PROM per protocol    Time 4    Period Weeks    Status Achieved    Target Date 07/25/20      PT SHORT TERM GOAL #3   Title tolerate AAROM Lt shoulder with no more than 4/10 pain    Time 4    Period Weeks    Status Achieved    Target Date 07/25/20      PT SHORT TERM GOAL #4   Title i with self dressing and grooming    Time 4    Period Weeks    Status Achieved    Target Date 07/25/20             PT Long Term Goals - 08/21/20 1053      PT LONG TERM GOAL #1   Title independent with advanced HEP    Time 8    Period Weeks    Status On-going      PT LONG TERM GOAL #2   Title demo Lt shoulder strength =/> 4/5 to allow initial return to recreational activities    Time 8    Period Weeks    Status On-going      PT LONG TERM GOAL #3   Title improve FOTO =/better than 65    Time 8    Period Weeks    Status On-going      PT LONG TERM GOAL #4   Title demo Lt shoulder and cervical ROM WFL    Time 8    Period Days    Status Partially Met      PT LONG TERM GOAL #5   Title tolerate some light landscaping with no more than 3/10Lt shoudler pain    Time 8    Period Weeks    Status On-going                 Plan - 08/21/20 1046    Clinical Impression Statement Pt's Lt shoulder AROM has improved.  After AAROM/stretches, pt began having spasms around Lt shoulder (at levator, infraspinatus, biceps brachii tendon area); reduced with rest. Tolerated other resistance exercises well without pain or difficulty. Progressing towards goals.    Rehab Potential Good    PT Frequency 2x / week    PT Duration  8 weeks    PT Treatment/Interventions Iontophoresis 63m/ml Dexamethasone;Taping;Vasopneumatic Device;Patient/family education;Moist Heat;Therapeutic activities;Passive range of motion;Dry needling;Therapeutic exercise;Cryotherapy;Electrical Stimulation;Neuromuscular re-education;Manual  techniques    PT Next Visit Plan MD note, FOTO, assess goals. (MD appt at 2pm same day)    PT Home Exercise Plan [email or web address]    Consulted and Agree with Plan of Care Patient           Patient will benefit from skilled therapeutic intervention in order to improve the following deficits and impairments:     Visit Diagnosis: Stiffness of left shoulder, not elsewhere classified  Acute pain of left shoulder  Muscle weakness (generalized)     Problem List Patient Active Problem List   Diagnosis Date Noted  . 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 Primary osteoarthritis involving multiple joints 07/26/2018  . Acute left ankle pain 07/26/2018  . Cerumen debris on tympanic membrane of right ear 07/26/2018  . Tinea pedis 07/21/2018  . Periscapular pain 07/20/2018  . Retrocalcaneal bursitis, left 07/20/2018  . Hepatitis B core antibody positive 07/20/2018  . Polyarthralgia 07/20/2018  . At risk for obstructive sleep apnea 07/20/2018  . Hearing loss due to cerumen impaction, right 11/02/2017  . Seasonal allergic rhinitis due to pollen 11/02/2017  . Fasting hyperglycemia 08/06/2017  . Primary osteoarthritis of right hip 08/06/2017  . Chronic right-sided low back pain without sciatica 08/06/2017  . Hypertriglyceridemia 08/06/2017  . 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  . Gastroesophageal reflux disease without esophagitis 07/09/2017  . Hypertension goal BP (blood pressure) < 130/80 07/09/2017  . Class 2 obesity due to excess calories in adult 07/09/2017   JKerin Perna PTA 08/21/20 11:08 AM  [place] [address]Phone: [phone]  Fax:  [phone] Name: Adrian SWEETINMRN: [id number] of Birth: 917-Jul-1962

## 2020-08-23 ENCOUNTER — Ambulatory Visit: Payer: BC Managed Care – PPO | Admitting: Rehabilitative and Restorative Service Providers"

## 2020-08-23 ENCOUNTER — Encounter: Payer: Self-pay | Admitting: Rehabilitative and Restorative Service Providers"

## 2020-08-23 ENCOUNTER — Other Ambulatory Visit: Payer: Self-pay

## 2020-08-23 DIAGNOSIS — M25512 Pain in left shoulder: Secondary | ICD-10-CM | POA: Diagnosis not present

## 2020-08-23 DIAGNOSIS — M25612 Stiffness of left shoulder, not elsewhere classified: Secondary | ICD-10-CM

## 2020-08-23 DIAGNOSIS — M6281 Muscle weakness (generalized): Secondary | ICD-10-CM

## 2020-08-23 DIAGNOSIS — R600 Localized edema: Secondary | ICD-10-CM | POA: Diagnosis not present

## 2020-08-23 NOTE — Therapy (Signed)
[facility] [place] [address] [place], [place], [address] Phone: [phone]   Fax:  [phone]  Physical Therapy Treatment  Patient Details  Name: Adrian [name] MRN: [id number] Date of Birth: July 09, 1960 Referring Provider (PT): Dr Ramond Marrow   Encounter Date: 08/23/2020   PT End of Session - 08/23/20 1007    Visit Number 15    Number of Visits 28    Date for PT Re-Evaluation 10/03/20    Authorization Type BCBS    PT Start Time 1010    PT Stop Time 1100    PT Time Calculation (min) 50 min    Activity Tolerance Patient tolerated treatment well           Past Medical History:  Diagnosis Date  . Chronic right hip pain 08/06/2017  . Chronic right-sided low back pain without sciatica 08/06/2017  . GERD (gastroesophageal reflux disease)   . Hypertension   . Hypertriglyceridemia 08/06/2017  . Seasonal allergies     Past Surgical History:  Procedure Laterality Date  . ESOPHAGOGASTRODUODENOSCOPY    . NO PAST SURGERIES    . SHOULDER ARTHROSCOPY WITH SUBACROMIAL DECOMPRESSION AND BICEP TENDON REPAIR Left 05/31/2020   Procedure: LEFT SHOULDER ARTHROSCOPY WITH DEBRIDEMENT, DISTAL CLAVICAL EXCISION,  SUBACROMIAL DECOMPRESSION, BICEPS TENODESIS;  Surgeon: [name], MD;  Location: [facility];  Service: Orthopedics;  Laterality: Left;    There were no vitals filed for this visit.   Subjective Assessment - 08/23/20 1029    Subjective Shoulder is okay - continues to improve slowly. Rt shoulder is hurting more than the Lt    Currently in Pain? No/denies    Pain Score 0-No pain              [facility] PT Assessment - 08/23/20 0001      Assessment   Medical Diagnosis Lt RTC repair with bicep tendodesis and DCR    Referring Provider (PT) Dr Ramond Marrow    Onset Date/Surgical Date 05/31/20    Hand Dominance Right    Next MD Visit 08/23/20    Prior Therapy not for shoulder      Observation/Other Assessments    Focus on Therapeutic Outcomes (FOTO)  functional limitation score 59      AROM   Left Shoulder Extension 55 Degrees    Left Shoulder Flexion 126 Degrees    Left Shoulder ABduction 117 Degrees    Left Shoulder Internal Rotation --   thumb to L3   Left Shoulder External Rotation 74 Degrees   scapular plane ~ 85 deg     PROM   Left Shoulder Flexion 135 Degrees    Left Shoulder ABduction 141 Degrees   scaption   Left Shoulder Internal Rotation 42 Degrees   in scaption   Left Shoulder External Rotation 82 Degrees   in scaption     Palpation   Palpation comment tightness through Lt pecs, biceps and posterior shoulder                         [facility] Adult PT Treatment/Exercise - 08/23/20 0001      Shoulder Exercises: Standing   External Rotation Strengthening;Left;10 reps;Theraband    Theraband Level (Shoulder External Rotation) Level 2 (Red)    Internal Rotation Strengthening;Left;10 reps;Theraband   IR to neutral   Theraband Level (Shoulder Internal Rotation) Level 2 (Red)    Flexion AROM;Strengthening;Both;10 reps   to ~ 80 deg; focus on posture  and posterior shoulder girdle control; slow eccentric control   ABduction AROM;Strengthening;Both;10 reps   scaption to ~ 80-90 deg; focus on posture and posterior shoulder girdle control; slow eccentric lowering   Extension Strengthening;Left;10 reps;Theraband    Theraband Level (Shoulder Extension) Level 3 (Green)    Row Strengthening;Both;10 reps;Theraband    Theraband Level (Shoulder Row) Level 3 (Green)      Shoulder Exercises: ROM/Strengthening   UBE (Upper Arm Bike) L4:  alternating backward/forward each minute, standing      Shoulder Exercises: Stretch   Wall Stretch - ABduction 2 reps;10 seconds    Other Shoulder Stretches biceps stretch 30 sec x 2 at doorway; low/mid/high positions 15 sec x 2 reps each    Other Shoulder Stretches arms in T with Rt LTR x 2 reps      Manual Therapy   Manual therapy comments pt supine     Soft tissue mobilization through anterior shoulder/pecs; upper trap; leveator; biceps    Scapular Mobilization Lt scapula - protraction, retraction    Passive ROM Lt shoulder flexion; scaption; ER; gentle extension and horizontal abduction to pt tolerance within protocol    Manual Traction traction through the long arm Lt UE 5-10 sec several reps during PROM/stretching    Kinesiotex --   2 strips in T anterior shoulder to inhibit tightness                   PT Short Term Goals - 08/02/20 1232      PT SHORT TERM GOAL #1   Title I with initial HEP and understanding of protocol    Time 4    Period Weeks    Status Achieved    Target Date 07/25/20      PT SHORT TERM GOAL #2   Title demo Lt shoulder PROM per protocol    Time 4    Period Weeks    Status Achieved    Target Date 07/25/20      PT SHORT TERM GOAL #3   Title tolerate AAROM Lt shoulder with no more than 4/10 pain    Time 4    Period Weeks    Status Achieved    Target Date 07/25/20      PT SHORT TERM GOAL #4   Title i with self dressing and grooming    Time 4    Period Weeks    Status Achieved    Target Date 07/25/20             PT Long Term Goals - 08/23/20 1032      PT LONG TERM GOAL #1   Title independent with advanced HEP    Time 6    Status On-going    Target Date 10/03/20      PT LONG TERM GOAL #2   Title demo Lt shoulder strength =/> 4/5 to allow initial return to recreational activities    Time 6    Period Weeks    Status On-going    Target Date 10/03/20      PT LONG TERM GOAL #3   Title improve FOTO =/better than 65    Time 6    Period Weeks    Status On-going    Target Date 10/03/20      PT LONG TERM GOAL #4   Title demo Lt shoulder and cervical ROM WFL    Time 6    Period Weeks    Status On-going    Target Date 10/03/20  PT LONG TERM GOAL #5   Title tolerate some light landscaping with no more than 3/10Lt shoudler pain    Time 6    Period Weeks    Status  On-going    Target Date 10/03/20                 Plan - 08/23/20 1011    Clinical Impression Statement Patient continues to demonstrate improvement in Lt shoulder with decreased pain and increased AROM, PROM, function. He is progressing well per protocol. short term goals accomplished. Progressing well toward long term goals.    Rehab Potential Good    PT Frequency 2x / week    PT Duration 6 weeks    PT Treatment/Interventions Iontophoresis 4mg /ml Dexamethasone;Taping;Vasopneumatic Device;Patient/family education;Moist Heat;Therapeutic activities;Passive range of motion;Dry needling;Therapeutic exercise;Cryotherapy;Electrical Stimulation;Neuromuscular re-education;Manual techniques    PT Next Visit Plan Continue shoulder rehab per provided protocol progressing end range mobility/ROM; strength; function Lt UE: work on end range passively    PT Home Exercise Plan [email or web address]    Consulted and Agree with Plan of Care Patient           Patient will benefit from skilled therapeutic intervention in order to improve the following deficits and impairments:     Visit Diagnosis: Stiffness of left shoulder, not elsewhere classified  Acute pain of left shoulder  Muscle weakness (generalized)  Localized edema     Problem List Patient Active Problem List   Diagnosis Date Noted  . 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 Primary osteoarthritis involving multiple joints 07/26/2018  . Acute left ankle pain 07/26/2018  . Cerumen debris on tympanic membrane of right ear 07/26/2018  . Tinea pedis 07/21/2018  . Periscapular pain 07/20/2018  . Retrocalcaneal bursitis, left 07/20/2018  . Hepatitis B core antibody positive 07/20/2018  . Polyarthralgia 07/20/2018  . At risk for obstructive sleep apnea 07/20/2018  . Hearing loss due to cerumen impaction, right 11/02/2017  . Seasonal allergic rhinitis due to pollen 11/02/2017  . Fasting hyperglycemia 08/06/2017  .  Primary osteoarthritis of right hip 08/06/2017  . Chronic right-sided low back pain without sciatica 08/06/2017  . Hypertriglyceridemia 08/06/2017  . 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  . Gastroesophageal reflux disease without esophagitis 07/09/2017  . Hypertension goal BP (blood pressure) < 130/80 07/09/2017  . Class 2 obesity due to excess calories in adult 07/09/2017    Emilyann Banka 07/11/2017 PT, MPH  08/23/2020, 11:02 AM  [facility] [address] [place], [place], [place] Phone: [phone]   Fax:  [phone]  Name: Adrian [name] MRN: Frankey Poot Date of Birth: 1960-08-17

## 2020-08-28 ENCOUNTER — Other Ambulatory Visit: Payer: Self-pay

## 2020-08-28 ENCOUNTER — Ambulatory Visit: Payer: BC Managed Care – PPO | Admitting: Rehabilitative and Restorative Service Providers"

## 2020-08-28 DIAGNOSIS — M25612 Stiffness of left shoulder, not elsewhere classified: Secondary | ICD-10-CM

## 2020-08-28 DIAGNOSIS — M25512 Pain in left shoulder: Secondary | ICD-10-CM | POA: Diagnosis not present

## 2020-08-28 DIAGNOSIS — M6281 Muscle weakness (generalized): Secondary | ICD-10-CM

## 2020-08-28 DIAGNOSIS — R600 Localized edema: Secondary | ICD-10-CM | POA: Diagnosis not present

## 2020-08-28 NOTE — Therapy (Signed)
[facility] [place] [address] [place], [place], [address] Phone: [phone]   Fax:  [phone]  Physical Therapy Treatment  Patient Details  Name: Adrian [name] MRN: [id number] Date of Birth: [birth year] Referring Provider (PT): Dr Ramond Marrow   Encounter Date: 08/28/2020   PT End of Session - 08/28/20 1022    Visit Number 16    Number of Visits 28    Date for PT Re-Evaluation 10/03/20    Authorization Type BCBS    PT Start Time 1018    PT Stop Time 1100    PT Time Calculation (min) 42 min    Activity Tolerance Patient tolerated treatment well           Past Medical History:  Diagnosis Date  . Chronic right hip pain 08/06/2017  . Chronic right-sided low back pain without sciatica 08/06/2017  . GERD (gastroesophageal reflux disease)   . Hypertension   . Hypertriglyceridemia 08/06/2017  . Seasonal allergies     Past Surgical History:  Procedure Laterality Date  . ESOPHAGOGASTRODUODENOSCOPY    . NO PAST SURGERIES    . SHOULDER ARTHROSCOPY WITH SUBACROMIAL DECOMPRESSION AND BICEP TENDON REPAIR Left 05/31/2020   Procedure: LEFT SHOULDER ARTHROSCOPY WITH DEBRIDEMENT, DISTAL CLAVICAL EXCISION,  SUBACROMIAL DECOMPRESSION, BICEPS TENODESIS;  Surgeon: [name], MD;  Location: [facility];  Service: Orthopedics;  Laterality: Left;    There were no vitals filed for this visit.   Subjective Assessment - 08/28/20 1020    Subjective The patient feels tight in the L anterior shoulder.  His R shoulder also hurts.  He notes it is hard to roll onto his side at night and wakes him up with discomfort.    Patient Stated Goals use the Lt UE as normal.  HIking , build things, and shoot a gun and landscaping    Currently in Pain? No/denies   "just tight"             [facility] PT Assessment - 08/28/20 1022      Assessment   Medical Diagnosis Lt RTC repair with bicep tendodesis and DCR    Referring Provider (PT) Dr Ramond Marrow    [place] Date/Surgical Date 05/31/20    Hand Dominance Right      AROM   Left Shoulder Flexion 124 Degrees   in standing                        [facility] Adult PT Treatment/Exercise - 08/28/20 1022      Exercises   Exercises Shoulder      Shoulder Exercises: Prone   Retraction Strengthening;Left;10 reps    Extension Strengthening;Left;10 reps      Shoulder Exercises: Sidelying   External Rotation AROM;Left;10 reps    ABduction PROM;Left;5 reps      Shoulder Exercises: Standing   Protraction AROM;Strengthening;Both;12 reps    Flexion AROM;Left;10 reps      Shoulder Exercises: ROM/Strengthening   UBE (Upper Arm Bike) L4 forward 2.5 minutes, backwards 1 minute    Rhythmic Stabilization, Supine x 1 minute multi-planar      Shoulder Exercises: Stretch   Wall Stretch - Flexion 5 reps      Manual Therapy   Manual Therapy Soft tissue mobilization;Joint mobilization;Passive ROM    Manual therapy comments supine, prone, and sidelying for ROM    Joint Mobilization grade II inferior glide GH, AP mobs grade II AC joint; gentle manual distraction  Soft tissue mobilization STM L upper trap, L parasapular musculature and deltoids    Scapular Mobilization sidelying L scapula elvation/depression and scapular protraction/retraction    Passive ROM L shoulder flexion, abduction ER            Trigger Point Dry Needling - 08/28/20 1042    Consent Given? Yes    Education Handout Provided Yes    Muscles Treated Head and Neck Upper trapezius;Levator scapulae    Muscles Treated Upper Quadrant Deltoid    Upper Trapezius Response Twitch reponse elicited;Palpable increased muscle length    Levator Scapulae Response Twitch response elicited;Palpable increased muscle length    Deltoid Response Palpable increased muscle length                  PT Short Term Goals - 08/02/20 1232      PT SHORT TERM GOAL #1   Title I with initial HEP and understanding of protocol     Time 4    Period Weeks    Status Achieved    Target Date 07/25/20      PT SHORT TERM GOAL #2   Title demo Lt shoulder PROM per protocol    Time 4    Period Weeks    Status Achieved    Target Date 07/25/20      PT SHORT TERM GOAL #3   Title tolerate AAROM Lt shoulder with no more than 4/10 pain    Time 4    Period Weeks    Status Achieved    Target Date 07/25/20      PT SHORT TERM GOAL #4   Title i with self dressing and grooming    Time 4    Period Weeks    Status Achieved    Target Date 07/25/20             PT Long Term Goals - 08/23/20 1032      PT LONG TERM GOAL #1   Title independent with advanced HEP    Time 6    Status On-going    Target Date 10/03/20      PT LONG TERM GOAL #2   Title demo Lt shoulder strength =/> 4/5 to allow initial return to recreational activities    Time 6    Period Weeks    Status On-going    Target Date 10/03/20      PT LONG TERM GOAL #3   Title improve FOTO =/better than 65    Time 6    Period Weeks    Status On-going    Target Date 10/03/20      PT LONG TERM GOAL #4   Title demo Lt shoulder and cervical ROM WFL    Time 6    Period Weeks    Status On-going    Target Date 10/03/20      PT LONG TERM GOAL #5   Title tolerate some light landscaping with no more than 3/10Lt shoudler pain    Time 6    Period Weeks    Status On-going    Target Date 10/03/20                 Plan - 08/28/20 1609    Clinical Impression Statement The patient continues with scapular elevation during overhead motion.  He has dec'd pain overall and is limited in end range flexion and ER.  PT progressed ROM in conjunction with DN and STM today to gain increased motion after STM from  124 up to 132 in standing.  PT to continue parascapular strengthening and pushing end range motion.    PT Treatment/Interventions Iontophoresis 4mg /ml Dexamethasone;Taping;Vasopneumatic Device;Patient/family education;Moist Heat;Therapeutic activities;Passive  range of motion;Dry needling;Therapeutic exercise;Cryotherapy;Electrical Stimulation;Neuromuscular re-education;Manual techniques    PT Next Visit Plan Continue shoulder rehab per provided protocol progressing end range mobility/ROM; strength; function Lt UE: work on end range passively    PT Home Exercise Plan [id number]    Consulted and Agree with Plan of Care Patient           Patient will benefit from skilled therapeutic intervention in order to improve the following deficits and impairments:     Visit Diagnosis: Stiffness of left shoulder, not elsewhere classified  Acute pain of left shoulder  Muscle weakness (generalized)  Localized edema     Problem List Patient Active Problem List   Diagnosis Date Noted  . 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 Primary osteoarthritis involving multiple joints 07/26/2018  . Acute left ankle pain 07/26/2018  . Cerumen debris on tympanic membrane of right ear 07/26/2018  . Tinea pedis 07/21/2018  . Periscapular pain 07/20/2018  . Retrocalcaneal bursitis, left 07/20/2018  . Hepatitis B core antibody positive 07/20/2018  . Polyarthralgia 07/20/2018  . At risk for obstructive sleep apnea 07/20/2018  . Hearing loss due to cerumen impaction, right 11/02/2017  . Seasonal allergic rhinitis due to pollen 11/02/2017  . Fasting hyperglycemia 08/06/2017  . Primary osteoarthritis of right hip 08/06/2017  . Chronic right-sided low back pain without sciatica 08/06/2017  . Hypertriglyceridemia 08/06/2017  . 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  . Gastroesophageal reflux disease without esophagitis 07/09/2017  . Hypertension goal BP (blood pressure) < 130/80 07/09/2017  . Class 2 obesity due to excess calories in adult 07/09/2017    Althia Egolf, PT 08/28/2020, 4:10 PM  [facility] [address] [place], [place], [place] Phone: [phone]   Fax:   [phone]  Name: CHRISTAIN NIZNIK MRN: Frankey Poot Date of Birth: 1960-09-26

## 2020-08-30 ENCOUNTER — Ambulatory Visit (INDEPENDENT_AMBULATORY_CARE_PROVIDER_SITE_OTHER): Payer: BC Managed Care – PPO | Admitting: Rehabilitative and Restorative Service Providers"

## 2020-08-30 ENCOUNTER — Other Ambulatory Visit: Payer: Self-pay

## 2020-08-30 ENCOUNTER — Encounter: Payer: Self-pay | Admitting: Rehabilitative and Restorative Service Providers"

## 2020-08-30 DIAGNOSIS — M25512 Pain in left shoulder: Secondary | ICD-10-CM

## 2020-08-30 DIAGNOSIS — M25612 Stiffness of left shoulder, not elsewhere classified: Secondary | ICD-10-CM | POA: Diagnosis not present

## 2020-08-30 DIAGNOSIS — M6281 Muscle weakness (generalized): Secondary | ICD-10-CM

## 2020-08-30 NOTE — Therapy (Signed)
[facility] [place] [address] [place], [place], [address] Phone: [phone]   Fax:  [phone]  Physical Therapy Treatment  Patient Details  Name: Adrian [name] MRN: [id number] Date of Birth: 03/27/1961 Referring Provider (PT): Dr Ramond Marrow   Encounter Date: 08/30/2020   PT End of Session - 08/30/20 1015    Visit Number 17    Number of Visits 28    Date for PT Re-Evaluation 10/03/20    Authorization Type BCBS    PT Start Time 1014    PT Stop Time 1105   MH end of treatment   PT Time Calculation (min) 51 min    Activity Tolerance Patient tolerated treatment well           Past Medical History:  Diagnosis Date  . Chronic right hip pain 08/06/2017  . Chronic right-sided low back pain without sciatica 08/06/2017  . GERD (gastroesophageal reflux disease)   . Hypertension   . Hypertriglyceridemia 08/06/2017  . Seasonal allergies     Past Surgical History:  Procedure Laterality Date  . ESOPHAGOGASTRODUODENOSCOPY    . NO PAST SURGERIES    . SHOULDER ARTHROSCOPY WITH SUBACROMIAL DECOMPRESSION AND BICEP TENDON REPAIR Left 05/31/2020   Procedure: LEFT SHOULDER ARTHROSCOPY WITH DEBRIDEMENT, DISTAL CLAVICAL EXCISION,  SUBACROMIAL DECOMPRESSION, BICEPS TENODESIS;  Surgeon: [name], MD;  Location: [facility];  Service: Orthopedics;  Laterality: Left;    There were no vitals filed for this visit.   Subjective Assessment - 08/30/20 1015    Subjective Patient reports that the Lt shoulder is doing better than the Rt. Patient has Rt shoulder surgery scheduled for 10/08/20. He did feel some muscle spasms in the Lt shoulder at the end of the day yesterday.    Currently in Pain? No/denies    Pain Score 0-No pain                             [facility] Adult PT Treatment/Exercise - 08/30/20 0001      Shoulder Exercises: Standing   Flexion Left;10 reps;Strengthening;Weights   focus on scapular control  lifting to ~ 70 deg   Shoulder Flexion Weight (lbs) 1    ABduction Strengthening;Left;10 reps;Weights   scaption focus on scapular control lifting to ~ 90 deg   Shoulder ABduction Weight (lbs) 1    Row Strengthening;Left;10 reps;Weights    Row Weight (lbs) 3      Shoulder Exercises: ROM/Strengthening   UBE (Upper Arm Bike) L4 forward x 4 minutes, 2 fwd/2 back      Shoulder Exercises: Stretch   Wall Stretch - Flexion 5 reps    Other Shoulder Stretches biceps stretch 30 sec x 2 at doorway      Moist Heat Therapy   Number Minutes Moist Heat 10 Minutes    Moist Heat Location Shoulder      Manual Therapy   Manual Therapy Soft tissue mobilization;Joint mobilization;Passive ROM    Manual therapy comments supine, prone    Joint Mobilization grade II inferior glide GH, AP mobs grade II AC joint; gentle manual distraction    Soft tissue mobilization STM L upper trap, L parasapular musculature and deltoids    Passive ROM L shoulder flexion, abduction ER    Other Manual Therapy skilled palpation to assess response to DN and manual work            Trigger [employer] Needling - 08/30/20 0001  Consent Given? Yes    Education Handout Provided Previously provided    Upper Trapezius Response Twitch reponse elicited;Palpable increased muscle length    Levator Scapulae Response Twitch response elicited;Palpable increased muscle length    Pectoralis Major Response Palpable increased muscle length;Twitch response elicited    Pectoralis Minor Response Palpable increased muscle length    Deltoid Response Palpable increased muscle length    Biceps Response Palpable increased muscle length;Twitch response elicited                  PT Short Term Goals - 08/02/20 1232      PT SHORT TERM GOAL #1   Title I with initial HEP and understanding of protocol    Time 4    Period Weeks    Status Achieved    Target Date 07/25/20      PT SHORT TERM GOAL #2   Title demo Lt shoulder PROM per  protocol    Time 4    Period Weeks    Status Achieved    Target Date 07/25/20      PT SHORT TERM GOAL #3   Title tolerate AAROM Lt shoulder with no more than 4/10 pain    Time 4    Period Weeks    Status Achieved    Target Date 07/25/20      PT SHORT TERM GOAL #4   Title i with self dressing and grooming    Time 4    Period Weeks    Status Achieved    Target Date 07/25/20             PT Long Term Goals - 08/23/20 1032      PT LONG TERM GOAL #1   Title independent with advanced HEP    Time 6    Status On-going    Target Date 10/03/20      PT LONG TERM GOAL #2   Title demo Lt shoulder strength =/> 4/5 to allow initial return to recreational activities    Time 6    Period Weeks    Status On-going    Target Date 10/03/20      PT LONG TERM GOAL #3   Title improve FOTO =/better than 65    Time 6    Period Weeks    Status On-going    Target Date 10/03/20      PT LONG TERM GOAL #4   Title demo Lt shoulder and cervical ROM WFL    Time 6    Period Weeks    Status On-going    Target Date 10/03/20      PT LONG TERM GOAL #5   Title tolerate some light landscaping with no more than 3/10Lt shoudler pain    Time 6    Period Weeks    Status On-going    Target Date 10/03/20                 Plan - 08/30/20 1103    Clinical Impression Statement Good response to DN at last visit with less tightness in the shoulder and upper back reported. Some spasm and tightness continues in the Lt UE.    Rehab Potential Good    PT Frequency 2x / week    PT Duration 6 weeks    PT Treatment/Interventions Iontophoresis 4mg /ml Dexamethasone;Taping;Vasopneumatic Device;Patient/family education;Moist Heat;Therapeutic activities;Passive range of motion;Dry needling;Therapeutic exercise;Cryotherapy;Electrical Stimulation;Neuromuscular re-education;Manual techniques    PT Next Visit Plan Continue shoulder rehab per provided protocol progressing end  range mobility/ROM; strength;  function Lt UE: work on end range passively; assess response to DN    PT Home Exercise Plan [id number]    Consulted and Agree with Plan of Care Patient           Patient will benefit from skilled therapeutic intervention in order to improve the following deficits and impairments:     Visit Diagnosis: Stiffness of left shoulder, not elsewhere classified  Acute pain of left shoulder  Muscle weakness (generalized)     Problem List Patient Active Problem List   Diagnosis Date Noted  . 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 Primary osteoarthritis involving multiple joints 07/26/2018  . Acute left ankle pain 07/26/2018  . Cerumen debris on tympanic membrane of right ear 07/26/2018  . Tinea pedis 07/21/2018  . Periscapular pain 07/20/2018  . Retrocalcaneal bursitis, left 07/20/2018  . Hepatitis B core antibody positive 07/20/2018  . Polyarthralgia 07/20/2018  . At risk for obstructive sleep apnea 07/20/2018  . Hearing loss due to cerumen impaction, right 11/02/2017  . Seasonal allergic rhinitis due to pollen 11/02/2017  . Fasting hyperglycemia 08/06/2017  . Primary osteoarthritis of right hip 08/06/2017  . Chronic right-sided low back pain without sciatica 08/06/2017  . Hypertriglyceridemia 08/06/2017  . 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  . Gastroesophageal reflux disease without esophagitis 07/09/2017  . Hypertension goal BP (blood pressure) < 130/80 07/09/2017  . Class 2 obesity due to excess calories in adult 07/09/2017    Micheal Sheen Rober Minion PT, MPH  08/30/2020, 11:16 AM  [facility] [address] [place], [place], [address] Phone: [phone]   Fax:  [phone]  Name: AMIN FORNWALT MRN: [id number] Date of Birth: Feb 10, 1961

## 2020-09-04 ENCOUNTER — Other Ambulatory Visit: Payer: Self-pay

## 2020-09-04 ENCOUNTER — Ambulatory Visit (INDEPENDENT_AMBULATORY_CARE_PROVIDER_SITE_OTHER): Payer: BC Managed Care – PPO | Admitting: Physical Therapy

## 2020-09-04 DIAGNOSIS — M25612 Stiffness of left shoulder, not elsewhere classified: Secondary | ICD-10-CM

## 2020-09-04 DIAGNOSIS — M25512 Pain in left shoulder: Secondary | ICD-10-CM

## 2020-09-04 DIAGNOSIS — M6281 Muscle weakness (generalized): Secondary | ICD-10-CM

## 2020-09-04 NOTE — Therapy (Signed)
[place], [place], [address] Phone: [phone]   Fax:  [phone]  Physical Therapy Treatment  Patient Details  Name: Adrian [name] MRN: [id number] Date of Birth: 01/17/1961 Referring Provider (PT): Dr Ophelia Charter   Encounter Date: 09/04/2020   PT End of Session - 09/04/20 1022    Visit Number 18    Number of Visits 28    Date for PT Re-Evaluation 10/03/20    Authorization Type BCBS    PT Start Time 1018    PT Stop Time 1100    PT Time Calculation (min) 42 min    Activity Tolerance Patient tolerated treatment well    Behavior During Therapy [facility] for tasks assessed/performed           Past Medical History:  Diagnosis Date  . Chronic right hip pain 08/06/2017  . Chronic right-sided low back pain without sciatica 08/06/2017  . GERD (gastroesophageal reflux disease)   . Hypertension   . Hypertriglyceridemia 08/06/2017  . Seasonal allergies     Past Surgical History:  Procedure Laterality Date  . ESOPHAGOGASTRODUODENOSCOPY    . NO PAST SURGERIES    . SHOULDER ARTHROSCOPY WITH SUBACROMIAL DECOMPRESSION AND BICEP TENDON REPAIR Left 05/31/2020   Procedure: LEFT SHOULDER ARTHROSCOPY WITH DEBRIDEMENT, DISTAL CLAVICAL EXCISION,  SUBACROMIAL DECOMPRESSION, BICEPS TENODESIS;  Surgeon: Hiram Gash, MD;  Location: [facility];  Service: Orthopedics;  Laterality: Left;    There were no vitals filed for this visit.   Subjective Assessment - 09/04/20 1022    Subjective "My Rt shoulder is miserable".  Pt reports he is not sleeping very well, due to waking due to pain from laying on either side.    Patient Stated Goals use the Lt UE as normal.  HIking , build things, and shoot a gun and landscaping    Currently in Pain? Yes    Pain Score 3     Pain Location Shoulder    Pain Orientation Right    Pain Descriptors / Indicators Sore              [facility] PT Assessment - 09/04/20 0001       Assessment   Medical Diagnosis Lt RTC repair with bicep tendodesis and DCR    Referring Provider (PT) Dr Ophelia Charter    [facility] Date/Surgical Date 05/31/20    Hand Dominance Right      AROM   AROM Assessment Site Cervical    Left Shoulder Extension 55 Degrees    Left Shoulder Flexion 140 Degrees   standing   Left Shoulder ABduction 132 Degrees    Left Shoulder Internal Rotation --   thumb to L1   Left Shoulder External Rotation 86 Degrees   standing   Cervical Flexion 50    Cervical Extension 45    Cervical - Right Side Bend 35    Cervical - Left Side Bend 35    Cervical - Right Rotation 45    Cervical - Left Rotation 62            [facility] Adult PT Treatment/Exercise - 09/04/20 0001      Elbow Exercises   Elbow Flexion Strengthening;Left;10 reps;Seated   4#, 2 sets     Shoulder Exercises: Seated   Row 10 reps;Left;Strengthening   2 sets   Theraband Level (Shoulder Row) Level 3 (Green)      Shoulder Exercises: Standing   Internal Rotation Limitations back of Lt hand to  belt line, lifting hand off of back x 8 reps    Flexion Left;10 reps;Strengthening;Weights   focus on scapular control lifting to ~ 85 deg   Shoulder Flexion Weight (lbs) 2   and 5 reps with 4#     Shoulder Exercises: ROM/Strengthening   UBE (Upper Arm Bike) L3: 1 min forward/ 1 min back, then 30 sec forward/ backward. One episode of catching in Rt ant shoulder.      Shoulder Exercises: Stretch   Corner Stretch 20 seconds;3 reps    Wall Stretch - ABduction 2 reps;10 seconds    Table Stretch - Flexion 3 reps;20 seconds   arms on railing   Other Shoulder Stretches Lt bicep stretch holding counter x 15 sec x 3 reps;  Lt thoracic rotation with open book in Rt sidelying x 5 reps                   PT Short Term Goals - 08/02/20 1232      PT SHORT TERM GOAL #1   Title I with initial HEP and understanding of protocol    Time 4    Period Weeks    Status Achieved    Target Date 07/25/20      PT  SHORT TERM GOAL #2   Title demo Lt shoulder PROM per protocol    Time 4    Period Weeks    Status Achieved    Target Date 07/25/20      PT SHORT TERM GOAL #3   Title tolerate AAROM Lt shoulder with no more than 4/10 pain    Time 4    Period Weeks    Status Achieved    Target Date 07/25/20      PT SHORT TERM GOAL #4   Title i with self dressing and grooming    Time 4    Period Weeks    Status Achieved    Target Date 07/25/20             PT Long Term Goals - 09/04/20 1101      PT LONG TERM GOAL #1   Title independent with advanced HEP    Time 6    Status On-going      PT LONG TERM GOAL #2   Title demo Lt shoulder strength =/> 4/5 to allow initial return to recreational activities    Time 6    Period Weeks    Status On-going      PT LONG TERM GOAL #3   Title improve FOTO =/better than 65    Time 6    Period Weeks    Status On-going      PT LONG TERM GOAL #4   Title demo Lt shoulder and cervical ROM WFL    Time 6    Period Weeks    Status Partially Met      PT LONG TERM GOAL #5   Title tolerate some light landscaping with no more than 3/10Lt shoudler pain    Time 6    Period Weeks    Status Partially Met                 Plan - 09/04/20 1101    Clinical Impression Statement Pt demonstrates some tightness in cervical lateral flexion and Rt rotation; partially met his ROM goal.  Pt's Lt shoulder AROM improving.  Tolerating increased resistance with LUE(within protocol parameters) without increased pain. Progressing well towards goals.    Rehab Potential Good  PT Frequency 2x / week    PT Duration 6 weeks    PT Treatment/Interventions Iontophoresis 54m/ml Dexamethasone;Taping;Vasopneumatic Device;Patient/family education;Moist Heat;Therapeutic activities;Passive range of motion;Dry needling;Therapeutic exercise;Cryotherapy;Electrical Stimulation;Neuromuscular re-education;Manual techniques    PT Next Visit Plan Continue shoulder rehab per provided  protocol progressing end range mobility/ROM; strength; function Lt UE: work on end range passively    PT [place] Agree with Plan of Care Patient           Patient will benefit from skilled therapeutic intervention in order to improve the following deficits and impairments:  Decreased range of motion,Impaired UE functional use,Increased muscle spasms,Pain,Postural dysfunction,Decreased strength  Visit Diagnosis: Stiffness of left shoulder, not elsewhere classified  Acute pain of left shoulder  Muscle weakness (generalized)     Problem List Patient Active Problem List   Diagnosis Date Noted  . 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 Primary osteoarthritis involving multiple joints 07/26/2018  . Acute left ankle pain 07/26/2018  . Cerumen debris on tympanic membrane of right ear 07/26/2018  . Tinea pedis 07/21/2018  . Periscapular pain 07/20/2018  . Retrocalcaneal bursitis, left 07/20/2018  . Hepatitis B core antibody positive 07/20/2018  . Polyarthralgia 07/20/2018  . At risk for obstructive sleep apnea 07/20/2018  . Hearing loss due to cerumen impaction, right 11/02/2017  . Seasonal allergic rhinitis due to pollen 11/02/2017  . Fasting hyperglycemia 08/06/2017  . Primary osteoarthritis of right hip 08/06/2017  . Chronic right-sided low back pain without sciatica 08/06/2017  . Hypertriglyceridemia 08/06/2017  . 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  . Gastroesophageal reflux disease without esophagitis 07/09/2017  . Hypertension goal BP (blood pressure) < 130/80 07/09/2017  . Class 2 obesity due to excess calories in adult 07/09/2017   JKerin Perna PTA 09/04/20 12:55 PM  [place] [address]Phone: [phone]  Fax:  [phone] Name: Adrian LYCANMRN: [id number] of Birth: 906-23-1962

## 2020-09-06 ENCOUNTER — Other Ambulatory Visit: Payer: Self-pay

## 2020-09-06 ENCOUNTER — Encounter: Payer: Self-pay | Admitting: Physical Therapy

## 2020-09-06 ENCOUNTER — Ambulatory Visit (INDEPENDENT_AMBULATORY_CARE_PROVIDER_SITE_OTHER): Payer: BC Managed Care – PPO | Admitting: Physical Therapy

## 2020-09-06 DIAGNOSIS — M25612 Stiffness of left shoulder, not elsewhere classified: Secondary | ICD-10-CM

## 2020-09-06 DIAGNOSIS — M25512 Pain in left shoulder: Secondary | ICD-10-CM

## 2020-09-06 DIAGNOSIS — M6281 Muscle weakness (generalized): Secondary | ICD-10-CM

## 2020-09-06 NOTE — Therapy (Signed)
[place], [place], [address] Phone: [phone]   Fax:  [phone]  Physical Therapy Treatment  Patient Details  Name: Adrian [name] MRN: [id number] Date of Birth: August 03, 1960 Referring Provider (PT): Dr Ophelia Charter   Encounter Date: 09/06/2020   PT End of Session - 09/06/20 1056    Visit Number 19    Number of Visits 28    Date for PT Re-Evaluation 10/03/20    Authorization Type BCBS    PT Start Time 1018    PT Stop Time 1056    PT Time Calculation (min) 38 min    Activity Tolerance Patient tolerated treatment well    Behavior During Therapy [facility] for tasks assessed/performed           Past Medical History:  Diagnosis Date  . Chronic right hip pain 08/06/2017  . Chronic right-sided low back pain without sciatica 08/06/2017  . GERD (gastroesophageal reflux disease)   . Hypertension   . Hypertriglyceridemia 08/06/2017  . Seasonal allergies     Past Surgical History:  Procedure Laterality Date  . ESOPHAGOGASTRODUODENOSCOPY    . NO PAST SURGERIES    . SHOULDER ARTHROSCOPY WITH SUBACROMIAL DECOMPRESSION AND BICEP TENDON REPAIR Left 05/31/2020   Procedure: LEFT SHOULDER ARTHROSCOPY WITH DEBRIDEMENT, DISTAL CLAVICAL EXCISION,  SUBACROMIAL DECOMPRESSION, BICEPS TENODESIS;  Surgeon: Hiram Gash, MD;  Location: [facility];  Service: Orthopedics;  Laterality: Left;    There were no vitals filed for this visit.   Subjective Assessment - 09/06/20 1026    Subjective Pt reports he did 6 hrs of yard work (spraying, [employer]) on day of last session.  Lt shoulder did well.    Patient Stated Goals use the Lt UE as normal.  HIking , build things, and shoot a gun and landscaping    Currently in Pain? No/denies    Pain Score 0-No pain              [facility] PT Assessment - 09/06/20 0001      Assessment   Medical Diagnosis Lt RTC repair with bicep tendodesis and DCR    Referring  Provider (PT) Dr Ophelia Charter    Onset Date/Surgical Date 05/31/20    Hand Dominance Right            [facility] Adult PT Treatment/Exercise - 09/06/20 0001      Elbow Exercises   Elbow Flexion Strengthening;Left;10 reps;Standing   4#, 2 sets     Shoulder Exercises: Prone   Other Prone Exercises childs pose x 15 sec x 2 reps    Other Prone Exercises quadruped - single arm row with 4# in hand x 10 R/L.      Shoulder Exercises: Sidelying   Other Sidelying Exercises Open book with hand behind head x 10 reps to Lt, 5 reps on Rt      Shoulder Exercises: Standing   Flexion Left;5 reps   one set of 5 with each wt.   Shoulder Flexion Weight (lbs) 4,3 - (1 set of 10 with 2#)   ABduction Strengthening;Left;10 reps;Weights   scaption focus on scapular control lifting to ~ 90 deg   Shoulder ABduction Weight (lbs) 2      Shoulder Exercises: ROM/Strengthening   UBE (Upper Arm Bike) L1: with bilat arms, then used LUE only after 1.5 min due to Rt shoulder pain.  (4 min total)      Shoulder Exercises: Stretch   Wall Stretch -  Flexion 2 reps;20 seconds    Wall Stretch - ABduction 3 reps;20 seconds    Other Shoulder Stretches Lt/Rt bicep stretch x 20 sec each x 3      Manual Therapy   Passive ROM Lt shoulder flexion, scaption, and ER                    PT Short Term Goals - 08/02/20 1232      PT SHORT TERM GOAL #1   Title I with initial HEP and understanding of protocol    Time 4    Period Weeks    Status Achieved    Target Date 07/25/20      PT SHORT TERM GOAL #2   Title demo Lt shoulder PROM per protocol    Time 4    Period Weeks    Status Achieved    Target Date 07/25/20      PT SHORT TERM GOAL #3   Title tolerate AAROM Lt shoulder with no more than 4/10 pain    Time 4    Period Weeks    Status Achieved    Target Date 07/25/20      PT SHORT TERM GOAL #4   Title i with self dressing and grooming    Time 4    Period Weeks    Status Achieved    Target Date 07/25/20              PT Long Term Goals - 09/04/20 1101      PT LONG TERM GOAL #1   Title independent with advanced HEP    Time 6    Status On-going      PT LONG TERM GOAL #2   Title demo Lt shoulder strength =/> 4/5 to allow initial return to recreational activities    Time 6    Period Weeks    Status On-going      PT LONG TERM GOAL #3   Title improve FOTO =/better than 65    Time 6    Period Weeks    Status On-going      PT LONG TERM GOAL #4   Title demo Lt shoulder and cervical ROM WFL    Time 6    Period Weeks    Status Partially Met      PT LONG TERM GOAL #5   Title tolerate some light landscaping with no more than 3/10Lt shoudler pain    Time 6    Period Weeks    Status Partially Met                 Plan - 09/06/20 1054    Clinical Impression Statement Continued work on end range passive/ AAROM for Lt shoulder.  Lt shoulder strength gradually improving.  Pt tolerated all exercises without pain, just fatigue in Lt shoulder.    Rehab Potential Good    PT Frequency 2x / week    PT Duration 6 weeks    PT Treatment/Interventions Iontophoresis 11m/ml Dexamethasone;Taping;Vasopneumatic Device;Patient/family education;Moist Heat;Therapeutic activities;Passive range of motion;Dry needling;Therapeutic exercise;Cryotherapy;Electrical Stimulation;Neuromuscular re-education;Manual techniques    PT Next Visit Plan Continue shoulder rehab per provided protocol progressing end range mobility/ROM; strength; function Lt UE: work on end range passively    [place] Agree with Plan of Care Patient           Patient will benefit from skilled therapeutic intervention in order to improve the following deficits and  impairments:  Decreased range of motion,Impaired UE functional use,Increased muscle spasms,Pain,Postural dysfunction,Decreased strength  Visit Diagnosis: Stiffness of left shoulder, not elsewhere classified  Acute pain of left  shoulder  Muscle weakness (generalized)     Problem List Patient Active Problem List   Diagnosis Date Noted  . 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 Primary osteoarthritis involving multiple joints 07/26/2018  . Acute left ankle pain 07/26/2018  . Cerumen debris on tympanic membrane of right ear 07/26/2018  . Tinea pedis 07/21/2018  . Periscapular pain 07/20/2018  . Retrocalcaneal bursitis, left 07/20/2018  . Hepatitis B core antibody positive 07/20/2018  . Polyarthralgia 07/20/2018  . At risk for obstructive sleep apnea 07/20/2018  . Hearing loss due to cerumen impaction, right 11/02/2017  . Seasonal allergic rhinitis due to pollen 11/02/2017  . Fasting hyperglycemia 08/06/2017  . Primary osteoarthritis of right hip 08/06/2017  . Chronic right-sided low back pain without sciatica 08/06/2017  . Hypertriglyceridemia 08/06/2017  . 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  . Gastroesophageal reflux disease without esophagitis 07/09/2017  . Hypertension goal BP (blood pressure) < 130/80 07/09/2017  . Class 2 obesity due to excess calories in adult 07/09/2017   Kerin Perna, PTA 09/06/20 10:59 AM  [facility] [place], [place], [address] Phone: [phone]   Fax:  [phone]  Name: Adrian [name] MRN: [id number] Date of Birth: May 17, 1961

## 2020-09-13 ENCOUNTER — Ambulatory Visit (INDEPENDENT_AMBULATORY_CARE_PROVIDER_SITE_OTHER): Payer: BC Managed Care – PPO | Admitting: Physical Therapy

## 2020-09-13 ENCOUNTER — Other Ambulatory Visit: Payer: Self-pay

## 2020-09-13 ENCOUNTER — Encounter: Payer: Self-pay | Admitting: Physical Therapy

## 2020-09-13 DIAGNOSIS — M25612 Stiffness of left shoulder, not elsewhere classified: Secondary | ICD-10-CM

## 2020-09-13 DIAGNOSIS — M6281 Muscle weakness (generalized): Secondary | ICD-10-CM

## 2020-09-13 DIAGNOSIS — M25512 Pain in left shoulder: Secondary | ICD-10-CM | POA: Diagnosis not present

## 2020-09-13 NOTE — Therapy (Signed)
[place], [place], [address] Phone: [phone]   Fax:  [phone]  Physical Therapy Treatment  Patient Details  Name: Adrian [name] MRN: [id number] Date of Birth: 17-May-1961 Referring Provider (PT): Dr Ophelia Charter   Encounter Date: 09/13/2020   PT End of Session - 09/13/20 1052    Visit Number 20    Number of Visits 28    Date for PT Re-Evaluation 10/03/20    Authorization Type BCBS    PT Start Time 1012    PT Stop Time 1052    PT Time Calculation (min) 40 min    Activity Tolerance Patient tolerated treatment well    Behavior During Therapy [facility] for tasks assessed/performed           Past Medical History:  Diagnosis Date  . Chronic right hip pain 08/06/2017  . Chronic right-sided low back pain without sciatica 08/06/2017  . GERD (gastroesophageal reflux disease)   . Hypertension   . Hypertriglyceridemia 08/06/2017  . Seasonal allergies     Past Surgical History:  Procedure Laterality Date  . ESOPHAGOGASTRODUODENOSCOPY    . NO PAST SURGERIES    . SHOULDER ARTHROSCOPY WITH SUBACROMIAL DECOMPRESSION AND BICEP TENDON REPAIR Left 05/31/2020   Procedure: LEFT SHOULDER ARTHROSCOPY WITH DEBRIDEMENT, DISTAL CLAVICAL EXCISION,  SUBACROMIAL DECOMPRESSION, BICEPS TENODESIS;  Surgeon: Hiram Gash, MD;  Location: [facility];  Service: Orthopedics;  Laterality: Left;    There were no vitals filed for this visit.   Subjective Assessment - 09/13/20 1014    Subjective Pt reports he is anxious to return to garden (raking, weeding, etc).  "I think its (Lt shoulder) stronger than the Rt."    Patient Stated Goals use the Lt UE as normal.  HIking , build things, and shoot a gun and landscaping    Currently in Pain? No/denies    Pain Score 0-No pain              [facility] PT Assessment - 09/13/20 0001      Assessment   Medical Diagnosis Lt RTC repair with bicep tendodesis and DCR     Referring Provider (PT) Dr Ophelia Charter    Onset Date/Surgical Date 05/31/20    Hand Dominance Right      ROM / Strength   AROM / PROM / Strength Strength      AROM   Left Shoulder ABduction 132 Degrees      Strength   Strength Assessment Site Shoulder    Right/Left Shoulder Left    Left Shoulder Flexion 3+/5    Left Shoulder Extension 5/5    Left Shoulder ABduction 3+/5    Left Shoulder Internal Rotation 4/5    Left Shoulder External Rotation 4/5            [facility] Adult PT Treatment/Exercise - 09/13/20 0001      Elbow Exercises   Elbow Flexion Strengthening;Left;10 reps   5#, 2 sets     Shoulder Exercises: Sidelying   Other Sidelying Exercises Open book with hand behind head/ arm straight x 5 reps    Other Sidelying Exercises Lt arm sleeper stretch IR/ ER x 20 sec x 2 reps each direction.      Shoulder Exercises: Standing   Flexion Left;10 reps   to 70 deg of flexion   Shoulder Flexion Weight (lbs) 3    Flexion Limitations 2 sets    ABduction Strengthening;Left;10 reps;Weights   scaption focus on scapular  control lifting to ~ 80 deg   Shoulder ABduction Weight (lbs) 3    ABduction Limitations 2 sets      Shoulder Exercises: ROM/Strengthening   Nustep L4: arms only x 5 min      Shoulder Exercises: Stretch   Wall Stretch - ABduction 1 rep;20 seconds    Table Stretch - Abduction 1 rep;30 seconds    Other Shoulder Stretches Lt/Rt bicep stretch x 20 sec each x 3    Other Shoulder Stretches middle doorway stretch x 15-20 sec, 3 reps.  Sidelying stretch with hand resting on head (moving into passive abdct) x 60 sec.      Manual Therapy   Passive ROM Lt shoulder flexion, scaption, and ER                    PT Short Term Goals - 08/02/20 1232      PT SHORT TERM GOAL #1   Title I with initial HEP and understanding of protocol    Time 4    Period Weeks    Status Achieved    Target Date 07/25/20      PT SHORT TERM GOAL #2   Title demo Lt shoulder PROM per  protocol    Time 4    Period Weeks    Status Achieved    Target Date 07/25/20      PT SHORT TERM GOAL #3   Title tolerate AAROM Lt shoulder with no more than 4/10 pain    Time 4    Period Weeks    Status Achieved    Target Date 07/25/20      PT SHORT TERM GOAL #4   Title i with self dressing and grooming    Time 4    Period Weeks    Status Achieved    Target Date 07/25/20             PT Long Term Goals - 09/13/20 1047      PT LONG TERM GOAL #1   Title independent with advanced HEP    Time 6    Status On-going      PT LONG TERM GOAL #2   Title demo Lt shoulder strength =/> 4/5 to allow initial return to recreational activities    Time 6    Period Weeks    Status Partially Met      PT LONG TERM GOAL #3   Title improve FOTO =/better than 65    Time 6    Period Weeks    Status On-going      PT LONG TERM GOAL #4   Title demo Lt shoulder and cervical ROM WFL    Time 6    Period Weeks    Status Partially Met      PT LONG TERM GOAL #5   Title tolerate some light landscaping with no more than 3/10 Lt shoudler pain    Time 6    Period Weeks    Status Partially Met                 Plan - 09/13/20 1048    Clinical Impression Statement Pt has tightness in end range Lt shoulder ER and abdct; time spent stretching in these ranges.  Pt tolerated 3# in flexion/ scaption to 70 deg with Lt shoulder.  Pt reported Lt shoulder fatigue, but no pain.  Making good gains towards remaining goals.  Rt shoulder surgery scheduled for 5/16.    Rehab Potential  Good    PT Frequency 2x / week    PT Duration 6 weeks    PT Treatment/Interventions Iontophoresis 59m/ml Dexamethasone;Taping;Vasopneumatic Device;Patient/family education;Moist Heat;Therapeutic activities;Passive range of motion;Dry needling;Therapeutic exercise;Cryotherapy;Electrical Stimulation;Neuromuscular re-education;Manual techniques    PT Next Visit Plan Continue shoulder rehab per provided protocol progressing  end range mobility/ROM; strength; function Lt UE: work on end range passively    PT HLiber[place] Centerand Agree with Plan of Care Patient           Patient will benefit from skilled therapeutic intervention in order to improve the following deficits and impairments:  Decreased range of motion,Impaired UE functional use,Increased muscle spasms,Pain,Postural dysfunction,Decreased strength  Visit Diagnosis: Stiffness of left shoulder, not elsewhere classified  Acute pain of left shoulder  Muscle weakness (generalized)     Problem List Patient Active Problem List   Diagnosis Date Noted  . 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 Primary osteoarthritis involving multiple joints 07/26/2018  . Acute left ankle pain 07/26/2018  . Cerumen debris on tympanic membrane of right ear 07/26/2018  . Tinea pedis 07/21/2018  . Periscapular pain 07/20/2018  . Retrocalcaneal bursitis, left 07/20/2018  . Hepatitis B core antibody positive 07/20/2018  . Polyarthralgia 07/20/2018  . At risk for obstructive sleep apnea 07/20/2018  . Hearing loss due to cerumen impaction, right 11/02/2017  . Seasonal allergic rhinitis due to pollen 11/02/2017  . Fasting hyperglycemia 08/06/2017  . Primary osteoarthritis of right hip 08/06/2017  . Chronic right-sided low back pain without sciatica 08/06/2017  . Hypertriglyceridemia 08/06/2017  . 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  . Gastroesophageal reflux disease without esophagitis 07/09/2017  . Hypertension goal BP (blood pressure) < 130/80 07/09/2017  . Class 2 obesity due to excess calories in adult 07/09/2017   JKerin Perna PTA 09/13/20 10:59 AM  [place] [address]Phone: [phone]  Fax:  [phone] Name: Adrian CHOYCEMRN: [id number] of Birth: 9Feb 06, 1962

## 2020-09-18 ENCOUNTER — Ambulatory Visit (INDEPENDENT_AMBULATORY_CARE_PROVIDER_SITE_OTHER): Payer: BC Managed Care – PPO | Admitting: Physical Therapy

## 2020-09-18 ENCOUNTER — Other Ambulatory Visit: Payer: Self-pay

## 2020-09-18 ENCOUNTER — Encounter: Payer: Self-pay | Admitting: Physical Therapy

## 2020-09-18 DIAGNOSIS — M25612 Stiffness of left shoulder, not elsewhere classified: Secondary | ICD-10-CM

## 2020-09-18 DIAGNOSIS — M6281 Muscle weakness (generalized): Secondary | ICD-10-CM

## 2020-09-18 NOTE — Therapy (Signed)
[place], [place], [address] Phone: [phone]   Fax:  [phone]  Physical Therapy Treatment  Patient Details  Name: Adrian [name] MRN: [id number] Date of Birth: 10-11-1960 Referring Provider (PT): Dr Ophelia Charter   Encounter Date: 09/18/2020   PT End of Session - 09/18/20 1018    Visit Number 21    Number of Visits 28    Date for PT Re-Evaluation 10/03/20    Authorization Type BCBS    PT Start Time 1008    PT Stop Time 1055    PT Time Calculation (min) 47 min    Activity Tolerance Patient tolerated treatment well    Behavior During Therapy [facility] for tasks assessed/performed           Past Medical History:  Diagnosis Date  . Chronic right hip pain 08/06/2017  . Chronic right-sided low back pain without sciatica 08/06/2017  . GERD (gastroesophageal reflux disease)   . Hypertension   . Hypertriglyceridemia 08/06/2017  . Seasonal allergies     Past Surgical History:  Procedure Laterality Date  . ESOPHAGOGASTRODUODENOSCOPY    . NO PAST SURGERIES    . SHOULDER ARTHROSCOPY WITH SUBACROMIAL DECOMPRESSION AND BICEP TENDON REPAIR Left 05/31/2020   Procedure: LEFT SHOULDER ARTHROSCOPY WITH DEBRIDEMENT, DISTAL CLAVICAL EXCISION,  SUBACROMIAL DECOMPRESSION, BICEPS TENODESIS;  Surgeon: Hiram Gash, MD;  Location: [facility];  Service: Orthopedics;  Laterality: Left;    There were no vitals filed for this visit.   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  He had soreness after last session, "but not bad".  He is able to lay on Lt side with less discomfort.    Patient Stated Goals use the Lt UE as normal.  HIking , build things, and shoot a gun and landscaping    Currently in Pain? No/denies    Pain Score 0-No pain              [facility] PT Assessment - 09/18/20 0001      Assessment   Medical  Diagnosis Lt RTC repair with bicep tendodesis and DCR    Referring Provider (PT) Dr Ophelia Charter    Onset Date/Surgical Date 05/31/20    Hand Dominance Right      Observation/Other Assessments   Focus on Therapeutic Outcomes (FOTO)  FS 72%            [facility] Adult PT Treatment/Exercise - 09/18/20 0001      Elbow Exercises   Elbow Flexion Strengthening;Left   5#     Lumbar Exercises: Quadruped   Other Quadruped Lumbar Exercises childs pose with Rt arm tucked under for deep stretch to Lt shoulder x 30 sec    Other Quadruped Lumbar Exercises weight shifts into Lt shoulder x 10 sec x 5 reps      Shoulder Exercises: Supine   Flexion Strengthening;Left;10 reps   end range for stretch, then returning to neutral.   Shoulder Flexion Weight (lbs) 2      Shoulder Exercises: Sidelying   External Rotation Strengthening;Left;10 reps    External Rotation Weight (lbs) 3    Other Sidelying Exercises Open book with hand behind head/ arm straight x 5 reps      Shoulder Exercises: Standing   Flexion Left;10 reps   2 sets   Shoulder Flexion Weight (lbs) 4    ABduction Strengthening;Left;10  reps;Weights   scaption focus on scapular control lifting to ~ 80 deg   Shoulder ABduction Weight (lbs) 3,    Row Strengthening;Left;10 reps   bent over row, 2 sets   Row Weight (lbs) 5    Other Standing Exercises upright row with 3# LUE only x 10 (cues for scap depression and form of arm)      Shoulder Exercises: ROM/Strengthening   Nustep L5: arms only x 5 min    Wall Pushups 10 reps      Shoulder Exercises: Stretch   Internal Rotation Stretch 1 rep   bilat hands behind back   Internal Rotation Stretch Limitations 2 more reps with LUE only.    Other Shoulder Stretches Lt tricep stretch x 20 sec.    Other Shoulder Stretches sleeper stretch for Lt shoulder: IR/ER x 10 sec x 3 reps each      Shoulder Exercises: Body Blade   Flexion 15 seconds   ~60 deg   ABduction 15 seconds   scaption, ~60 deg   External  Rotation 30 seconds                    PT Short Term Goals - 08/02/20 1232      PT SHORT TERM GOAL #1   Title I with initial HEP and understanding of protocol    Time 4    Period Weeks    Status Achieved    Target Date 07/25/20      PT SHORT TERM GOAL #2   Title demo Lt shoulder PROM per protocol    Time 4    Period Weeks    Status Achieved    Target Date 07/25/20      PT SHORT TERM GOAL #3   Title tolerate AAROM Lt shoulder with no more than 4/10 pain    Time 4    Period Weeks    Status Achieved    Target Date 07/25/20      PT SHORT TERM GOAL #4   Title i with self dressing and grooming    Time 4    Period Weeks    Status Achieved    Target Date 07/25/20             PT Long Term Goals - 09/18/20 1103      PT LONG TERM GOAL #1   Title independent with advanced HEP    Time 6    Status On-going      PT LONG TERM GOAL #2   Title demo Lt shoulder strength =/> 4/5 to allow initial return to recreational activities    Time 6    Period Weeks    Status Partially Met      PT LONG TERM GOAL #3   Title improve FOTO =/better than 65    Time 6    Period Weeks    Status Achieved      PT LONG TERM GOAL #4   Title demo Lt shoulder and cervical ROM WFL    Time 6    Period Weeks    Status Partially Met      PT LONG TERM GOAL #5   Title tolerate some light landscaping with no more than 3/10 Lt shoudler pain    Time 6    Period Weeks    Status Partially Met                 Plan - 09/18/20 1027    Clinical Impression Statement  FOTO scored improved to 72%; has met FOTO goal.  Pt tolerating gradual increase in resistance for LUE (within protocol restrictions - up to 5#) without pain, just fatigue.  Pt making great progress towards remaining goals.    Rehab Potential Good    PT Frequency 2x / week    PT Duration 6 weeks    PT Treatment/Interventions Iontophoresis 40m/ml Dexamethasone;Taping;Vasopneumatic Device;Patient/family education;Moist  Heat;Therapeutic activities;Passive range of motion;Dry needling;Therapeutic exercise;Cryotherapy;Electrical Stimulation;Neuromuscular re-education;Manual techniques    PT Next Visit Plan Continue shoulder rehab per provided protocol progressing end range mobility/ROM; strength; function Lt UE: work on end range passively    PT HP[place]and Agree with Plan of Care Patient           Patient will benefit from skilled therapeutic intervention in order to improve the following deficits and impairments:  Decreased range of motion,Impaired UE functional use,Increased muscle spasms,Pain,Postural dysfunction,Decreased strength  Visit Diagnosis: Stiffness of left shoulder, not elsewhere classified  Muscle weakness (generalized)     Problem List Patient Active Problem List   Diagnosis Date Noted  . 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 Primary osteoarthritis involving multiple joints 07/26/2018  . Acute left ankle pain 07/26/2018  . Cerumen debris on tympanic membrane of right ear 07/26/2018  . Tinea pedis 07/21/2018  . Periscapular pain 07/20/2018  . Retrocalcaneal bursitis, left 07/20/2018  . Hepatitis B core antibody positive 07/20/2018  . Polyarthralgia 07/20/2018  . At risk for obstructive sleep apnea 07/20/2018  . Hearing loss due to cerumen impaction, right 11/02/2017  . Seasonal allergic rhinitis due to pollen 11/02/2017  . Fasting hyperglycemia 08/06/2017  . Primary osteoarthritis of right hip 08/06/2017  . Chronic right-sided low back pain without sciatica 08/06/2017  . Hypertriglyceridemia 08/06/2017  . 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  . Gastroesophageal reflux disease without esophagitis 07/09/2017  . Hypertension goal BP (blood pressure) < 130/80 07/09/2017  . Class 2 obesity due to excess calories in adult 07/09/2017   JKerin Perna PTA 09/18/20 11:03 AM  [place] [address]Phone: [phone]  Fax:  [phone] Name: Adrian RAMUSMRN: [id number] of Birth: 9November 30, 1962

## 2020-09-20 ENCOUNTER — Ambulatory Visit (INDEPENDENT_AMBULATORY_CARE_PROVIDER_SITE_OTHER): Payer: BC Managed Care – PPO | Admitting: Rehabilitative and Restorative Service Providers"

## 2020-09-20 ENCOUNTER — Encounter: Payer: Self-pay | Admitting: Rehabilitative and Restorative Service Providers"

## 2020-09-20 ENCOUNTER — Other Ambulatory Visit: Payer: Self-pay

## 2020-09-20 DIAGNOSIS — M25612 Stiffness of left shoulder, not elsewhere classified: Secondary | ICD-10-CM | POA: Diagnosis not present

## 2020-09-20 DIAGNOSIS — M25512 Pain in left shoulder: Secondary | ICD-10-CM

## 2020-09-20 DIAGNOSIS — M6281 Muscle weakness (generalized): Secondary | ICD-10-CM

## 2020-09-20 DIAGNOSIS — R600 Localized edema: Secondary | ICD-10-CM

## 2020-09-20 NOTE — Therapy (Signed)
[place], [place], [address] Phone: [phone]   Fax:  [phone]  Physical Therapy Treatment  Patient Details  Name: Adrian [name] MRN: [id number] Date of Birth: 1960/07/07 Referring Provider (PT): Dr Ophelia Charter   Encounter Date: 09/20/2020   PT End of Session - 09/20/20 1017    Visit Number 22    Number of Visits 28    Date for PT Re-Evaluation 10/03/20    PT Start Time 1017    PT Stop Time 1102    PT Time Calculation (min) 45 min    Activity Tolerance Patient tolerated treatment well           Past Medical History:  Diagnosis Date  . Chronic right hip pain 08/06/2017  . Chronic right-sided low back pain without sciatica 08/06/2017  . GERD (gastroesophageal reflux disease)   . Hypertension   . Hypertriglyceridemia 08/06/2017  . Seasonal allergies     Past Surgical History:  Procedure Laterality Date  . ESOPHAGOGASTRODUODENOSCOPY    . NO PAST SURGERIES    . SHOULDER ARTHROSCOPY WITH SUBACROMIAL DECOMPRESSION AND BICEP TENDON REPAIR Left 05/31/2020   Procedure: LEFT SHOULDER ARTHROSCOPY WITH DEBRIDEMENT, DISTAL CLAVICAL EXCISION,  SUBACROMIAL DECOMPRESSION, BICEPS TENODESIS;  Surgeon: Hiram Gash, MD;  Location: [facility];  Service: Orthopedics;  Laterality: Left;    There were no vitals filed for this visit.   Subjective Assessment - 09/20/20 1021    Subjective Some discomfort in Lt shoudler when sleeping on the Lt shoulder. Rt shoulder hurts more than the Lt. Surgery scheduled Rt shoulder 10/08/20.    Currently in Pain? No/denies    Pain Score 0-No pain                             [facility] Adult PT Treatment/Exercise - 09/20/20 0001      Shoulder Exercises: Standing   Flexion Limitations eccentric lowering Lt hand on wall stepping back pause 3 sec before touching hand to wall again x 5 reps with lowering x 5 reps    Extension  Strengthening;Both;15 reps;Theraband    Theraband Level (Shoulder Extension) Level 4 (Blue)    Row Strengthening;Both;15 reps;Theraband    Theraband Level (Shoulder Row) Level 4 (Blue)      Shoulder Exercises: Therapy Ball   Other Therapy Ball Exercises ER rolling coregous ball against dorsum of hand, small circles x 1-2 min x 2 reps      Shoulder Exercises: ROM/Strengthening   UBE (Upper Arm Bike) L5 x 4 min; 2 min fwd/2 min back    Wall Pushups 10 reps      Shoulder Exercises: Stretch   Internal Rotation Stretch 3 reps   with strap     Shoulder Exercises: Body Blade   Flexion 15 seconds;3 reps   ~60 deg repeated ~ 70 deg   ABduction 15 seconds;3 reps   scaption, ~60 deg   External Rotation 30 seconds      Manual Therapy   Manual therapy comments sidelying    Joint Mobilization GH circumduction    Soft tissue mobilization Lt periscapular area    Scapular Mobilization sidelying L scapula elvation/depression and scapular protraction/retraction    Passive ROM Lt shoulder extension and IR    Manual Traction traction through the long arm Lt UE 5-10 sec several reps during PROM/stretching  PT Education - 09/20/20 1100    Education Details HEP    Person(s) Educated Patient    Methods Explanation;Demonstration;Tactile cues;Verbal cues;Handout    Comprehension Verbalized understanding;Returned demonstration;Verbal cues required;Tactile cues required            PT Short Term Goals - 08/02/20 1232      PT SHORT TERM GOAL #1   Title I with initial HEP and understanding of protocol    Time 4    Period Weeks    Status Achieved    Target Date 07/25/20      PT SHORT TERM GOAL #2   Title demo Lt shoulder PROM per protocol    Time 4    Period Weeks    Status Achieved    Target Date 07/25/20      PT SHORT TERM GOAL #3   Title tolerate AAROM Lt shoulder with no more than 4/10 pain    Time 4    Period Weeks    Status Achieved    Target Date 07/25/20       PT SHORT TERM GOAL #4   Title i with self dressing and grooming    Time 4    Period Weeks    Status Achieved    Target Date 07/25/20             PT Long Term Goals - 09/18/20 1103      PT LONG TERM GOAL #1   Title independent with advanced HEP    Time 6    Status On-going      PT LONG TERM GOAL #2   Title demo Lt shoulder strength =/> 4/5 to allow initial return to recreational activities    Time 6    Period Weeks    Status Partially Met      PT LONG TERM GOAL #3   Title improve FOTO =/better than 65    Time 6    Period Weeks    Status Achieved      PT LONG TERM GOAL #4   Title demo Lt shoulder and cervical ROM WFL    Time 6    Period Weeks    Status Partially Met      PT LONG TERM GOAL #5   Title tolerate some light landscaping with no more than 3/10 Lt shoudler pain    Time 6    Period Weeks    Status Partially Met                 Plan - 09/20/20 1022    Clinical Impression Statement Continued gradual improvement with strength and functional activities Lt UE. Added eccentric lowering Lt shoulder flexion in standing hand on wall to work on strength in shoulder flexion and ER rolling ball on wall to strengthen into ER. Added blue TB for posterior shoulder girdle strengthening.    Rehab Potential Good    PT Frequency 2x / week    PT Duration 6 weeks    PT Treatment/Interventions Iontophoresis 73m/ml Dexamethasone;Taping;Vasopneumatic Device;Patient/family education;Moist Heat;Therapeutic activities;Passive range of motion;Dry needling;Therapeutic exercise;Cryotherapy;Electrical Stimulation;Neuromuscular re-education;Manual techniques    PT Next Visit Plan Continue shoulder rehab per provided protocol progressing end range mobility/ROM; strength; function Lt UE: work on end range passively    [place]and Agree with Plan of Care Patient           Patient will benefit from skilled therapeutic intervention in order  to improve the following  deficits and impairments:     Visit Diagnosis: Stiffness of left shoulder, not elsewhere classified  Muscle weakness (generalized)  Acute pain of left shoulder  Localized edema     Problem List Patient Active Problem List   Diagnosis Date Noted  . 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 Primary osteoarthritis involving multiple joints 07/26/2018  . Acute left ankle pain 07/26/2018  . Cerumen debris on tympanic membrane of right ear 07/26/2018  . Tinea pedis 07/21/2018  . Periscapular pain 07/20/2018  . Retrocalcaneal bursitis, left 07/20/2018  . Hepatitis B core antibody positive 07/20/2018  . Polyarthralgia 07/20/2018  . At risk for obstructive sleep apnea 07/20/2018  . Hearing loss due to cerumen impaction, right 11/02/2017  . Seasonal allergic rhinitis due to pollen 11/02/2017  . Fasting hyperglycemia 08/06/2017  . Primary osteoarthritis of right hip 08/06/2017  . Chronic right-sided low back pain without sciatica 08/06/2017  . Hypertriglyceridemia 08/06/2017  . 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  . Gastroesophageal reflux disease without esophagitis 07/09/2017  . Hypertension goal BP (blood pressure) < 130/80 07/09/2017  . Class 2 obesity due to excess calories in adult 07/09/2017    Jaleah Lefevre Nilda Simmer PT, MPH  09/20/2020, 11:01 AM  [facility] [place], [place], [address] Phone: [phone]   Fax:  [phone]  Name: Adrian [name] MRN: [id number] Date of Birth: 09/19/1960

## 2020-09-25 ENCOUNTER — Ambulatory Visit (INDEPENDENT_AMBULATORY_CARE_PROVIDER_SITE_OTHER): Payer: BC Managed Care – PPO | Admitting: Physical Therapy

## 2020-09-25 ENCOUNTER — Other Ambulatory Visit: Payer: Self-pay

## 2020-09-25 DIAGNOSIS — M25612 Stiffness of left shoulder, not elsewhere classified: Secondary | ICD-10-CM

## 2020-09-25 DIAGNOSIS — M25512 Pain in left shoulder: Secondary | ICD-10-CM | POA: Diagnosis not present

## 2020-09-25 DIAGNOSIS — M6281 Muscle weakness (generalized): Secondary | ICD-10-CM | POA: Diagnosis not present

## 2020-09-25 NOTE — Therapy (Signed)
[place], [place], [address] Phone: [phone]   Fax:  [phone]  Physical Therapy Treatment  Patient Details  Name: Adrian [name] MRN: [id number] Date of Birth: 17-May-1961 Referring Provider (PT): Dr Ophelia Charter   Encounter Date: 09/25/2020   PT End of Session - 09/25/20 1018    Visit Number 23    Number of Visits 28    Date for PT Re-Evaluation 10/03/20    Authorization Type BCBS    PT Start Time 1016    PT Stop Time 1056    PT Time Calculation (min) 40 min    Activity Tolerance Patient tolerated treatment well    Behavior During Therapy [facility] for tasks assessed/performed           Past Medical History:  Diagnosis Date  . Chronic right hip pain 08/06/2017  . Chronic right-sided low back pain without sciatica 08/06/2017  . GERD (gastroesophageal reflux disease)   . Hypertension   . Hypertriglyceridemia 08/06/2017  . Seasonal allergies     Past Surgical History:  Procedure Laterality Date  . ESOPHAGOGASTRODUODENOSCOPY    . NO PAST SURGERIES    . SHOULDER ARTHROSCOPY WITH SUBACROMIAL DECOMPRESSION AND BICEP TENDON REPAIR Left 05/31/2020   Procedure: LEFT SHOULDER ARTHROSCOPY WITH DEBRIDEMENT, DISTAL CLAVICAL EXCISION,  SUBACROMIAL DECOMPRESSION, BICEPS TENODESIS;  Surgeon: Hiram Gash, MD;  Location: [facility];  Service: Orthopedics;  Laterality: Left;    There were no vitals filed for this visit.   Subjective Assessment - 09/25/20 1018    Subjective "I've had 2 days when I woke up in pain in both arms, and spasms in Lt shoulder".   Pt complains of tightness on top of Lt shoulder.  He did some gardening (using hoe and small hand tools) without difficulty or pain. He did have to lift bags of compost with both arms.    Patient Stated Goals use the Lt UE as normal.  HIking , build things, and shoot a gun and landscaping    Pain Score 0-No pain              [facility] PT  Assessment - 09/25/20 0001      Assessment   Medical Diagnosis Lt RTC repair with bicep tendodesis and DCR    Referring Provider (PT) Dr Ophelia Charter    Onset Date/Surgical Date 05/31/20    Hand Dominance Right      PROM   Left Shoulder Flexion 148 Degrees            [facility] Adult PT Treatment/Exercise - 09/25/20 0001      Lumbar Exercises: Quadruped   Single Arm Raise Left;Right;10 reps - difficult WB through RUE (tremulous)     Shoulder Exercises: Supine   Flexion Strengthening;Left;5 reps   end range for stretch, then returning to neutral. 10 sec hold   Shoulder Flexion Weight (lbs) 2    Other Supine Exercises Lt shoulder abdct to 90 with LTR to Rt x 5     Shoulder Exercises: Sidelying   External Rotation Strengthening;Left;10 reps   2 sets   External Rotation Weight (lbs) 3    Other Sidelying Exercises Lt arm sleeper stretch IR/ ER x 20 sec x 2 reps each direction.      Shoulder Exercises: Standing   Flexion Left;10 reps    Shoulder Flexion Weight (lbs) 4,3   5 each   ABduction Strengthening;Left;Weights;5 reps   scaption focus on scapular control lifting  to ~ 80 deg   Shoulder ABduction Weight (lbs) 4,3   2 reps 4#, 5 reps 3#     Shoulder Exercises: ROM/Strengthening   Nustep L6: arms only x 3 min    Wall Pushups 10 reps      Shoulder Exercises: Stretch   Internal Rotation Stretch 3 reps   with hand resting on waistband, elbow forward   Other Shoulder Stretches Lt pec stretch mid-level x 20 sec x 2      Shoulder Exercises: Body Blade   Flexion 15 seconds;3 reps   ~60 deg repeated ~ 70 deg   ABduction 15 seconds;1 rep    External Rotation 30 seconds;2 reps   elbow flexed 90, arm in neutral     Manual Therapy   Passive ROM Lt shoulder flexion, scaption, ext, IR                    PT Short Term Goals - 08/02/20 1232      PT SHORT TERM GOAL #1   Title I with initial HEP and understanding of protocol    Time 4    Period Weeks    Status Achieved     Target Date 07/25/20      PT SHORT TERM GOAL #2   Title demo Lt shoulder PROM per protocol    Time 4    Period Weeks    Status Achieved    Target Date 07/25/20      PT SHORT TERM GOAL #3   Title tolerate AAROM Lt shoulder with no more than 4/10 pain    Time 4    Period Weeks    Status Achieved    Target Date 07/25/20      PT SHORT TERM GOAL #4   Title i with self dressing and grooming    Time 4    Period Weeks    Status Achieved    Target Date 07/25/20             PT Long Term Goals - 09/25/20 1256      PT LONG TERM GOAL #1   Title independent with advanced HEP    Time 6    Status On-going      PT LONG TERM GOAL #2   Title demo Lt shoulder strength =/> 4/5 to allow initial return to recreational activities    Time 6    Period Weeks    Status Partially Met      PT LONG TERM GOAL #3   Title improve FOTO =/better than 65    Time 6    Period Weeks    Status Achieved      PT LONG TERM GOAL #4   Title demo Lt shoulder and cervical ROM WFL    Time 6    Period Weeks    Status Partially Met      PT LONG TERM GOAL #5   Title tolerate some light landscaping with no more than 3/10 Lt shoudler pain    Time 6    Period Weeks    Status Achieved                 Plan - 09/25/20 1057    Clinical Impression Statement Pt's Lt shoulder flexion ROM improved.  Pt fatigued quickly with body blade today.  Overall tolerating gradual increase in resistance for shoulder strengthening (under 5#, per protocol). Making great gains towards remaining goals.    Rehab Potential Good  PT Frequency 2x / week    PT Duration 6 weeks    PT Treatment/Interventions Iontophoresis 85m/ml Dexamethasone;Taping;Vasopneumatic Device;Patient/family education;Moist Heat;Therapeutic activities;Passive range of motion;Dry needling;Therapeutic exercise;Cryotherapy;Electrical Stimulation;Neuromuscular re-education;Manual techniques    PT Next Visit Plan Continue shoulder rehab per provided  protocol progressing end range mobility/ROM; strength; function Lt UE: work on end range passively    PT [place] Agree with Plan of Care Patient           Patient will benefit from skilled therapeutic intervention in order to improve the following deficits and impairments:  Decreased range of motion,Impaired UE functional use,Increased muscle spasms,Pain,Postural dysfunction,Decreased strength  Visit Diagnosis: Stiffness of left shoulder, not elsewhere classified  Muscle weakness (generalized)  Acute pain of left shoulder     Problem List Patient Active Problem List   Diagnosis Date Noted  . 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 Primary osteoarthritis involving multiple joints 07/26/2018  . Acute left ankle pain 07/26/2018  . Cerumen debris on tympanic membrane of right ear 07/26/2018  . Tinea pedis 07/21/2018  . Periscapular pain 07/20/2018  . Retrocalcaneal bursitis, left 07/20/2018  . Hepatitis B core antibody positive 07/20/2018  . Polyarthralgia 07/20/2018  . At risk for obstructive sleep apnea 07/20/2018  . Hearing loss due to cerumen impaction, right 11/02/2017  . Seasonal allergic rhinitis due to pollen 11/02/2017  . Fasting hyperglycemia 08/06/2017  . Primary osteoarthritis of right hip 08/06/2017  . Chronic right-sided low back pain without sciatica 08/06/2017  . Hypertriglyceridemia 08/06/2017  . 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  . Gastroesophageal reflux disease without esophagitis 07/09/2017  . Hypertension goal BP (blood pressure) < 130/80 07/09/2017  . Class 2 obesity due to excess calories in adult 07/09/2017   JKerin Perna PTA 09/25/20 12:56 PM  [email or web address] [address]Phone: [phone]  Fax:  [phone] Name: Adrian MAGGIOMRN: [id number] of Birth: 902-12-1960

## 2020-09-27 ENCOUNTER — Encounter: Payer: Self-pay | Admitting: Rehabilitative and Restorative Service Providers"

## 2020-09-27 ENCOUNTER — Other Ambulatory Visit: Payer: Self-pay

## 2020-09-27 ENCOUNTER — Ambulatory Visit (INDEPENDENT_AMBULATORY_CARE_PROVIDER_SITE_OTHER): Payer: BC Managed Care – PPO | Admitting: Rehabilitative and Restorative Service Providers"

## 2020-09-27 DIAGNOSIS — M25612 Stiffness of left shoulder, not elsewhere classified: Secondary | ICD-10-CM | POA: Diagnosis not present

## 2020-09-27 DIAGNOSIS — M6281 Muscle weakness (generalized): Secondary | ICD-10-CM | POA: Diagnosis not present

## 2020-09-27 DIAGNOSIS — R600 Localized edema: Secondary | ICD-10-CM | POA: Diagnosis not present

## 2020-09-27 DIAGNOSIS — M25512 Pain in left shoulder: Secondary | ICD-10-CM

## 2020-09-27 NOTE — Therapy (Signed)
[place], [place], [address] Phone: [phone]   Fax:  [phone]  Physical Therapy Treatment  Patient Details  Name: Adrian [name] MRN: [id number] Date of Birth: December 22, 1960 Referring Provider (PT): Dr Ophelia Charter   Encounter Date: 09/27/2020   PT End of Session - 09/27/20 1017    Visit Number 24    Number of Visits 28    Date for PT Re-Evaluation 10/03/20    Authorization Type BCBS    PT Start Time 1016    PT Stop Time 1108    PT Time Calculation (min) 52 min    Activity Tolerance Patient tolerated treatment well           Past Medical History:  Diagnosis Date  . Chronic right hip pain 08/06/2017  . Chronic right-sided low back pain without sciatica 08/06/2017  . GERD (gastroesophageal reflux disease)   . Hypertension   . Hypertriglyceridemia 08/06/2017  . Seasonal allergies     Past Surgical History:  Procedure Laterality Date  . ESOPHAGOGASTRODUODENOSCOPY    . NO PAST SURGERIES    . SHOULDER ARTHROSCOPY WITH SUBACROMIAL DECOMPRESSION AND BICEP TENDON REPAIR Left 05/31/2020   Procedure: LEFT SHOULDER ARTHROSCOPY WITH DEBRIDEMENT, DISTAL CLAVICAL EXCISION,  SUBACROMIAL DECOMPRESSION, BICEPS TENODESIS;  Surgeon: Hiram Gash, MD;  Location: [facility];  Service: Orthopedics;  Laterality: Left;    There were no vitals filed for this visit.   Subjective Assessment - 09/27/20 1017    Subjective Lt shoulder is not bad. He has had some spasm in the Lt shoudler yesterday and this am. Still awakens with pain in either shoulder when he is on either side. Only hurts whe he is lying on the shoulder at night.    Currently in Pain? No/denies    Pain Score 0-No pain    Pain Location Shoulder    Pain Orientation Left    Pain Descriptors / Indicators Sore    Pain Type Surgical pain;Chronic pain    Pain Onset More than a month ago                              [facility] Adult PT Treatment/Exercise - 09/27/20 0001      Shoulder Exercises: Standing   Row Strengthening;Left;10 reps;Weights   bent row 2 sets   Row Weight (lbs) 5   kettlebell   Row Limitations bent row with triceps kick back 5# x 2 sets    Shoulder Elevation Limitations overhead press 5# kettlebell 5 reps x 2 sets      Shoulder Exercises: ROM/Strengthening   Nustep L6: arms only x 4 min      Shoulder Exercises: Stretch   Internal Rotation Stretch 3 reps   20 sec hold sliding dowel up back   Other Shoulder Stretches doorway 3 positions 20-30 sec hold x 2 reps each position; biceps stretch 20-30 sec x 2 reps; stretching through the doorway with hand on dowel in shoulder flexion x 3 reps      Moist Heat Therapy   Number Minutes Moist Heat 10 Minutes    Moist Heat Location Shoulder      Manual Therapy   Manual therapy comments skilled palpation to assess response to DN/manual work    Joint Mobilization [place] circumduction    Soft tissue mobilization anterior chest/pecs to anterior deltoid, biceps; upper trap    Passive ROM Lt shoulder flexion, scaption,  ER, IR    Manual Traction traction through the long arm Lt UE 5-10 sec several reps during PROM/stretching                    PT Short Term Goals - 08/02/20 1232      PT SHORT TERM GOAL #1   Title I with initial HEP and understanding of protocol    Time 4    Period Weeks    Status Achieved    Target Date 07/25/20      PT SHORT TERM GOAL #2   Title demo Lt shoulder PROM per protocol    Time 4    Period Weeks    Status Achieved    Target Date 07/25/20      PT SHORT TERM GOAL #3   Title tolerate AAROM Lt shoulder with no more than 4/10 pain    Time 4    Period Weeks    Status Achieved    Target Date 07/25/20      PT SHORT TERM GOAL #4   Title i with self dressing and grooming    Time 4    Period Weeks    Status Achieved    Target Date 07/25/20             PT Long Term Goals - 09/25/20 1256      PT LONG  TERM GOAL #1   Title independent with advanced HEP    Time 6    Status On-going      PT LONG TERM GOAL #2   Title demo Lt shoulder strength =/> 4/5 to allow initial return to recreational activities    Time 6    Period Weeks    Status Partially Met      PT LONG TERM GOAL #3   Title improve FOTO =/better than 65    Time 6    Period Weeks    Status Achieved      PT LONG TERM GOAL #4   Title demo Lt shoulder and cervical ROM WFL    Time 6    Period Weeks    Status Partially Met      PT LONG TERM GOAL #5   Title tolerate some light landscaping with no more than 3/10 Lt shoudler pain    Time 6    Period Weeks    Status Achieved                 Plan - 09/27/20 1021    Clinical Impression Statement Continued gradual improvement in Lt shoulder strength - protocol limits lifting to 5#. Trial of overhead press and kettlebell swing with Lt LE tolerated well. Patient has continue muscular tightness in the pec major/pec minor. Trial of DN and manual work through [place] with good decrease in palpable tightness.    Rehab Potential Good    PT Frequency 2x / week    PT Duration 6 weeks    PT Treatment/Interventions Iontophoresis 72m/ml Dexamethasone;Taping;Vasopneumatic Device;Patient/family education;Moist Heat;Therapeutic activities;Passive range of motion;Dry needling;Therapeutic exercise;Cryotherapy;Electrical Stimulation;Neuromuscular re-education;Manual techniques    PT Next Visit Plan Continue shoulder rehab per provided protocol progressing end range mobility/ROM; strength; function Lt UE: work on end range passively    PT [place] Agree with Plan of Care Patient           Patient will benefit from skilled therapeutic intervention in order to improve the following deficits and impairments:  Visit Diagnosis: Stiffness of left shoulder, not elsewhere classified  Muscle weakness (generalized)  Acute pain of left shoulder  Localized  edema     Problem List Patient Active Problem List   Diagnosis Date Noted  . 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 Primary osteoarthritis involving multiple joints 07/26/2018  . Acute left ankle pain 07/26/2018  . Cerumen debris on tympanic membrane of right ear 07/26/2018  . Tinea pedis 07/21/2018  . Periscapular pain 07/20/2018  . Retrocalcaneal bursitis, left 07/20/2018  . Hepatitis B core antibody positive 07/20/2018  . Polyarthralgia 07/20/2018  . At risk for obstructive sleep apnea 07/20/2018  . Hearing loss due to cerumen impaction, right 11/02/2017  . Seasonal allergic rhinitis due to pollen 11/02/2017  . Fasting hyperglycemia 08/06/2017  . Primary osteoarthritis of right hip 08/06/2017  . Chronic right-sided low back pain without sciatica 08/06/2017  . Hypertriglyceridemia 08/06/2017  . 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  . Gastroesophageal reflux disease without esophagitis 07/09/2017  . Hypertension goal BP (blood pressure) < 130/80 07/09/2017  . Class 2 obesity due to excess calories in adult 07/09/2017    Raymund Manrique Nilda Simmer PT, MPH  09/27/2020, 11:28 AM  [facility] [place], [place], [address] Phone: [phone]   Fax:  [phone]  Name: Adrian [name] MRN: [id number] Date of Birth: [birth year]

## 2020-10-02 ENCOUNTER — Encounter: Payer: Self-pay | Admitting: Osteopathic Medicine

## 2020-10-02 ENCOUNTER — Ambulatory Visit (INDEPENDENT_AMBULATORY_CARE_PROVIDER_SITE_OTHER): Payer: BC Managed Care – PPO | Admitting: Osteopathic Medicine

## 2020-10-02 ENCOUNTER — Encounter: Payer: Self-pay | Admitting: Rehabilitative and Restorative Service Providers"

## 2020-10-02 ENCOUNTER — Other Ambulatory Visit: Payer: Self-pay

## 2020-10-02 ENCOUNTER — Ambulatory Visit (INDEPENDENT_AMBULATORY_CARE_PROVIDER_SITE_OTHER): Payer: BC Managed Care – PPO | Admitting: Rehabilitative and Restorative Service Providers"

## 2020-10-02 VITALS — BP 131/75 | HR 72 | Temp 98.9°F | Wt 232.0 lb

## 2020-10-02 DIAGNOSIS — M25612 Stiffness of left shoulder, not elsewhere classified: Secondary | ICD-10-CM

## 2020-10-02 DIAGNOSIS — K219 Gastro-esophageal reflux disease without esophagitis: Secondary | ICD-10-CM | POA: Diagnosis not present

## 2020-10-02 DIAGNOSIS — J301 Allergic rhinitis due to pollen: Secondary | ICD-10-CM

## 2020-10-02 DIAGNOSIS — M25512 Pain in left shoulder: Secondary | ICD-10-CM

## 2020-10-02 DIAGNOSIS — R600 Localized edema: Secondary | ICD-10-CM | POA: Diagnosis not present

## 2020-10-02 DIAGNOSIS — E785 Hyperlipidemia, unspecified: Secondary | ICD-10-CM

## 2020-10-02 DIAGNOSIS — I10 Essential (primary) hypertension: Secondary | ICD-10-CM | POA: Diagnosis not present

## 2020-10-02 DIAGNOSIS — M6281 Muscle weakness (generalized): Secondary | ICD-10-CM

## 2020-10-02 DIAGNOSIS — Z125 Encounter for screening for malignant neoplasm of prostate: Secondary | ICD-10-CM

## 2020-10-02 DIAGNOSIS — Z0000 Encounter for general adult medical examination without abnormal findings: Secondary | ICD-10-CM

## 2020-10-02 MED ORDER — FLUTICASONE PROPIONATE 50 MCG/ACT NA SUSP: 1.0000 | Freq: Every day | NASAL | 3 refills | Status: DC

## 2020-10-02 MED ORDER — LORATADINE 10 MG PO TABS: 10.0000 mg | ORAL_TABLET | Freq: Every day | ORAL | 3 refills | Status: DC

## 2020-10-02 MED ORDER — FAMOTIDINE 20 MG PO TABS: 20.0000 mg | ORAL_TABLET | Freq: Every day | ORAL | 3 refills | Status: DC

## 2020-10-02 MED ORDER — HYDROCHLOROTHIAZIDE 12.5 MG PO TABS
12.5000 mg | ORAL_TABLET | Freq: Every day | ORAL | 3 refills | Status: DC
Start: 2020-10-02 — End: 2021-12-04

## 2020-10-02 NOTE — Therapy (Signed)
[place], [place], [address] Phone: [phone]   Fax:  [phone]  Physical Therapy Treatment  Patient Details  Name: Adrian [name] MRN: [id number] Date of Birth: 12-14-1960 Referring Provider (PT): Dr Ophelia Charter   Encounter Date: 10/02/2020   PT End of Session - 10/02/20 1022    Visit Number 25    Number of Visits 28    Date for PT Re-Evaluation 10/03/20    Authorization Type BCBS    PT Start Time 1014    PT Stop Time 1103    PT Time Calculation (min) 49 min    Activity Tolerance Patient tolerated treatment well           Past Medical History:  Diagnosis Date  . Chronic right hip pain 08/06/2017  . Chronic right-sided low back pain without sciatica 08/06/2017  . GERD (gastroesophageal reflux disease)   . Hypertension   . Hypertriglyceridemia 08/06/2017  . Seasonal allergies     Past Surgical History:  Procedure Laterality Date  . ESOPHAGOGASTRODUODENOSCOPY    . NO PAST SURGERIES    . SHOULDER ARTHROSCOPY WITH SUBACROMIAL DECOMPRESSION AND BICEP TENDON REPAIR Left 05/31/2020   Procedure: LEFT SHOULDER ARTHROSCOPY WITH DEBRIDEMENT, DISTAL CLAVICAL EXCISION,  SUBACROMIAL DECOMPRESSION, BICEPS TENODESIS;  Surgeon: Hiram Gash, MD;  Location: [place];  Service: Orthopedics;  Laterality: Left;    There were no vitals filed for this visit.   Subjective Assessment - 10/02/20 1023    Subjective Patient reports that his arm was very tired following DN and exercise last visit. Working on his exercises at home.    Currently in Pain? No/denies    Pain Score 0-No pain                             [facility] Adult PT Treatment/Exercise - 10/02/20 0001      Shoulder Exercises: Standing   ABduction Limitations horizontal abduction Lt in bent forward position 5# kettlebell 3 sets of 10    Row Strengthening;Left;10 reps;Weights   bent row 2 sets   Row Weight (lbs)  5   kettlebell   Row Limitations bent row with triceps kick back 5# x 3 sets    Shoulder Elevation Limitations overhead press 5# kettlebell 5 reps x 2 sets      Shoulder Exercises: ROM/Strengthening   Nustep L6: arms only x 4 min    Wall Pushups 10 reps    Plank Limitations side plank Lt 60 sec      Shoulder Exercises: Stretch   Internal Rotation Stretch 3 reps   20 sec hold sliding dowel up back   Other Shoulder Stretches doorway 3 positions 20-30 sec hold x 2 reps each position; biceps stretch 20-30 sec x 2 reps; stretching through the doorway with hand on dowel in shoulder flexion x 3 reps      Shoulder Exercises: Body Blade   Flexion 30 seconds;3 reps    ABduction 30 seconds;1 rep      Moist Heat Therapy   Number Minutes Moist Heat 10 Minutes    Moist Heat Location Shoulder      Manual Therapy   Manual therapy comments skilled palpation to assess response to DN/manual work    Joint Mobilization [facility] circumduction    Soft tissue mobilization anterior chest/pecs to anterior deltoid, biceps; upper trap    Passive ROM Lt shoulder flexion, scaption, ER, IR  Manual Traction traction through the long arm Lt UE 5-10 sec several reps during PROM/stretching            Trigger Point Dry Needling - 10/02/20 0001    Consent Given? Yes    Education Handout Provided Previously provided    Pectoralis Major Response Palpable increased muscle length;Twitch response elicited    Pectoralis Minor Response Palpable increased muscle length    Deltoid Response Palpable increased muscle length    Biceps Response Palpable increased muscle length;Twitch response elicited                  PT Short Term Goals - 08/02/20 1232      PT SHORT TERM GOAL #1   Title I with initial HEP and understanding of protocol    Time 4    Period Weeks    Status Achieved    Target Date 07/25/20      PT SHORT TERM GOAL #2   Title demo Lt shoulder PROM per protocol    Time 4    Period Weeks    Status  Achieved    Target Date 07/25/20      PT SHORT TERM GOAL #3   Title tolerate AAROM Lt shoulder with no more than 4/10 pain    Time 4    Period Weeks    Status Achieved    Target Date 07/25/20      PT SHORT TERM GOAL #4   Title i with self dressing and grooming    Time 4    Period Weeks    Status Achieved    Target Date 07/25/20             PT Long Term Goals - 09/25/20 1256      PT LONG TERM GOAL #1   Title independent with advanced HEP    Time 6    Status On-going      PT LONG TERM GOAL #2   Title demo Lt shoulder strength =/> 4/5 to allow initial return to recreational activities    Time 6    Period Weeks    Status Partially Met      PT LONG TERM GOAL #3   Title improve FOTO =/better than 65    Time 6    Period Weeks    Status Achieved      PT LONG TERM GOAL #4   Title demo Lt shoulder and cervical ROM WFL    Time 6    Period Weeks    Status Partially Met      PT LONG TERM GOAL #5   Title tolerate some light landscaping with no more than 3/10 Lt shoudler pain    Time 6    Period Weeks    Status Achieved                 Plan - 10/02/20 1025    Clinical Impression Statement Good response to DN and manual work with decresaed palpable tightness noted through the anterior Lt shoulder. Continues to work to gain strength in [employer]. Rt shoulder surgery scheduled for Monday 10/08/20.    Rehab Potential Good    PT Frequency 2x / week    PT Duration 6 weeks    PT Treatment/Interventions Iontophoresis 32m/ml Dexamethasone;Taping;Vasopneumatic Device;Patient/family education;Moist Heat;Therapeutic activities;Passive range of motion;Dry needling;Therapeutic exercise;Cryotherapy;Electrical Stimulation;Neuromuscular re-education;Manual techniques    PT Next Visit Plan Continue shoulder rehab per provided protocol progressing end range mobility/ROM; strength; function Lt UE: work on  end range passively - DN Lt shoulder girdle as indicated - measurements Lt/Rt  shoulders next visit to [facility] baseline prior to surgery    PT Home Exercise Plan [id number]    Consulted and Agree with Plan of Care Patient           Patient will benefit from skilled therapeutic intervention in order to improve the following deficits and impairments:     Visit Diagnosis: Stiffness of left shoulder, not elsewhere classified  Muscle weakness (generalized)  Acute pain of left shoulder  Localized edema     Problem List Patient Active Problem List   Diagnosis Date Noted  . 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 Primary osteoarthritis involving multiple joints 07/26/2018  . Acute left ankle pain 07/26/2018  . Cerumen debris on tympanic membrane of right ear 07/26/2018  . Tinea pedis 07/21/2018  . Periscapular pain 07/20/2018  . Retrocalcaneal bursitis, left 07/20/2018  . Hepatitis B core antibody positive 07/20/2018  . Polyarthralgia 07/20/2018  . At risk for obstructive sleep apnea 07/20/2018  . Hearing loss due to cerumen impaction, right 11/02/2017  . Seasonal allergic rhinitis due to pollen 11/02/2017  . Fasting hyperglycemia 08/06/2017  . Primary osteoarthritis of right hip 08/06/2017  . Chronic right-sided low back pain without sciatica 08/06/2017  . Hypertriglyceridemia 08/06/2017  . 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  . Gastroesophageal reflux disease without esophagitis 07/09/2017  . Hypertension goal BP (blood pressure) < 130/80 07/09/2017  . Class 2 obesity due to excess calories in adult 07/09/2017    [place] PT, MPH  10/02/2020, 11:00 AM  [place] [facility] [place], [place], [address] Phone: [phone]   Fax:  [phone]  Name: Adrian [name] MRN: [id number] Date of Birth: 1961/03/03

## 2020-10-02 NOTE — Progress Notes (Signed)
Adrian [name] is a 60 y.o. male who presents to  [facility]  today, 10/02/20, seeking care for the following: . Annual check-up    ASSESSMENT & PLAN with other pertinent history/findings:  The primary encounter diagnosis was Annual physical exam. Diagnoses of Dyslipidemia, goal LDL below 100, Hypertension goal BP (blood pressure) < 130/80, Gastroesophageal reflux disease, unspecified whether esophagitis present, Prostate cancer screening, Mild peripheral edema, and Seasonal allergic rhinitis due to pollen were also pertinent to this visit.  Stable chronic issues  Refilled Rx Having shoudler surgery on Monday - no concerns  Pt would liek to hold off on Shingles vaccine until after his surgery COVID booster advised   BP Readings from Last 3 Encounters:  10/02/20 131/75  05/31/20 128/73  04/04/20 124/71       Patient Instructions  General Preventive Care  Most recent routine screening labs: ordered today - pt getting these w/ wellness exam at work, will bring records to [place] .   BP goal 130/80 or less.   Tobacco: don't!   Alcohol: responsible moderation is ok for most adults - if you have concerns about your alcohol intake, please talk to me!   Exercise: as tolerated to reduce risk of cardiovascular disease and diabetes. Strength training will also prevent osteoporosis.   Mental health: if need for mental health care (medicines, counseling, other), or concerns about moods, please let me know!   Sexual health: if need for STI testing, or if concerns with libido/pain problems, please let me know!   Advanced Directive: Living Will and/or Healthcare Power of Attorney recommended for all adults, regardless of age or health.  Vaccines  Flu vaccine: for almost everyone, every fall.   Shingles vaccine: after age 20. Recommended   Pneumonia vaccines: after age 55  Tetanus booster: every 10 years - due 2024  COVID  vaccine: RECOMMENDED!  Cancer screenings   Colon cancer screening: Colonoscopy DUE 02/2023  Prostate cancer screening: PSA blood test annually 55-71  Lung cancer screening: not needed for non-smokers  Infection screenings   HIV: recommended screening at least once age 84-65 done - repeat as needed   Gonorrhea/Chlamydia, other STI: screening as needed  Hepatitis C: recommended once for everyone age 29-75 done - repeat as needed   TB: certain at-risk populations, or depending on work requirements and/or travel history Other  Bone Density Test: recommended for men at age 47  Abdominal Aortic Aneurysm: screening not needed for non-smokers     Orders Placed This Encounter  Procedures  . CBC  . COMPLETE METABOLIC PANEL WITH GFR  . Lipid panel  . PSA, Total with Reflex to PSA, Free    Meds ordered this encounter  Medications  . loratadine (CLARITIN) 10 MG tablet    Sig: Take 1 tablet (10 mg total) by mouth daily.    Dispense:  90 tablet    Refill:  3  . hydrochlorothiazide (HYDRODIURIL) 12.5 MG tablet    Sig: Take 1 tablet (12.5 mg total) by mouth daily.    Dispense:  90 tablet    Refill:  3  . fluticasone (FLONASE) 50 MCG/ACT nasal spray    Sig: Place 1 spray into both nostrils daily.    Dispense:  48 g    Refill:  3  . famotidine (PEPCID) 20 MG tablet    Sig: Take 1-2 tablets (20-40 mg total) by mouth daily.    Dispense:  180 tablet  Refill:  3   Constitutional:  . VSS, see nurse notes . General Appearance: alert, well-developed, well-nourished, NAD Eyes: Marland Kitchen Normal lids and conjunctive, non-icteric sclera . PERRLA Ears, Nose, Mouth, Throat: . Normal appearance . MMM, posterior pharynx without erythema/exudate Neck: . No masses, trachea midline . No thyroid enlargement/tenderness/mass appreciated Respiratory: . Normal respiratory effort . Breath sounds normal, no wheeze/rhonchi/rales Cardiovascular: . S1/S2 normal, no murmur/rub/gallop  auscultated . Pedal pulse II/IV bilaterally DP and PT . No lower extremity edema Gastrointestinal: . Nontender, no masses . No hepatomegaly, no splenomegaly . No hernia appreciated Musculoskeletal:  . Gait normal . No clubbing/cyanosis of digits . (+) tinel's and phalen's  Neurological: . No cranial nerve deficit on limited exam . Motor and sensation intact and symmetric Psychiatric: . Normal judgment/insight . Normal mood and affect     Follow-up instructions: Return for 6 months nurse visit monitor BP, 1 year annual physical w/ labs, see [place] otherwise as needed! . Follow w/ sports med for wrist/hand/knee issues as needed                                          BP 131/75 (BP Location: Left Arm, Patient Position: Sitting, Cuff Size: Large)   Pulse 72   Temp 98.9 F (37.2 C) (Oral)   Wt 232 lb 0.6 oz (105.3 kg)   BMI 36.34 kg/m   Current Meds  Medication Sig  . [DISCONTINUED] famotidine (PEPCID) 20 MG tablet Take 1 tablet (20 mg total) by mouth 2 (two) times daily as needed for heartburn or indigestion.  . [DISCONTINUED] fluticasone (FLONASE) 50 MCG/ACT nasal spray Place 1 spray into both nostrils daily.  . [DISCONTINUED] hydrochlorothiazide (HYDRODIURIL) 12.5 MG tablet Take 1 tablet (12.5 mg total) by mouth daily.    No results found for this or any previous visit (from the past 72 hour(s)).  No results found.  Depression screen [facility] 2/9 10/02/2020 10/03/2019 03/25/2019  Decreased Interest 0 0 0  Down, Depressed, Hopeless 0 0 0  PHQ - 2 Score 0 0 0  Altered sleeping - 1 1  Tired, decreased energy - 0 1  Change in appetite - 0 0  Feeling bad or failure about yourself  - 0 0  Trouble concentrating - 0 0  Moving slowly or fidgety/restless - 0 0  Suicidal thoughts - 0 0  PHQ-9 Score - 1 2  Difficult doing work/chores - Not difficult at all Not difficult at all    GAD 7 : Generalized Anxiety Score 10/03/2019 03/25/2019  Nervous,  Anxious, on Edge 0 0  Control/stop worrying 0 0  Worry too much - different things 0 0  Trouble relaxing 0 0  Restless 0 0  Easily annoyed or irritable 0 0  Afraid - awful might happen 0 0  Total GAD 7 Score 0 0    Immunization History  Administered Date(s) Administered  . Influenza Inj Mdck Quad Pf 03/18/2019  . Influenza,inj,Quad PF,6+ Mos 07/09/2017, 04/12/2020  . Influenza-Unspecified 03/18/2019  . PFIZER(Purple Top)SARS-COV-2 Vaccination 09/20/2019, 10/14/2019  . Tdap 10/01/2012    Social History   Tobacco Use  Smoking Status Never Smoker  Smokeless Tobacco Never Used    All questions at time of visit were answered - patient instructed to contact office with any additional concerns or updates.  ER/RTC precautions were reviewed with the patient.  Please note: voice recognition software was  used to produce this document, and typos may escape review. Please contact Dr. Sheppard Coil for any needed clarifications.

## 2020-10-02 NOTE — Patient Instructions (Addendum)
General Preventive Care  Most recent routine screening labs: ordered today.   BP goal 130/80 or less.   Tobacco: don't!   Alcohol: responsible moderation is ok for most adults - if you have concerns about your alcohol intake, please talk to me!   Exercise: as tolerated to reduce risk of cardiovascular disease and diabetes. Strength training will also prevent osteoporosis.   Mental health: if need for mental health care (medicines, counseling, other), or concerns about moods, please let me know!   Sexual health: if need for STI testing, or if concerns with libido/pain problems, please let me know!   Advanced Directive: Living Will and/or Healthcare Power of Attorney recommended for all adults, regardless of age or health.  Vaccines  Flu vaccine: for almost everyone, every fall.   Shingles vaccine: after age 69.   Pneumonia vaccines: after age 23  Tetanus booster: every 10 years - due 2024  COVID vaccine: RECOMMENDED!  Cancer screenings   Colon cancer screening: Colonoscopy DUE 02/2023  Prostate cancer screening: PSA blood test annually 55-71  Lung cancer screening: not needed for non-smokers  Infection screenings   HIV: recommended screening at least once age 16-65 done - repeat as needed   Gonorrhea/Chlamydia, other STI: screening as needed  Hepatitis C: recommended once for everyone age 21-75 done - repeat as needed   TB: certain at-risk populations, or depending on work requirements and/or travel history Other  Bone Density Test: recommended for men at age 23  Abdominal Aortic Aneurysm: screening not needed for non-smokers

## 2020-10-04 ENCOUNTER — Ambulatory Visit (INDEPENDENT_AMBULATORY_CARE_PROVIDER_SITE_OTHER): Payer: BC Managed Care – PPO | Admitting: Rehabilitative and Restorative Service Providers"

## 2020-10-04 ENCOUNTER — Encounter: Payer: Self-pay | Admitting: Rehabilitative and Restorative Service Providers"

## 2020-10-04 ENCOUNTER — Other Ambulatory Visit: Payer: Self-pay

## 2020-10-04 DIAGNOSIS — M25512 Pain in left shoulder: Secondary | ICD-10-CM | POA: Diagnosis not present

## 2020-10-04 DIAGNOSIS — R600 Localized edema: Secondary | ICD-10-CM

## 2020-10-04 DIAGNOSIS — M6281 Muscle weakness (generalized): Secondary | ICD-10-CM

## 2020-10-04 DIAGNOSIS — M25612 Stiffness of left shoulder, not elsewhere classified: Secondary | ICD-10-CM

## 2020-10-04 NOTE — Therapy (Signed)
[facility] [place] [address] [place], [place], [address] Phone: [phone]   Fax:  [phone]  Physical Therapy Treatment  Patient Details  Name: Adrian [name] MRN: [id number] Date of Birth: June 14, 1960 Referring Provider (PT): Dr Ramond Marrow   Encounter Date: 10/04/2020   PT End of Session - 10/04/20 1011    Visit Number 26    Number of Visits 28    Date for PT Re-Evaluation 10/03/20    Authorization Type BCBS    PT Start Time 1011    PT Stop Time 1057    PT Time Calculation (min) 46 min    Activity Tolerance Patient tolerated treatment well           Past Medical History:  Diagnosis Date  . Chronic right hip pain 08/06/2017  . Chronic right-sided low back pain without sciatica 08/06/2017  . GERD (gastroesophageal reflux disease)   . Hypertension   . Hypertriglyceridemia 08/06/2017  . Seasonal allergies     Past Surgical History:  Procedure Laterality Date  . ESOPHAGOGASTRODUODENOSCOPY    . NO PAST SURGERIES    . SHOULDER ARTHROSCOPY WITH SUBACROMIAL DECOMPRESSION AND BICEP TENDON REPAIR Left 05/31/2020   Procedure: LEFT SHOULDER ARTHROSCOPY WITH DEBRIDEMENT, DISTAL CLAVICAL EXCISION,  SUBACROMIAL DECOMPRESSION, BICEPS TENODESIS;  Surgeon: [name], MD;  Location: [facility];  Service: Orthopedics;  Laterality: Left;    There were no vitals filed for this visit.   Subjective Assessment - 10/04/20 1011    Subjective Patient reports that he is feeling achy. He has stopped all his medication getting ready for surgery next week.    Currently in Pain? No/denies    Pain Score 0-No pain              [facility] PT Assessment - 10/04/20 0001      Assessment   Medical Diagnosis Lt RTC repair with bicep tendodesis and DCR    Referring Provider (PT) Dr Ramond Marrow    Onset Date/Surgical Date 05/31/20    Hand Dominance Right    Next MD Visit 08/23/20    Prior Therapy not for shoulder       Observation/Other Assessments   Focus on Therapeutic Outcomes (FOTO)  72      AROM   Right/Left Shoulder --   assessed in standing   Right Shoulder Extension 65 Degrees    Right Shoulder Flexion 145 Degrees    Right Shoulder ABduction 158 Degrees    Right Shoulder Internal Rotation --   thumb T10   Right Shoulder External Rotation 90 Degrees    Left Shoulder Extension 60 Degrees    Left Shoulder Flexion 144 Degrees    Left Shoulder ABduction 142 Degrees    Left Shoulder Internal Rotation --   thumb T9   Left Shoulder External Rotation 90 Degrees    Right/Left Forearm --   WFL's   Cervical Flexion 55    Cervical Extension 63    Cervical - Right Side Bend 37    Cervical - Left Side Bend 40    Cervical - Right Rotation 68    Cervical - Left Rotation 68      PROM   Right/Left Shoulder --   assessed in supine   Left Shoulder Flexion 148 Degrees    Left Shoulder ABduction 141 Degrees   in scapular plane   Left Shoulder Internal Rotation 42 Degrees   in scapular plane   Left Shoulder External Rotation 82 Degrees  in scapular plane     Strength   Left Shoulder Flexion 5/5    Left Shoulder Extension 5/5    Left Shoulder ABduction 5/5    Left Shoulder Internal Rotation 5/5    Left Shoulder External Rotation 5/5      Palpation   Palpation comment improved tightness through Lt pecs, biceps and posterior shoulder                         [facility] Adult PT Treatment/Exercise - 10/04/20 0001      Shoulder Exercises: Standing   ABduction Limitations horizontal abduction Lt in bent forward position 5# kettlebell 3 sets of 10    Row Strengthening;Left;10 reps;Weights   bent row 2 sets   Row Weight (lbs) 5   kettlebell   Row Limitations bent row with triceps kick back 5# x 3 sets    Shoulder Elevation Limitations overhead press 5# kettlebell 10 reps x 1 set; 5 reps x 2 sets      Shoulder Exercises: Therapy Ball   Other Therapy Ball Exercises ER rolling coregous ball  against dorsum of hand, small circles x 1-2 min x 2 reps    Other Therapy Ball Exercises catch and release red weighted ball 20 reps x 2 sets      Shoulder Exercises: ROM/Strengthening   Rebounder red ball x 30 reps    UBE (Upper Arm Bike) L7 x 4 min; 2 min fwd/2 min back    Wall Pushups 10 reps    Plank Limitations side plank Lt 60 sec x 2 reps      Shoulder Exercises: Body Blade   Flexion 30 seconds;2 reps    ABduction 30 seconds;2 reps                    PT Short Term Goals - 08/02/20 1232      PT SHORT TERM GOAL #1   Title I with initial HEP and understanding of protocol    Time 4    Period Weeks    Status Achieved    Target Date 07/25/20      PT SHORT TERM GOAL #2   Title demo Lt shoulder PROM per protocol    Time 4    Period Weeks    Status Achieved    Target Date 07/25/20      PT SHORT TERM GOAL #3   Title tolerate AAROM Lt shoulder with no more than 4/10 pain    Time 4    Period Weeks    Status Achieved    Target Date 07/25/20      PT SHORT TERM GOAL #4   Title i with self dressing and grooming    Time 4    Period Weeks    Status Achieved    Target Date 07/25/20             PT Long Term Goals - 10/04/20 1021      PT LONG TERM GOAL #1   Title independent with advanced HEP    Time 6    Period Weeks    Status Achieved      PT LONG TERM GOAL #2   Title demo Lt shoulder strength =/> 4/5 to allow initial return to recreational activities    Time 6    Period Weeks    Status Achieved      PT LONG TERM GOAL #3   Title improve FOTO =/better than 65  Time 6    Period Weeks    Status Achieved      PT LONG TERM GOAL #4   Title demo Lt shoulder and cervical ROM WFL    Time 6    Period Weeks    Status Achieved      PT LONG TERM GOAL #5   Title tolerate some light landscaping with no more than 3/10 Lt shoulder pain    Time 6    Period Weeks    Status Achieved                 Plan - 10/04/20 1022    Clinical Impression  Statement Patient is scheduled for Rt shoulder surgery Monday 10/08/20. He is independent in HEP and goals of therapy have been accomplished for Lt shoulder rehab.    Rehab Potential Good    PT Frequency 2x / week    PT Duration 6 weeks    PT Treatment/Interventions Iontophoresis 4mg /ml Dexamethasone;Taping;Vasopneumatic Device;Patient/family education;Moist Heat;Therapeutic activities;Passive range of motion;Dry needling;Therapeutic exercise;Cryotherapy;Electrical Stimulation;Neuromuscular re-education;Manual techniques    PT Next Visit Plan Patient on hold pending Rt shoulder surgery    PT Home Exercise Plan [id number]    Consulted and Agree with Plan of Care Patient           Patient will benefit from skilled therapeutic intervention in order to improve the following deficits and impairments:     Visit Diagnosis: Stiffness of left shoulder, not elsewhere classified  Muscle weakness (generalized)  Acute pain of left shoulder  Localized edema     Problem List Patient Active Problem List   Diagnosis Date Noted  . 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 Primary osteoarthritis involving multiple joints 07/26/2018  . Acute left ankle pain 07/26/2018  . Cerumen debris on tympanic membrane of right ear 07/26/2018  . Tinea pedis 07/21/2018  . Periscapular pain 07/20/2018  . Retrocalcaneal bursitis, left 07/20/2018  . Hepatitis B core antibody positive 07/20/2018  . Polyarthralgia 07/20/2018  . At risk for obstructive sleep apnea 07/20/2018  . Hearing loss due to cerumen impaction, right 11/02/2017  . Seasonal allergic rhinitis due to pollen 11/02/2017  . Fasting hyperglycemia 08/06/2017  . Primary osteoarthritis of right hip 08/06/2017  . Chronic right-sided low back pain without sciatica 08/06/2017  . Hypertriglyceridemia 08/06/2017  . 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  . Gastroesophageal reflux disease without esophagitis 07/09/2017  . Hypertension goal  BP (blood pressure) < 130/80 07/09/2017  . Class 2 obesity due to excess calories in adult 07/09/2017    Shenetta Schnackenberg 07/11/2017 PT, MPH  10/04/2020, 10:59 AM  [facility] [address] [place], [place], [place] Phone: [phone]   Fax:  [phone]  Name: VEDANT SHEHADEH MRN: Frankey Poot Date of Birth: 08-Feb-1961

## 2020-10-08 DIAGNOSIS — M75121 Complete rotator cuff tear or rupture of right shoulder, not specified as traumatic: Secondary | ICD-10-CM | POA: Diagnosis not present

## 2020-10-08 DIAGNOSIS — M24111 Other articular cartilage disorders, right shoulder: Secondary | ICD-10-CM | POA: Diagnosis not present

## 2020-10-08 DIAGNOSIS — G8918 Other acute postprocedural pain: Secondary | ICD-10-CM | POA: Diagnosis not present

## 2020-10-08 DIAGNOSIS — M7521 Bicipital tendinitis, right shoulder: Secondary | ICD-10-CM | POA: Diagnosis not present

## 2020-10-08 DIAGNOSIS — M7541 Impingement syndrome of right shoulder: Secondary | ICD-10-CM | POA: Diagnosis not present

## 2020-10-08 DIAGNOSIS — S46011A Strain of muscle(s) and tendon(s) of the rotator cuff of right shoulder, initial encounter: Secondary | ICD-10-CM | POA: Diagnosis not present

## 2020-10-08 DIAGNOSIS — M19011 Primary osteoarthritis, right shoulder: Secondary | ICD-10-CM | POA: Diagnosis not present

## 2020-10-11 ENCOUNTER — Encounter: Payer: Self-pay | Admitting: Rehabilitative and Restorative Service Providers"

## 2020-10-11 ENCOUNTER — Ambulatory Visit (INDEPENDENT_AMBULATORY_CARE_PROVIDER_SITE_OTHER): Payer: BC Managed Care – PPO | Admitting: Rehabilitative and Restorative Service Providers"

## 2020-10-11 ENCOUNTER — Other Ambulatory Visit: Payer: Self-pay

## 2020-10-11 DIAGNOSIS — M25611 Stiffness of right shoulder, not elsewhere classified: Secondary | ICD-10-CM | POA: Diagnosis not present

## 2020-10-11 DIAGNOSIS — M25612 Stiffness of left shoulder, not elsewhere classified: Secondary | ICD-10-CM

## 2020-10-11 DIAGNOSIS — M25511 Pain in right shoulder: Secondary | ICD-10-CM | POA: Diagnosis not present

## 2020-10-11 DIAGNOSIS — R600 Localized edema: Secondary | ICD-10-CM | POA: Diagnosis not present

## 2020-10-11 DIAGNOSIS — G8929 Other chronic pain: Secondary | ICD-10-CM

## 2020-10-11 DIAGNOSIS — M6281 Muscle weakness (generalized): Secondary | ICD-10-CM | POA: Diagnosis not present

## 2020-10-11 NOTE — Patient Instructions (Signed)
AROM Rt forearm supination/pronation Rt wrist flexion/extension Rt fisting/opening   5-10 reps each 2-3 times/day

## 2020-10-11 NOTE — Therapy (Signed)
[facility] [place] [address] [place], [place], [address] Phone: [phone]   Fax:  [phone]  Physical Therapy Evaluation  Patient Details  Name: Adrian [name] MRN: [id number] Date of Birth: 29-Jul-1960 Referring Provider (PT): Dr Ramond Marrow   Encounter Date: 10/11/2020   PT End of Session - 10/11/20 1148    Visit Number 1    Number of Visits 24    Date for PT Re-Evaluation 01/03/21    Authorization Type BCBS    PT Start Time 1100    PT Stop Time 1149    PT Time Calculation (min) 49 min    Activity Tolerance Patient tolerated treatment well           Past Medical History:  Diagnosis Date  . Chronic right hip pain 08/06/2017  . Chronic right-sided low back pain without sciatica 08/06/2017  . GERD (gastroesophageal reflux disease)   . Hypertension   . Hypertriglyceridemia 08/06/2017  . Seasonal allergies     Past Surgical History:  Procedure Laterality Date  . ESOPHAGOGASTRODUODENOSCOPY    . NO PAST SURGERIES    . SHOULDER ARTHROSCOPY WITH SUBACROMIAL DECOMPRESSION AND BICEP TENDON REPAIR Left 05/31/2020   Procedure: LEFT SHOULDER ARTHROSCOPY WITH DEBRIDEMENT, DISTAL CLAVICAL EXCISION,  SUBACROMIAL DECOMPRESSION, BICEPS TENODESIS;  Surgeon: [name], MD;  Location: [facility];  Service: Orthopedics;  Laterality: Left;    There were no vitals filed for this visit.    Subjective Assessment - 10/11/20 1111    Subjective Patient reports that surgery on Rt shoulder was extensive. He is not to use Rt UE for any activities. Having difficulty with functional activities such as dressing and eating with Lt UE.    Diagnostic tests Message from AS:NKNL morning, Mr. Dugdale is scheduled to have PT with you tomorrow for his left shoulder. He had surgery on his right shoulder on Monday, 5/18. He had a large rotator cuff tear. We will hold off on any therapy for his right shoulder until 6 weeks after his surgery.  He is okay for right elbow, wrist, hand ROM. But NO ROM for the right shoulder. If you have any questions or concerns, please let me know! Thanks!    Patient Stated Goals use the Rt UE as normal.  HIking , build things, and shoot a gun and landscaping    Currently in Pain? Yes    Pain Score 2    with medication   Pain Location Shoulder    Pain Orientation Right    Pain Descriptors / Indicators Aching;Sharp    Pain Type Surgical pain;Chronic pain    Pain Radiating Towards at times into elbow    Pain Onset In the past 7 days    Pain Frequency Constant    Aggravating Factors  anything    Pain Relieving Factors meds; ice              [facility] PT Assessment - 10/11/20 0001      Assessment   Medical Diagnosis Rt/Lt RTC repair with bicep tendodesis and DCR    Referring Provider (PT) Dr Ramond Marrow    Onset Date/Surgical Date 10/08/20   pain for several years increased in the past 4-6 months   Hand Dominance Right    Next MD Visit 10/17/19    Prior Therapy not for shoulder      Precautions   Precautions Shoulder    Type of Shoulder Precautions no shoulder rehab for 6 weeks post op  Precaution Comments Await MD orders to proceed with shoulder rehab      Home Environment   Living Environment Private residence    Living Arrangements Spouse/significant other      Prior Function   Level of Independence Independent    Vocation Full time employment    Herbalist    Leisure yard work; dogs      Observation/Other Assessments   Observations patient in abduction sling Rt UE; bruising/discoloration noted through the Rt arm; steristrip in place Rt shoulder - note dried blood on steristrip with pinpoint fluid on shirt after vaso Rt shoulder - no active bleeding      Observation/Other Assessments-Edema    Edema --   significant edema Rt shoulder mild to moderate edeam into the arm and hand     Sensation   Additional Comments WFL's      Posture/Postural Control   Posture  Comments head forward; shoulders rounded and elevated      AROM   Right/Left Shoulder --   ROM not assessed Rt shoulder; ROM from last PT visit Lt shoulder   Left Shoulder Extension 60 Degrees    Left Shoulder Flexion 144 Degrees    Left Shoulder ABduction 142 Degrees    Left Shoulder External Rotation 90 Degrees    Right Forearm Pronation 90 Degrees    Right Forearm Supination 71 Degrees    Right/Left Wrist --   WFL's   Right/Left Finger --   Rt - full fist including fingers and thumb ROM   Cervical Flexion 55    Cervical Extension 63    Cervical - Right Side Bend 37    Cervical - Left Side Bend 35    Cervical - Right Rotation 64    Cervical - Left Rotation 62      PROM   PROM Assessment Site --   not assessed     Strength   Overall Strength Comments Rt UE not assessed resistively                      Objective measurements completed on examination: See above findings.       [facility] Adult PT Treatment/Exercise - 10/11/20 0001      Elbow Exercises   Elbow Flexion --   instructed in AROM forearm supination/pronation; wrist flexin/extension; fisting/opening hand     Vasopneumatic   Number Minutes Vasopneumatic  15 minutes    Vasopnuematic Location  Shoulder   Rt   Vasopneumatic Pressure Low    Vasopneumatic Temperature  34                  PT Education - 10/11/20 1257    Education Details HEP; POC; reviewed importance of not using, moving Rt UE until OK by Dr Everardo Pacific; to avoid lifting or strenuous activities with Lt UE    Person(s) Educated Patient    Methods Explanation;Demonstration;Tactile cues;Verbal cues    Comprehension Verbalized understanding;Returned demonstration;Verbal cues required;Tactile cues required            PT Short Term Goals - 10/11/20 1259      PT SHORT TERM GOAL #1   Title Instruct patient in AROM Rt forearm, wrist, hand    Time 4    Period Weeks    Status New    Target Date 10/11/20      PT SHORT TERM GOAL #2    Title begin Rt shoulder rehab as directed my Dr Everardo Pacific    Time  6    Period Weeks    Status New    Target Date 11/22/20      PT SHORT TERM GOAL #3   Title Independent in postural correction    Time 6    Period Weeks    Status New    Target Date 11/22/20      PT SHORT TERM GOAL #4   Title Independent in self dressing and grooming    Time 6    Period Weeks    Status New    Target Date 11/22/20             PT Long Term Goals - 10/11/20 1303      PT LONG TERM GOAL #1   Title independent with initial HEP    Time 12    Period Weeks    Status New    Target Date 01/03/21      PT LONG TERM GOAL #2   Title demo Lt shoulder strength =/> 4/5 to allow initial return to activities at work and in his yard    Time 12    Period Weeks    Status New    Target Date 01/03/21      PT LONG TERM GOAL #3   Title Rt shoulder ROM WFL's and painfree    Time 12    Period Weeks    Status New    Target Date 01/03/21      PT LONG TERM GOAL #4   Title patient reports ability to rest and sleep with minimal to no pain in Rt shoulder    Time 12    Period Weeks    Status New    Target Date 01/03/21      PT LONG TERM GOAL #5   Title Independent in all strengthening exercises and activities prior to d/c    Time 12    Period Weeks    Status New    Target Date 01/03/21                  Plan - 10/11/20 1132    Clinical Impression Statement Patient presents s/p Rt RCR 10/08/20 with more complex tendon repair than anticipated requiring more extensive surgery. He is to hold Rt shoulder rehab for 6 weeks. He can continue with elbow, wrist, hand exercises for Rt UE to work on ROM until he can begin shoulder surgery. Patient will wait until f/u appointment with MD to restart Lt UE strengthening.    Stability/Clinical Decision Making Stable/Uncomplicated    Clinical Decision Making Low    Rehab Potential Good    PT Frequency 2x / week    PT Duration 12 weeks    PT Treatment/Interventions  Iontophoresis 4mg /ml Dexamethasone;Taping;Vasopneumatic Device;Patient/family education;Moist Heat;Therapeutic activities;Passive range of motion;Dry needling;Therapeutic exercise;Cryotherapy;Electrical Stimulation;Neuromuscular re-education;Manual techniques    PT Next Visit Plan Progress with assessment and treatment of Rt shoulder as MD orders (see note from PA - no shoulder therapy for 6 weeks post op) Patient will work on elbow; wrist; hand ROM and await further MD ok to begin exercises. Patient will schedule for return to PT after his next MD appt. He will hold Lt UE strengthening for ~ 2 weeks to avoid irritation of Rt shoulder and begin with light resistive exercises when he adds Lt UE exercises. Continue with modalities as indicated. Progress with Rt shoulder rehab as directed    PT Home Exercise Plan [id number]    Consulted and Agree with Plan of Care Patient  Patient will benefit from skilled therapeutic intervention in order to improve the following deficits and impairments:  Decreased range of motion,Impaired UE functional use,Increased muscle spasms,Pain,Postural dysfunction,Decreased strength  Visit Diagnosis: Stiffness of right shoulder joint  Muscle weakness (generalized)  Chronic right shoulder pain  Localized edema  Stiffness of left shoulder, not elsewhere classified     Problem List Patient Active Problem List   Diagnosis Date Noted  . 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 Primary osteoarthritis involving multiple joints 07/26/2018  . Acute left ankle pain 07/26/2018  . Cerumen debris on tympanic membrane of right ear 07/26/2018  . Tinea pedis 07/21/2018  . Periscapular pain 07/20/2018  . Retrocalcaneal bursitis, left 07/20/2018  . Hepatitis B core antibody positive 07/20/2018  . Polyarthralgia 07/20/2018  . At risk for obstructive sleep apnea 07/20/2018  . Hearing loss due to cerumen impaction, right 11/02/2017  . Seasonal  allergic rhinitis due to pollen 11/02/2017  . Fasting hyperglycemia 08/06/2017  . Primary osteoarthritis of right hip 08/06/2017  . Chronic right-sided low back pain without sciatica 08/06/2017  . Hypertriglyceridemia 08/06/2017  . 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  . Gastroesophageal reflux disease without esophagitis 07/09/2017  . Hypertension goal BP (blood pressure) < 130/80 07/09/2017  . Class 2 obesity due to excess calories in adult 07/09/2017    Kamden Stanislaw Rober MinionP Cipriano Millikan PT, MPH  10/11/2020, 1:07 PM  [facility] [address] [place], [place], [address] Phone: [phone]   Fax:  [phone]  Name: Adrian [name] MRN: [id number] Date of Birth: 1961-01-20

## 2020-10-16 ENCOUNTER — Encounter: Payer: BC Managed Care – PPO | Admitting: Rehabilitative and Restorative Service Providers"

## 2020-10-16 DIAGNOSIS — M19011 Primary osteoarthritis, right shoulder: Secondary | ICD-10-CM | POA: Diagnosis not present

## 2020-10-18 ENCOUNTER — Encounter: Payer: BC Managed Care – PPO | Admitting: Physical Therapy

## 2020-10-23 ENCOUNTER — Encounter: Payer: BC Managed Care – PPO | Admitting: Rehabilitative and Restorative Service Providers"

## 2020-10-25 ENCOUNTER — Encounter: Payer: BC Managed Care – PPO | Admitting: Physical Therapy

## 2020-10-30 ENCOUNTER — Encounter: Payer: BC Managed Care – PPO | Admitting: Physical Therapy

## 2020-11-01 ENCOUNTER — Encounter: Payer: BC Managed Care – PPO | Admitting: Physical Therapy

## 2020-11-06 ENCOUNTER — Encounter: Payer: BC Managed Care – PPO | Admitting: Rehabilitative and Restorative Service Providers"

## 2020-11-08 ENCOUNTER — Encounter: Payer: BC Managed Care – PPO | Admitting: Rehabilitative and Restorative Service Providers"

## 2020-11-13 ENCOUNTER — Encounter: Payer: BC Managed Care – PPO | Admitting: Physical Therapy

## 2020-11-15 ENCOUNTER — Encounter: Payer: BC Managed Care – PPO | Admitting: Physical Therapy

## 2020-11-20 ENCOUNTER — Encounter: Payer: BC Managed Care – PPO | Admitting: Rehabilitative and Restorative Service Providers"

## 2020-11-22 ENCOUNTER — Encounter: Payer: Self-pay | Admitting: Rehabilitative and Restorative Service Providers"

## 2020-11-22 ENCOUNTER — Ambulatory Visit (INDEPENDENT_AMBULATORY_CARE_PROVIDER_SITE_OTHER): Payer: BC Managed Care – PPO | Admitting: Rehabilitative and Restorative Service Providers"

## 2020-11-22 ENCOUNTER — Other Ambulatory Visit: Payer: Self-pay

## 2020-11-22 DIAGNOSIS — R600 Localized edema: Secondary | ICD-10-CM | POA: Diagnosis not present

## 2020-11-22 DIAGNOSIS — M6281 Muscle weakness (generalized): Secondary | ICD-10-CM

## 2020-11-22 DIAGNOSIS — M25511 Pain in right shoulder: Secondary | ICD-10-CM | POA: Diagnosis not present

## 2020-11-22 DIAGNOSIS — M25611 Stiffness of right shoulder, not elsewhere classified: Secondary | ICD-10-CM | POA: Diagnosis not present

## 2020-11-22 DIAGNOSIS — G8929 Other chronic pain: Secondary | ICD-10-CM

## 2020-11-22 NOTE — Therapy (Signed)
[place], [place], [address] Phone: [phone]   Fax:  [phone]  Physical Therapy Treatment  Patient Details  Name: Adrian [name] MRN: [id number] Date of Birth: Mar 18, 1961 Referring Provider (PT): Dr Ophelia Charter   Encounter Date: 11/22/2020   PT End of Session - 11/22/20 1020     Visit Number 2    Number of Visits 24    Date for PT Re-Evaluation 02/14/21    Authorization Type BCBS    PT Start Time 1016    PT Stop Time 1107    PT Time Calculation (min) 51 min    Activity Tolerance Patient tolerated treatment well             Past Medical History:  Diagnosis Date   Chronic right hip pain 08/06/2017   Chronic right-sided low back pain without sciatica 08/06/2017   GERD (gastroesophageal reflux disease)    Hypertension    Hypertriglyceridemia 08/06/2017   Seasonal allergies     Past Surgical History:  Procedure Laterality Date   ESOPHAGOGASTRODUODENOSCOPY     NO PAST SURGERIES     SHOULDER ARTHROSCOPY WITH SUBACROMIAL DECOMPRESSION AND BICEP TENDON REPAIR Left 05/31/2020   Procedure: LEFT SHOULDER ARTHROSCOPY WITH DEBRIDEMENT, DISTAL CLAVICAL EXCISION,  SUBACROMIAL DECOMPRESSION, BICEPS TENODESIS;  Surgeon: Hiram Gash, MD;  Location: [place];  Service: Orthopedics;  Laterality: Left;    There were no vitals filed for this visit.   Subjective Assessment - 11/22/20 1021     Subjective Patient returns with protocol from MD. MD was not pleased that pt did not keep sling on when he was sitting at home.    Currently in Pain? No/denies    Pain Score 0-No pain    Pain Location Shoulder    Pain Orientation Right                [facility] PT Assessment - 11/22/20 0001       Assessment   Medical Diagnosis Rt/Lt RTC repair with bicep tendodesis and DCR    Referring Provider (PT) Dr Ophelia Charter    Onset Date/Surgical Date 10/08/20   pain for several years increased  in the past 4-6 months   Hand Dominance Right    Next MD Visit 01/01/21    Prior Therapy here for Lt shoulder      Precautions   Precautions Shoulder    Type of Shoulder Precautions per protocol    Precaution Comments per protocol      AROM   Right/Left Shoulder --   AROM Rt shoulder not tested   Left Shoulder Extension 60 Degrees    Left Shoulder Flexion 144 Degrees    Left Shoulder ABduction 142 Degrees    Left Shoulder Internal Rotation --   thumb T9   Left Shoulder External Rotation 90 Degrees    Right/Left Forearm --   WFL's   Right/Left Wrist --   WFL's   Right/Left Finger --   WFL's   Cervical Flexion 57    Cervical Extension 65    Cervical - Right Side Bend 39    Cervical - Left Side Bend 37    Cervical - Right Rotation 60    Cervical - Left Rotation 72      PROM   Right Shoulder Flexion 129 Degrees    Right Shoulder ABduction 74 Degrees   in scapular plane   Right Shoulder External Rotation 48 Degrees   in scapular  plane     Strength   Overall Strength Comments Rt UE not assessed resistively      Palpation   Palpation comment muscular tightness Rt shoulder irdle - pecs, upper trap, leveator; deltoid; biceps; periscapular musculature                           [facility] Adult PT Treatment/Exercise - 11/22/20 0001       Shoulder Exercises: Standing   Other Standing Exercises scap squeeze 5 sec hold x 10 noodle along spine; axial extension 10 sec x 5; lateral cervical flexion 10 sec x 5      Shoulder Exercises: Stretch   External Rotation Stretch 5 reps;10 seconds   standing with cane   Table Stretch - Flexion 5 reps;10 seconds   table slide     Vasopneumatic   Number Minutes Vasopneumatic  15 minutes    Vasopnuematic Location  Shoulder   Rt   Vasopneumatic Pressure Low    Vasopneumatic Temperature  34      Manual Therapy   Soft tissue mobilization anterior chest/pecs to anterior deltoid, biceps; upper trap    Scapular Mobilization Rt     Passive ROM Rt shoulder flexion; abduction in scapular plane; ER in scapular plane                    PT Education - 11/22/20 1038     Education Details HEP POC    Person(s) Educated Patient    Methods Explanation;Demonstration;Tactile cues;Verbal cues;Handout    Comprehension Verbalized understanding;Returned demonstration;Verbal cues required;Tactile cues required              PT Short Term Goals - 11/22/20 1059       PT SHORT TERM GOAL #1   Title Instruct patient in AROM Rt forearm, wrist, hand    Time 4    Period Weeks    Status Achieved      PT SHORT TERM GOAL #2   Title begin Rt shoulder rehab as directed my Dr Griffin Basil    Time 6    Period Weeks    Status Achieved      PT SHORT TERM GOAL #3   Title Independent in postural correction    Time 6    Period Weeks    Status On-going      PT SHORT TERM GOAL #4   Title Independent in self dressing and grooming    Time 6    Period Weeks    Status Achieved               PT Long Term Goals - 11/22/20 1100       PT LONG TERM GOAL #1   Title independent with initial HEP    Time 6    Period Weeks    Status On-going    Target Date 01/03/21      PT LONG TERM GOAL #2   Title demo Lt shoulder strength =/> 4/5 to allow initial return to activities at work and in his yard    Time 12    Period Weeks    Status New    Target Date 02/14/21      PT LONG TERM GOAL #3   Title Rt shoulder ROM WFL's and painfree    Time 12    Period Weeks    Status New    Target Date 02/14/21      PT LONG TERM GOAL #4  Title patient reports ability to rest and sleep with minimal to no pain in Rt shoulder    Time 12    Period Weeks    Status New    Target Date 02/14/21      PT LONG TERM GOAL #5   Title Independent in all strengthening exercises and activities prior to d/c    Time 12    Period Weeks    Status New    Target Date 02/14/21                   Plan - 11/22/20 1057     Clinical Impression  Statement Patient returns following MD visit with OK from Dr Griffin Basil to begin Rt shoulder rehab. He is now 6 wks 3 days post surgery. Patient has poor posture and alignment; limited AROM/PROM/strength/functional activities Rt UE following extensive REt shoulder surgery with RCR, DCR, SAD, biceps tenodesis 10/08/20. He will benefit from PT to address problems identified.    Rehab Potential Good    PT Frequency 2x / week    PT Duration 12 weeks    PT Treatment/Interventions Iontophoresis 64m/ml Dexamethasone;Taping;Vasopneumatic Device;Patient/family education;Moist Heat;Therapeutic activities;Passive range of motion;Dry needling;Therapeutic exercise;Cryotherapy;Electrical Stimulation;Neuromuscular re-education;Manual techniques    PT Next Visit Plan Progress with Rt shoulder rehab per MD protocol    PT Home Exercise Plan [id number]    Consulted and Agree with Plan of Care Patient             Patient will benefit from skilled therapeutic intervention in order to improve the following deficits and impairments:     Visit Diagnosis: Stiffness of right shoulder joint  Muscle weakness (generalized)  Chronic right shoulder pain  Localized edema     Problem List Patient Active Problem List   Diagnosis Date Noted   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 04/04/2020   Primary osteoarthritis involving multiple joints 07/26/2018   Acute left ankle pain 07/26/2018   Cerumen debris on tympanic membrane of right ear 07/26/2018   Tinea pedis 07/21/2018   Periscapular pain 07/20/2018   Retrocalcaneal bursitis, left 07/20/2018   Hepatitis B core antibody positive 07/20/2018   Polyarthralgia 07/20/2018   At risk for obstructive sleep apnea 07/20/2018   Hearing loss due to cerumen impaction, right 11/02/2017   Seasonal allergic rhinitis due to pollen 11/02/2017   Fasting hyperglycemia 08/06/2017   Primary osteoarthritis of right hip 08/06/2017   Chronic right-sided low back pain without  sciatica 08/06/2017   Hypertriglyceridemia 08/06/2017   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   Gastroesophageal reflux disease without esophagitis 07/09/2017   Hypertension goal BP (blood pressure) < 130/80 07/09/2017   Class 2 obesity due to excess calories in adult 07/09/2017    Quantrell Splitt PNilda SimmerPT, MPH  11/22/2020, 11:02 AM  [facility] [place] [address]Phone: [phone]  Fax:  [phone] Name: Adrian GARCIAMARTINEZMRN: [id number] of Birth: 907/25/1962

## 2020-11-22 NOTE — Patient Instructions (Signed)
Pendulum Scap squeeze  Chin tuck  Tip left ear to left shoulder - hold 10 sec x 5 reps  Backward shoulder rolls   Standing back along noodle holding cane with elbows at 90 deg pushing Rt hand out to side 10 sec x 5 to 10 reps  Table slide 10 sec x 10

## 2020-11-27 ENCOUNTER — Ambulatory Visit (INDEPENDENT_AMBULATORY_CARE_PROVIDER_SITE_OTHER): Payer: BC Managed Care – PPO | Admitting: Rehabilitative and Restorative Service Providers"

## 2020-11-27 ENCOUNTER — Encounter: Payer: Self-pay | Admitting: Rehabilitative and Restorative Service Providers"

## 2020-11-27 ENCOUNTER — Other Ambulatory Visit: Payer: Self-pay

## 2020-11-27 DIAGNOSIS — M25611 Stiffness of right shoulder, not elsewhere classified: Secondary | ICD-10-CM

## 2020-11-27 DIAGNOSIS — R600 Localized edema: Secondary | ICD-10-CM | POA: Diagnosis not present

## 2020-11-27 DIAGNOSIS — M25511 Pain in right shoulder: Secondary | ICD-10-CM

## 2020-11-27 DIAGNOSIS — G8929 Other chronic pain: Secondary | ICD-10-CM

## 2020-11-27 DIAGNOSIS — M6281 Muscle weakness (generalized): Secondary | ICD-10-CM

## 2020-11-27 NOTE — Therapy (Signed)
[facility] [place] [address] [place] [place], [place], [address] Phone: [phone]   Fax:  [phone]  Physical Therapy Treatment  Patient Details  Name: Adrian [name] MRN: [id number] Date of Birth: Feb 07, 1961 Referring Provider (PT): Dr Ramond Marrow   Encounter Date: 11/27/2020   PT End of Session - 11/27/20 0942     Visit Number 3    Number of Visits 24    Date for PT Re-Evaluation 02/14/21    Authorization Type BCBS    PT Start Time 0935    PT Stop Time 1028    PT Time Calculation (min) 53 min    Activity Tolerance Patient tolerated treatment well             Past Medical History:  Diagnosis Date   Chronic right hip pain 08/06/2017   Chronic right-sided low back pain without sciatica 08/06/2017   GERD (gastroesophageal reflux disease)    Hypertension    Hypertriglyceridemia 08/06/2017   Seasonal allergies     Past Surgical History:  Procedure Laterality Date   ESOPHAGOGASTRODUODENOSCOPY     NO PAST SURGERIES     SHOULDER ARTHROSCOPY WITH SUBACROMIAL DECOMPRESSION AND BICEP TENDON REPAIR Left 05/31/2020   Procedure: LEFT SHOULDER ARTHROSCOPY WITH DEBRIDEMENT, DISTAL CLAVICAL EXCISION,  SUBACROMIAL DECOMPRESSION, BICEPS TENODESIS;  Surgeon: [name], MD;  Location: [facility];  Service: Orthopedics;  Laterality: Left;    There were no vitals filed for this visit.   Subjective Assessment - 11/27/20 0943     Subjective Travelling over the weekend and did well. He had a little pain last night and when he qwoke but none now. Has not been working on his exercises over the weekend.    Currently in Pain? No/denies    Pain Score 0-No pain    Pain Location Shoulder                [facility] PT Assessment - 11/27/20 0001       Assessment   Medical Diagnosis Rt/Lt RTC repair with bicep tendodesis and DCR    Referring Provider (PT) Dr Ramond Marrow    Onset Date/Surgical Date 10/08/20   pain for several  years increased in the past 4-6 months   Hand Dominance Right    Next MD Visit 01/01/21    Prior Therapy here for Lt shoulder      Precautions   Precautions Shoulder    Type of Shoulder Precautions per protocol    Precaution Comments per protocol      AROM   Right Shoulder Flexion 123 Degrees   AAROM supine with cane   Right Shoulder External Rotation 72 Degrees   AAROM standing with cane no pain                          [facility] Adult PT Treatment/Exercise - 11/27/20 0001       Shoulder Exercises: Supine   Flexion AAROM;Right;5 reps    Other Supine Exercises scap squeeze 5 sec hold x 10      Shoulder Exercises: Standing   Other Standing Exercises scap squeeze 5 sec hold x 10 noodle along spine; axial extension 10 sec x 5; lateral cervical flexion 10 sec x 5      Shoulder Exercises: Pulleys   Flexion --   10 reps 10 sec hold to pt tolerance no pain     Shoulder Exercises: Stretch   External Rotation Stretch 5  reps;10 seconds   standing with cane   Table Stretch - Flexion 5 reps;10 seconds   table slide     Vasopneumatic   Number Minutes Vasopneumatic  15 minutes    Vasopnuematic Location  Shoulder   Rt   Vasopneumatic Pressure Low    Vasopneumatic Temperature  34      Manual Therapy   Soft tissue mobilization anterior chest/pecs to anterior deltoid, biceps; upper trap    Scapular Mobilization Rt    Passive ROM Rt shoulder flexion; abduction in scapular plane; ER in scapular plane                      PT Short Term Goals - 11/22/20 1059       PT SHORT TERM GOAL #1   Title Instruct patient in AROM Rt forearm, wrist, hand    Time 4    Period Weeks    Status Achieved      PT SHORT TERM GOAL #2   Title begin Rt shoulder rehab as directed my Dr Everardo Pacific    Time 6    Period Weeks    Status Achieved      PT SHORT TERM GOAL #3   Title Independent in postural correction    Time 6    Period Weeks    Status On-going      PT SHORT TERM GOAL  #4   Title Independent in self dressing and grooming    Time 6    Period Weeks    Status Achieved               PT Long Term Goals - 11/22/20 1100       PT LONG TERM GOAL #1   Title independent with initial HEP    Time 6    Period Weeks    Status On-going    Target Date 01/03/21      PT LONG TERM GOAL #2   Title demo Lt shoulder strength =/> 4/5 to allow initial return to activities at work and in his yard    Time 12    Period Weeks    Status New    Target Date 02/14/21      PT LONG TERM GOAL #3   Title Rt shoulder ROM WFL's and painfree    Time 12    Period Weeks    Status New    Target Date 02/14/21      PT LONG TERM GOAL #4   Title patient reports ability to rest and sleep with minimal to no pain in Rt shoulder    Time 12    Period Weeks    Status New    Target Date 02/14/21      PT LONG TERM GOAL #5   Title Independent in all strengthening exercises and activities prior to d/c    Time 12    Period Weeks    Status New    Target Date 02/14/21                   Plan - 11/27/20 0953     Clinical Impression Statement Progressing with AAROM Rt shoulder without difficulty. Good gains in PROM. Progressing well with shoulder rehab    Rehab Potential Good    PT Frequency 2x / week    PT Duration 12 weeks    PT Treatment/Interventions Iontophoresis 4mg /ml Dexamethasone;Taping;Vasopneumatic Device;Patient/family education;Moist Heat;Therapeutic activities;Passive range of motion;Dry needling;Therapeutic exercise;Cryotherapy;Electrical Stimulation;Neuromuscular re-education;Manual techniques  PT Next Visit Plan Progress with Rt shoulder rehab per MD protocol    PT Home Exercise Plan [id number] - patient will refer to previous exercises does not want reprinted exercises    Consulted and Agree with Plan of Care Patient             Patient will benefit from skilled therapeutic intervention in order to improve the following deficits and impairments:      Visit Diagnosis: Stiffness of right shoulder joint  Muscle weakness (generalized)  Chronic right shoulder pain  Localized edema     Problem List Patient Active Problem List   Diagnosis Date Noted   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 04/04/2020   Primary osteoarthritis involving multiple joints 07/26/2018   Acute left ankle pain 07/26/2018   Cerumen debris on tympanic membrane of right ear 07/26/2018   Tinea pedis 07/21/2018   Periscapular pain 07/20/2018   Retrocalcaneal bursitis, left 07/20/2018   Hepatitis B core antibody positive 07/20/2018   Polyarthralgia 07/20/2018   At risk for obstructive sleep apnea 07/20/2018   Hearing loss due to cerumen impaction, right 11/02/2017   Seasonal allergic rhinitis due to pollen 11/02/2017   Fasting hyperglycemia 08/06/2017   Primary osteoarthritis of right hip 08/06/2017   Chronic right-sided low back pain without sciatica 08/06/2017   Hypertriglyceridemia 08/06/2017   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   Gastroesophageal reflux disease without esophagitis 07/09/2017   Hypertension goal BP (blood pressure) < 130/80 07/09/2017   Class 2 obesity due to excess calories in adult 07/09/2017    Jonnell Hentges Rober Minion PT, MPH  11/27/2020, 10:13 AM  [facility] [address] [place], [place], [address] Phone: [phone]   Fax:  [phone]  Name: Adrian [name] MRN: [id number] Date of Birth: 07/26/1960

## 2020-11-29 ENCOUNTER — Encounter: Payer: Self-pay | Admitting: Rehabilitative and Restorative Service Providers"

## 2020-11-29 ENCOUNTER — Other Ambulatory Visit: Payer: Self-pay

## 2020-11-29 ENCOUNTER — Ambulatory Visit: Payer: BC Managed Care – PPO | Admitting: Rehabilitative and Restorative Service Providers"

## 2020-11-29 DIAGNOSIS — M25511 Pain in right shoulder: Secondary | ICD-10-CM | POA: Diagnosis not present

## 2020-11-29 DIAGNOSIS — M6281 Muscle weakness (generalized): Secondary | ICD-10-CM

## 2020-11-29 DIAGNOSIS — R600 Localized edema: Secondary | ICD-10-CM

## 2020-11-29 DIAGNOSIS — G8929 Other chronic pain: Secondary | ICD-10-CM

## 2020-11-29 DIAGNOSIS — M25611 Stiffness of right shoulder, not elsewhere classified: Secondary | ICD-10-CM

## 2020-11-29 NOTE — Therapy (Signed)
[facility] [place] [address] [place], [place], [address] Phone: [phone]   Fax:  [phone]  Physical Therapy Treatment  Patient Details  Name: Adrian [name] MRN: [id number] Date of Birth: 1960/10/02 Referring Provider (PT): Dr Ramond Marrow   Encounter Date: 11/29/2020   PT End of Session - 11/29/20 1108     Visit Number 4    Date for PT Re-Evaluation 02/14/21    Authorization Type BCBS    PT Start Time 1058    PT Stop Time 1147    PT Time Calculation (min) 49 min    Activity Tolerance Patient tolerated treatment well             Past Medical History:  Diagnosis Date   Chronic right hip pain 08/06/2017   Chronic right-sided low back pain without sciatica 08/06/2017   GERD (gastroesophageal reflux disease)    Hypertension    Hypertriglyceridemia 08/06/2017   Seasonal allergies     Past Surgical History:  Procedure Laterality Date   ESOPHAGOGASTRODUODENOSCOPY     NO PAST SURGERIES     SHOULDER ARTHROSCOPY WITH SUBACROMIAL DECOMPRESSION AND BICEP TENDON REPAIR Left 05/31/2020   Procedure: LEFT SHOULDER ARTHROSCOPY WITH DEBRIDEMENT, DISTAL CLAVICAL EXCISION,  SUBACROMIAL DECOMPRESSION, BICEPS TENODESIS;  Surgeon: [name], MD;  Location: [facility];  Service: Orthopedics;  Laterality: Left;    There were no vitals filed for this visit.   Subjective Assessment - 11/29/20 1108     Subjective Some soreness and discomfort when he accidently uses his Rt UE for anything.    Currently in Pain? No/denies                               [facility] Adult PT Treatment/Exercise - 11/29/20 0001       Shoulder Exercises: Supine   Flexion AAROM;Right;10 reps    Flexion Limitations using cane    Other Supine Exercises scap squeeze 5 sec hold x 10      Shoulder Exercises: Standing   Other Standing Exercises scap squeeze 5 sec hold x 10 noodle along spine; axial extension 10 sec x 5;  lateral cervical flexion 10 sec x 5      Shoulder Exercises: Pulleys   Flexion --   10 reps 10 sec hold to pt tolerance no pain   Scaption 1 minute    Other Pulley Exercises gentle ab/add at ~ 80 deg      Shoulder Exercises: Stretch   External Rotation Stretch 5 reps;10 seconds   standing with cane   Table Stretch - Flexion 5 reps;10 seconds   table slide     Vasopneumatic   Number Minutes Vasopneumatic  10 minutes    Vasopnuematic Location  Shoulder   Rt   Vasopneumatic Pressure Low    Vasopneumatic Temperature  34      Manual Therapy   Soft tissue mobilization anterior chest/pecs to anterior deltoid, biceps; upper trap    Scapular Mobilization Rt    Passive ROM Rt shoulder flexion; abduction in scapular plane; ER in scapular plane; shoulder extension off edge of table for gentle stretch anterior shoulder - no pain                    PT Education - 11/29/20 1126     Education Details HEP    Person(s) Educated Patient    Methods Explanation;Demonstration;Tactile cues;Verbal cues  Comprehension Verbalized understanding;Returned demonstration;Verbal cues required;Tactile cues required              PT Short Term Goals - 11/22/20 1059       PT SHORT TERM GOAL #1   Title Instruct patient in AROM Rt forearm, wrist, hand    Time 4    Period Weeks    Status Achieved      PT SHORT TERM GOAL #2   Title begin Rt shoulder rehab as directed my Dr Everardo Pacific    Time 6    Period Weeks    Status Achieved      PT SHORT TERM GOAL #3   Title Independent in postural correction    Time 6    Period Weeks    Status On-going      PT SHORT TERM GOAL #4   Title Independent in self dressing and grooming    Time 6    Period Weeks    Status Achieved               PT Long Term Goals - 11/22/20 1100       PT LONG TERM GOAL #1   Title independent with initial HEP    Time 6    Period Weeks    Status On-going    Target Date 01/03/21      PT LONG TERM GOAL #2    Title demo Lt shoulder strength =/> 4/5 to allow initial return to activities at work and in his yard    Time 12    Period Weeks    Status New    Target Date 02/14/21      PT LONG TERM GOAL #3   Title Rt shoulder ROM WFL's and painfree    Time 12    Period Weeks    Status New    Target Date 02/14/21      PT LONG TERM GOAL #4   Title patient reports ability to rest and sleep with minimal to no pain in Rt shoulder    Time 12    Period Weeks    Status New    Target Date 02/14/21      PT LONG TERM GOAL #5   Title Independent in all strengthening exercises and activities prior to d/c    Time 12    Period Weeks    Status New    Target Date 02/14/21                   Plan - 11/29/20 1109     Clinical Impression Statement Continue with AAROM and PROM Rt shoulder. Added isometrics for shoulder ext, abd.    Rehab Potential Good    PT Frequency 2x / week    PT Duration 12 weeks    PT Treatment/Interventions Iontophoresis 4mg /ml Dexamethasone;Taping;Vasopneumatic Device;Patient/family education;Moist Heat;Therapeutic activities;Passive range of motion;Dry needling;Therapeutic exercise;Cryotherapy;Electrical Stimulation;Neuromuscular re-education;Manual techniques    PT Next Visit Plan Progress with Rt shoulder rehab per MD protocol Trial of active exercises next week    PT Home Exercise Plan [id number] - patient will refer to previous exercises does not want reprinted exercises    Consulted and Agree with Plan of Care Patient             Patient will benefit from skilled therapeutic intervention in order to improve the following deficits and impairments:     Visit Diagnosis: Stiffness of right shoulder joint  Muscle weakness (generalized)  Chronic right shoulder pain  Localized edema  Problem List Patient Active Problem List   Diagnosis Date Noted   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 04/04/2020   Primary osteoarthritis involving multiple  joints 07/26/2018   Acute left ankle pain 07/26/2018   Cerumen debris on tympanic membrane of right ear 07/26/2018   Tinea pedis 07/21/2018   Periscapular pain 07/20/2018   Retrocalcaneal bursitis, left 07/20/2018   Hepatitis B core antibody positive 07/20/2018   Polyarthralgia 07/20/2018   At risk for obstructive sleep apnea 07/20/2018   Hearing loss due to cerumen impaction, right 11/02/2017   Seasonal allergic rhinitis due to pollen 11/02/2017   Fasting hyperglycemia 08/06/2017   Primary osteoarthritis of right hip 08/06/2017   Chronic right-sided low back pain without sciatica 08/06/2017   Hypertriglyceridemia 08/06/2017   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   Gastroesophageal reflux disease without esophagitis 07/09/2017   Hypertension goal BP (blood pressure) < 130/80 07/09/2017   Class 2 obesity due to excess calories in adult 07/09/2017    Anice Wilshire Rober Minion PT, MPH  11/29/2020, 11:41 AM  [facility] [address] [place], [place], [address] Phone: [phone]   Fax:  [phone]  Name: Adrian [name] MRN: [id number] Date of Birth: [birth year]

## 2020-11-29 NOTE — Patient Instructions (Signed)
Shoulder isometrics abd, ext, flex (no rotation)

## 2020-12-04 ENCOUNTER — Ambulatory Visit: Payer: BC Managed Care – PPO | Admitting: Rehabilitative and Restorative Service Providers"

## 2020-12-04 ENCOUNTER — Encounter: Payer: Self-pay | Admitting: Rehabilitative and Restorative Service Providers"

## 2020-12-04 ENCOUNTER — Other Ambulatory Visit: Payer: Self-pay

## 2020-12-04 DIAGNOSIS — M6281 Muscle weakness (generalized): Secondary | ICD-10-CM

## 2020-12-04 DIAGNOSIS — M25611 Stiffness of right shoulder, not elsewhere classified: Secondary | ICD-10-CM | POA: Diagnosis not present

## 2020-12-04 DIAGNOSIS — G8929 Other chronic pain: Secondary | ICD-10-CM

## 2020-12-04 DIAGNOSIS — R600 Localized edema: Secondary | ICD-10-CM | POA: Diagnosis not present

## 2020-12-04 DIAGNOSIS — M25511 Pain in right shoulder: Secondary | ICD-10-CM

## 2020-12-04 NOTE — Therapy (Signed)
[facility] [place] [address] [place] [address] [place], [place], [address] Phone: [phone]   Fax:  [phone]  Physical Therapy Treatment  Patient Details  Name: Adrian [name] MRN: [id number] Date of Birth: [birth year] Referring Provider (PT): Dr Ramond Marrow   Encounter Date: 12/04/2020   PT End of Session - 12/04/20 1022     Visit Number 5    Number of Visits 24    Date for PT Re-Evaluation 02/14/21    Authorization Type BCBS    PT Start Time 1017    PT Stop Time 1108    PT Time Calculation (min) 51 min    Activity Tolerance Patient tolerated treatment well             Past Medical History:  Diagnosis Date   Chronic right hip pain 08/06/2017   Chronic right-sided low back pain without sciatica 08/06/2017   GERD (gastroesophageal reflux disease)    Hypertension    Hypertriglyceridemia 08/06/2017   Seasonal allergies     Past Surgical History:  Procedure Laterality Date   ESOPHAGOGASTRODUODENOSCOPY     NO PAST SURGERIES     SHOULDER ARTHROSCOPY WITH SUBACROMIAL DECOMPRESSION AND BICEP TENDON REPAIR Left 05/31/2020   Procedure: LEFT SHOULDER ARTHROSCOPY WITH DEBRIDEMENT, DISTAL CLAVICAL EXCISION,  SUBACROMIAL DECOMPRESSION, BICEPS TENODESIS;  Surgeon: [name], MD;  Location: [facility];  Service: Orthopedics;  Laterality: Left;    There were no vitals filed for this visit.   Subjective Assessment - 12/04/20 1017     Subjective The patient has some soreness when reaching across his body to touch his left shoulder (we reviewed that he should not be performing this motion).  He has 1-2/10 pain R superior/anterior shoulder.  He also accidentally lifted a pitcher of tea yesterday with the R UE and got immediate pain/weakness.    Patient Stated Goals use the Rt UE as normal.  HIking , build things, and shoot a gun and landscaping    Currently in Pain? Yes    Pain Score 2     Pain Location Shoulder    Pain  Orientation Right    Pain Descriptors / Indicators Aching;Sore    Pain Type Chronic pain;Surgical pain    Pain Onset More than a month ago    Pain Frequency Intermittent    Aggravating Factors  varies    Pain Relieving Factors varies                [facility] PT Assessment - 12/04/20 1023       Assessment   Medical Diagnosis Rt/Lt RTC repair with bicep tendodesis and DCR    Referring Provider (PT) Dr Ramond Marrow    Onset Date/Surgical Date 10/08/20    Hand Dominance Right      Precautions   Precautions Shoulder    Type of Shoulder Precautions per protocol    Precaution Comments per protocol      PROM   Right Shoulder Flexion 135 Degrees    Right Shoulder External Rotation 52 Degrees                           [facility] Adult PT Treatment/Exercise - 12/04/20 1024       Exercises   Exercises Shoulder      Shoulder Exercises: Supine   External Rotation PROM;Right;10 reps    External Rotation Limitations arm at neutral    Flexion AAROM;Right;10 reps    Flexion  Limitations using cane      Shoulder Exercises: Seated   Flexion AAROM;Right;5 reps    Flexion Limitations initiated minimal AAROM in sitting with arm supported on inclined mat table      Shoulder Exercises: Sidelying   Other Sidelying Exercises scapular retraction x 5 reps with tactile cues/ myofascial stretch for R cervical musculature    Other Sidelying Exercises scapular elevation depression and protraction/retraction x 6 reps each      Shoulder Exercises: Standing   Flexion AAROM;10 reps    Flexion Limitations with R arm supported on the ball and L arm performing gentle flexion to R side    Other Standing Exercises standing scap squeeze x 5 reps      Shoulder Exercises: Pulleys   Flexion --   10 reps within tissue tolerance holding 5 seconds   Scaption 1 minute      Shoulder Exercises: Isometric Strengthening   ABduction Supine;3X5"    ADduction Supine;3X5"    ADduction Limitations cues  to gently bring elbow to trunk while pssively supported      Shoulder Exercises: Stretch   Table Stretch -Flexion Limitations *used physioball for standing flexion      Vasopneumatic   Number Minutes Vasopneumatic  10 minutes    Vasopnuematic Location  Shoulder    Vasopneumatic Pressure Low    Vasopneumatic Temperature  34      Manual Therapy   Manual Therapy Soft tissue mobilization;Joint mobilization;Passive ROM;Scapular mobilization    Manual therapy comments to reduce muscle guarding and improve ROM    Joint Mobilization gentle grade I distraction for muscle relaxation    Soft tissue mobilization anterior chest/pecs, middle deltoid    Scapular Mobilization L sidelying, R parascapular STM and PROM into protraction/retraction    Passive ROM R shoulder flexion, ER                      PT Short Term Goals - 11/22/20 1059       PT SHORT TERM GOAL #1   Title Instruct patient in AROM Rt forearm, wrist, hand    Time 4    Period Weeks    Status Achieved      PT SHORT TERM GOAL #2   Title begin Rt shoulder rehab as directed my Dr Everardo Pacific    Time 6    Period Weeks    Status Achieved      PT SHORT TERM GOAL #3   Title Independent in postural correction    Time 6    Period Weeks    Status On-going      PT SHORT TERM GOAL #4   Title Independent in self dressing and grooming    Time 6    Period Weeks    Status Achieved               PT Long Term Goals - 11/22/20 1100       PT LONG TERM GOAL #1   Title independent with initial HEP    Time 6    Period Weeks    Status On-going    Target Date 01/03/21      PT LONG TERM GOAL #2   Title demo Lt shoulder strength =/> 4/5 to allow initial return to activities at work and in his yard    Time 12    Period Weeks    Status New    Target Date 02/14/21      PT LONG TERM GOAL #3  Title Rt shoulder ROM WFL's and painfree    Time 12    Period Weeks    Status New    Target Date 02/14/21      PT LONG TERM  GOAL #4   Title patient reports ability to rest and sleep with minimal to no pain in Rt shoulder    Time 12    Period Weeks    Status New    Target Date 02/14/21      PT LONG TERM GOAL #5   Title Independent in all strengthening exercises and activities prior to d/c    Time 12    Period Weeks    Status New    Target Date 02/14/21                   Plan - 12/04/20 1023     Clinical Impression Statement The patient responds well to gentle passive stretching today to reduce discomfort in anterior/superior shoulder.  He has pain when he uses active contraction during AAROM with R shoulder.  Progress per protocol and tissue tolerance.    PT Treatment/Interventions Iontophoresis 4mg /ml Dexamethasone;Taping;Vasopneumatic Device;Patient/family education;Moist Heat;Therapeutic activities;Passive range of motion;Dry needling;Therapeutic exercise;Cryotherapy;Electrical Stimulation;Neuromuscular re-education;Manual techniques    PT Next Visit Plan Progress with Rt shoulder rehab per MD protocol Trial of active exercises next week    PT Home Exercise Plan [id number] - patient will refer to previous exercises does not want reprinted exercises    Consulted and Agree with Plan of Care Patient             Patient will benefit from skilled therapeutic intervention in order to improve the following deficits and impairments:     Visit Diagnosis: Stiffness of right shoulder joint  Muscle weakness (generalized)  Chronic right shoulder pain  Localized edema     Problem List Patient Active Problem List   Diagnosis Date Noted   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 04/04/2020   Primary osteoarthritis involving multiple joints 07/26/2018   Acute left ankle pain 07/26/2018   Cerumen debris on tympanic membrane of right ear 07/26/2018   Tinea pedis 07/21/2018   Periscapular pain 07/20/2018   Retrocalcaneal bursitis, left 07/20/2018   Hepatitis B core antibody positive  07/20/2018   Polyarthralgia 07/20/2018   At risk for obstructive sleep apnea 07/20/2018   Hearing loss due to cerumen impaction, right 11/02/2017   Seasonal allergic rhinitis due to pollen 11/02/2017   Fasting hyperglycemia 08/06/2017   Primary osteoarthritis of right hip 08/06/2017   Chronic right-sided low back pain without sciatica 08/06/2017   Hypertriglyceridemia 08/06/2017   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   Gastroesophageal reflux disease without esophagitis 07/09/2017   Hypertension goal BP (blood pressure) < 130/80 07/09/2017   Class 2 obesity due to excess calories in adult 07/09/2017    Lavaya Defreitas, PT 12/04/2020, 4:04 PM  [facility] [address] [place], [place], [place] Phone: [phone]   Fax:  [phone]  Name: Adrian [name] MRN: Frankey [id number] Date of Birth: 18-Mar-1961

## 2020-12-06 ENCOUNTER — Ambulatory Visit: Payer: BC Managed Care – PPO | Admitting: Rehabilitative and Restorative Service Providers"

## 2020-12-06 ENCOUNTER — Other Ambulatory Visit: Payer: Self-pay

## 2020-12-06 ENCOUNTER — Encounter: Payer: Self-pay | Admitting: Rehabilitative and Restorative Service Providers"

## 2020-12-06 DIAGNOSIS — R600 Localized edema: Secondary | ICD-10-CM | POA: Diagnosis not present

## 2020-12-06 DIAGNOSIS — G8929 Other chronic pain: Secondary | ICD-10-CM

## 2020-12-06 DIAGNOSIS — M25611 Stiffness of right shoulder, not elsewhere classified: Secondary | ICD-10-CM | POA: Diagnosis not present

## 2020-12-06 DIAGNOSIS — M6281 Muscle weakness (generalized): Secondary | ICD-10-CM

## 2020-12-06 DIAGNOSIS — M25511 Pain in right shoulder: Secondary | ICD-10-CM

## 2020-12-06 NOTE — Therapy (Signed)
[facility] [place] [address] [place], [place], [address] Phone: [phone]   Fax:  [phone]  Physical Therapy Treatment  Patient Details  Name: Adrian [name] MRN: [id number] Date of Birth: August 19, 1960 Referring Provider (PT): Dr Ramond Marrow   Encounter Date: 12/06/2020   PT End of Session - 12/06/20 1018     Visit Number 6    Number of Visits 24    Date for PT Re-Evaluation 02/14/21    Authorization Type BCBS    PT Start Time 1015    PT Stop Time 1109    PT Time Calculation (min) 54 min    Activity Tolerance Patient tolerated treatment well             Past Medical History:  Diagnosis Date   Chronic right hip pain 08/06/2017   Chronic right-sided low back pain without sciatica 08/06/2017   GERD (gastroesophageal reflux disease)    Hypertension    Hypertriglyceridemia 08/06/2017   Seasonal allergies     Past Surgical History:  Procedure Laterality Date   ESOPHAGOGASTRODUODENOSCOPY     NO PAST SURGERIES     SHOULDER ARTHROSCOPY WITH SUBACROMIAL DECOMPRESSION AND BICEP TENDON REPAIR Left 05/31/2020   Procedure: LEFT SHOULDER ARTHROSCOPY WITH DEBRIDEMENT, DISTAL CLAVICAL EXCISION,  SUBACROMIAL DECOMPRESSION, BICEPS TENODESIS;  Surgeon: [name], MD;  Location: [facility];  Service: Orthopedics;  Laterality: Left;    There were no vitals filed for this visit.   Subjective Assessment - 12/06/20 1019     Subjective Patient reports that he continues to have some soreness and discomfort in the Rt shoulder.Trying not to use the Rt UE but that's hard.    Currently in Pain? No/denies    Pain Score 0-No pain                               [facility] Adult PT Treatment/Exercise - 12/06/20 0001       Shoulder Exercises: Standing   Other Standing Exercises standing scap squeeze x 10 reps 5-10 sec hold      Shoulder Exercises: Pulleys   Flexion --   10 reps within tissue tolerance  holding 10 seconds   Scaption --   10 reps 10 sec hold   Other Pulley Exercises gentle ab/add at ~ 80 deg      Shoulder Exercises: Isometric Strengthening   Flexion 5X5"    ABduction 5X5"    ADduction 5X5"      Shoulder Exercises: Stretch   External Rotation Stretch 10 seconds   10 reps; standing with cane - elbow 90 deg at side     Vasopneumatic   Number Minutes Vasopneumatic  10 minutes    Vasopnuematic Location  Shoulder    Vasopneumatic Pressure Low    Vasopneumatic Temperature  34      Manual Therapy   Manual Therapy Soft tissue mobilization;Joint mobilization;Passive ROM;Scapular mobilization    Manual therapy comments to reduce muscle guarding and improve ROM    Joint Mobilization gentle grade I distraction for muscle relaxation    Soft tissue mobilization anterior chest/pecs, middle deltoid; biceps; upper trap    Scapular Mobilization Rt    Passive ROM R shoulder flexion, scaption in scapular plane; ER in scapular plane                      PT Short Term Goals - 11/22/20 1059  PT SHORT TERM GOAL #1   Title Instruct patient in AROM Rt forearm, wrist, hand    Time 4    Period Weeks    Status Achieved      PT SHORT TERM GOAL #2   Title begin Rt shoulder rehab as directed my Dr Everardo Pacific    Time 6    Period Weeks    Status Achieved      PT SHORT TERM GOAL #3   Title Independent in postural correction    Time 6    Period Weeks    Status On-going      PT SHORT TERM GOAL #4   Title Independent in self dressing and grooming    Time 6    Period Weeks    Status Achieved               PT Long Term Goals - 11/22/20 1100       PT LONG TERM GOAL #1   Title independent with initial HEP    Time 6    Period Weeks    Status On-going    Target Date 01/03/21      PT LONG TERM GOAL #2   Title demo Lt shoulder strength =/> 4/5 to allow initial return to activities at work and in his yard    Time 12    Period Weeks    Status New    Target  Date 02/14/21      PT LONG TERM GOAL #3   Title Rt shoulder ROM WFL's and painfree    Time 12    Period Weeks    Status New    Target Date 02/14/21      PT LONG TERM GOAL #4   Title patient reports ability to rest and sleep with minimal to no pain in Rt shoulder    Time 12    Period Weeks    Status New    Target Date 02/14/21      PT LONG TERM GOAL #5   Title Independent in all strengthening exercises and activities prior to d/c    Time 12    Period Weeks    Status New    Target Date 02/14/21                   Plan - 12/06/20 1021     Clinical Impression Statement Continues to progress with rehab per protocol. Trial of AAROM and AROM exercises - add RC isometrics UE at side; tria lof active flexion below 90 deg and ER to 40-50 deg. Progress exercise gradually. Patient now 8 wk 3 days post extensive RCR    Rehab Potential Good    PT Frequency 2x / week    PT Duration 12 weeks    PT Treatment/Interventions Iontophoresis 4mg /ml Dexamethasone;Taping;Vasopneumatic Device;Patient/family education;Moist Heat;Therapeutic activities;Passive range of motion;Dry needling;Therapeutic exercise;Cryotherapy;Electrical Stimulation;Neuromuscular re-education;Manual techniques    PT Next Visit Plan Progress with Rt shoulder rehab per MD protocol Trial of active exercises next week    PT Home Exercise Plan [id number] - patient will refer to previous exercises does not want reprinted exercises    Consulted and Agree with Plan of Care Patient             Patient will benefit from skilled therapeutic intervention in order to improve the following deficits and impairments:     Visit Diagnosis: Stiffness of right shoulder joint  Muscle weakness (generalized)  Chronic right shoulder pain  Localized edema  Problem List Patient Active Problem List   Diagnosis Date Noted   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 04/04/2020   Primary osteoarthritis involving multiple  joints 07/26/2018   Acute left ankle pain 07/26/2018   Cerumen debris on tympanic membrane of right ear 07/26/2018   Tinea pedis 07/21/2018   Periscapular pain 07/20/2018   Retrocalcaneal bursitis, left 07/20/2018   Hepatitis B core antibody positive 07/20/2018   Polyarthralgia 07/20/2018   At risk for obstructive sleep apnea 07/20/2018   Hearing loss due to cerumen impaction, right 11/02/2017   Seasonal allergic rhinitis due to pollen 11/02/2017   Fasting hyperglycemia 08/06/2017   Primary osteoarthritis of right hip 08/06/2017   Chronic right-sided low back pain without sciatica 08/06/2017   Hypertriglyceridemia 08/06/2017   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   Gastroesophageal reflux disease without esophagitis 07/09/2017   Hypertension goal BP (blood pressure) < 130/80 07/09/2017   Class 2 obesity due to excess calories in adult 07/09/2017    Bernadean Saling Rober Minion PT, MPH  12/06/2020, 11:04 AM  [facility] [place] [address] [place], [place], [address] Phone: [phone]   Fax:  [phone]  Name: Adrian [name] MRN: [id number] Date of Birth: [birth year]

## 2020-12-11 ENCOUNTER — Ambulatory Visit: Payer: BC Managed Care – PPO | Admitting: Rehabilitative and Restorative Service Providers"

## 2020-12-11 ENCOUNTER — Encounter: Payer: Self-pay | Admitting: Rehabilitative and Restorative Service Providers"

## 2020-12-11 ENCOUNTER — Other Ambulatory Visit: Payer: Self-pay

## 2020-12-11 DIAGNOSIS — R600 Localized edema: Secondary | ICD-10-CM

## 2020-12-11 DIAGNOSIS — G8929 Other chronic pain: Secondary | ICD-10-CM

## 2020-12-11 DIAGNOSIS — M6281 Muscle weakness (generalized): Secondary | ICD-10-CM

## 2020-12-11 DIAGNOSIS — M25511 Pain in right shoulder: Secondary | ICD-10-CM | POA: Diagnosis not present

## 2020-12-11 DIAGNOSIS — M25611 Stiffness of right shoulder, not elsewhere classified: Secondary | ICD-10-CM | POA: Diagnosis not present

## 2020-12-11 NOTE — Therapy (Signed)
[facility] [place] [address] [place], [place], [address] Phone: [phone]   Fax:  [phone]  Physical Therapy Treatment  Patient Details  Name: Adrian [name] MRN: [id number] Date of Birth: 1961-04-14 Referring Provider (PT): Dr Ramond Marrow   Encounter Date: 12/11/2020   PT End of Session - 12/11/20 1018     Visit Number 7    Number of Visits 24    Date for PT Re-Evaluation 02/14/21    Authorization Type BCBS    PT Start Time 1015    PT Stop Time 1105    PT Time Calculation (min) 50 min    Activity Tolerance Patient tolerated treatment well             Past Medical History:  Diagnosis Date   Chronic right hip pain 08/06/2017   Chronic right-sided low back pain without sciatica 08/06/2017   GERD (gastroesophageal reflux disease)    Hypertension    Hypertriglyceridemia 08/06/2017   Seasonal allergies     Past Surgical History:  Procedure Laterality Date   ESOPHAGOGASTRODUODENOSCOPY     NO PAST SURGERIES     SHOULDER ARTHROSCOPY WITH SUBACROMIAL DECOMPRESSION AND BICEP TENDON REPAIR Left 05/31/2020   Procedure: LEFT SHOULDER ARTHROSCOPY WITH DEBRIDEMENT, DISTAL CLAVICAL EXCISION,  SUBACROMIAL DECOMPRESSION, BICEPS TENODESIS;  Surgeon: [name], MD;  Location: [facility];  Service: Orthopedics;  Laterality: Left;    There were no vitals filed for this visit.   Subjective Assessment - 12/11/20 1018     Subjective Patient reports that he is having some soreness in the biceps area. He is working in his garden and canning his produce.    Currently in Pain? No/denies    Pain Score 0-No pain    Pain Location Shoulder                [facility] PT Assessment - 12/11/20 0001       Assessment   Medical Diagnosis Rt/Lt RTC repair with bicep tendodesis and DCR    Referring Provider (PT) Dr Ramond Marrow    Onset Date/Surgical Date 10/08/20    Hand Dominance Right    Next MD Visit 01/01/21    Prior  Therapy here for Lt shoulder      Precautions   Precautions Shoulder    Type of Shoulder Precautions per protocol    Precaution Comments per protocol      PROM   Right Shoulder Flexion 135 Degrees    Right Shoulder External Rotation 56 Degrees      Palpation   Palpation comment muscular tightness Rt shoulder girdle - pecs, upper trap, leveator; deltoid; biceps; periscapular musculature significant tightness anterior shd through biceps and pecs                           [facility] Adult PT Treatment/Exercise - 12/11/20 0001       Shoulder Exercises: Standing   Flexion AROM;Right;Left;10 reps   elevation to ~ 60 deg VC to keep scapulae down and back   ABduction AROM;Right;Left;10 reps   ~ 60 deg VC to keep scapulae down and back   Other Standing Exercises standing scap squeeze x 10 reps 5-10 sec hold      Shoulder Exercises: Pulleys   Flexion --   10 reps within tissue tolerance holding 10 seconds   Scaption --   10 reps 10 sec hold   Other Pulley Exercises gentle ab/add at ~  80 deg      Shoulder Exercises: Isometric Strengthening   Flexion Limitations 10 sec x 10 reps    Extension Limitations 10 sec x 10 reps    External Rotation Limitations 10 sec x 10 reps    Internal Rotation Limitations 10 sec x 10 reps    ABduction Limitations 10 sec x 10 reps      Shoulder Exercises: Stretch   External Rotation Stretch 10 seconds   10 reps; standing with cane - elbow 90 deg at side     Vasopneumatic   Number Minutes Vasopneumatic  10 minutes    Vasopnuematic Location  Shoulder    Vasopneumatic Pressure Low    Vasopneumatic Temperature  34      Manual Therapy   Manual Therapy Soft tissue mobilization;Joint mobilization;Passive ROM;Scapular mobilization    Manual therapy comments to reduce muscle guarding and improve ROM    Joint Mobilization gentle grade I distraction for muscle relaxation    Soft tissue mobilization anterior chest/pecs, middle deltoid; biceps; upper  trap    Scapular Mobilization Rt    Passive ROM R shoulder flexion, scaption in scapular plane; ER in scapular plane              Trigger Point Dry Needling - 12/11/20 0001     Consent Given? Yes    Education Handout Provided Previously provided    Other Dry Needling Rt    Deltoid Response Palpable increased muscle length   anterior   Biceps Response Palpable increased muscle length;Twitch response elicited                    PT Short Term Goals - 11/22/20 1059       PT SHORT TERM GOAL #1   Title Instruct patient in AROM Rt forearm, wrist, hand    Time 4    Period Weeks    Status Achieved      PT SHORT TERM GOAL #2   Title begin Rt shoulder rehab as directed my Dr Everardo Pacific    Time 6    Period Weeks    Status Achieved      PT SHORT TERM GOAL #3   Title Independent in postural correction    Time 6    Period Weeks    Status On-going      PT SHORT TERM GOAL #4   Title Independent in self dressing and grooming    Time 6    Period Weeks    Status Achieved               PT Long Term Goals - 11/22/20 1100       PT LONG TERM GOAL #1   Title independent with initial HEP    Time 6    Period Weeks    Status On-going    Target Date 01/03/21      PT LONG TERM GOAL #2   Title demo Lt shoulder strength =/> 4/5 to allow initial return to activities at work and in his yard    Time 12    Period Weeks    Status New    Target Date 02/14/21      PT LONG TERM GOAL #3   Title Rt shoulder ROM WFL's and painfree    Time 12    Period Weeks    Status New    Target Date 02/14/21      PT LONG TERM GOAL #4   Title patient reports ability to  rest and sleep with minimal to no pain in Rt shoulder    Time 12    Period Weeks    Status New    Target Date 02/14/21      PT LONG TERM GOAL #5   Title Independent in all strengthening exercises and activities prior to d/c    Time 12    Period Weeks    Status New    Target Date 02/14/21                    Plan - 12/11/20 1019     Clinical Impression Statement Progressing well with shoulder rehab. Continues to progress with shoulder rehab per protocol. Note tightness Rt anerior shoulder and biceps. Trial of DN anterior deltoid/biceps    Rehab Potential Good    PT Frequency 2x / week    PT Duration 12 weeks    PT Treatment/Interventions Iontophoresis 4mg /ml Dexamethasone;Taping;Vasopneumatic Device;Patient/family education;Moist Heat;Therapeutic activities;Passive range of motion;Dry needling;Therapeutic exercise;Cryotherapy;Electrical Stimulation;Neuromuscular re-education;Manual techniques    PT Next Visit Plan Progress with Rt shoulder rehab per MD protocol.- add light resistive exercises at 10-12 weeks. 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 04/04/2020   Primary osteoarthritis involving multiple joints 07/26/2018   Acute left ankle pain 07/26/2018   Cerumen debris on tympanic membrane of right ear 07/26/2018   Tinea pedis 07/21/2018   Periscapular pain 07/20/2018   Retrocalcaneal bursitis, left 07/20/2018   Hepatitis B core antibody positive 07/20/2018   Polyarthralgia 07/20/2018   At risk for obstructive sleep apnea 07/20/2018   Hearing loss due to cerumen impaction, right 11/02/2017   Seasonal allergic rhinitis due to pollen 11/02/2017   Fasting hyperglycemia 08/06/2017   Primary osteoarthritis of right hip  08/06/2017   Chronic right-sided low back pain without sciatica 08/06/2017   Hypertriglyceridemia 08/06/2017   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   Gastroesophageal reflux disease without esophagitis 07/09/2017   Hypertension goal BP (blood pressure) < 130/80 07/09/2017   Class 2 obesity due to excess calories in adult 07/09/2017    Shamyia Grandpre P Jung Yurchak PT, MPH  12/11/2020, 11:00 AM  [facility] [place] [address] [place], [place], [place] Phone: [phone]   Fax:  [phone]  Name: Adrian [name] MRN: [id number] Poot Date of Birth: 22-Dec-1960

## 2020-12-13 ENCOUNTER — Ambulatory Visit: Payer: BC Managed Care – PPO | Admitting: Rehabilitative and Restorative Service Providers"

## 2020-12-13 ENCOUNTER — Other Ambulatory Visit: Payer: Self-pay

## 2020-12-13 DIAGNOSIS — R600 Localized edema: Secondary | ICD-10-CM

## 2020-12-13 DIAGNOSIS — M6281 Muscle weakness (generalized): Secondary | ICD-10-CM | POA: Diagnosis not present

## 2020-12-13 DIAGNOSIS — M25611 Stiffness of right shoulder, not elsewhere classified: Secondary | ICD-10-CM | POA: Diagnosis not present

## 2020-12-13 DIAGNOSIS — M25511 Pain in right shoulder: Secondary | ICD-10-CM | POA: Diagnosis not present

## 2020-12-13 DIAGNOSIS — G8929 Other chronic pain: Secondary | ICD-10-CM

## 2020-12-13 NOTE — Therapy (Signed)
[facility] [place] [address] [place], [place], [address] Phone: [phone]   Fax:  [phone]  Physical Therapy Treatment  Patient Details  Name: Adrian [name] MRN: [id number] Date of Birth: [birth year] Referring Provider (PT): Dr Ramond Marrow   Encounter Date: 12/13/2020   PT End of Session - 12/13/20 1013     Visit Number 8    Number of Visits 24    Date for PT Re-Evaluation 02/14/21    Authorization Type BCBS    PT Start Time 1008    PT Stop Time 1059    PT Time Calculation (min) 51 min    Activity Tolerance Patient tolerated treatment well             Past Medical History:  Diagnosis Date   Chronic right hip pain 08/06/2017   Chronic right-sided low back pain without sciatica 08/06/2017   GERD (gastroesophageal reflux disease)    Hypertension    Hypertriglyceridemia 08/06/2017   Seasonal allergies     Past Surgical History:  Procedure Laterality Date   ESOPHAGOGASTRODUODENOSCOPY     NO PAST SURGERIES     SHOULDER ARTHROSCOPY WITH SUBACROMIAL DECOMPRESSION AND BICEP TENDON REPAIR Left 05/31/2020   Procedure: LEFT SHOULDER ARTHROSCOPY WITH DEBRIDEMENT, DISTAL CLAVICAL EXCISION,  SUBACROMIAL DECOMPRESSION, BICEPS TENODESIS;  Surgeon: [name], MD;  Location: [facility];  Service: Orthopedics;  Laterality: Left;    There were no vitals filed for this visit.   Subjective Assessment - 12/13/20 1012     Subjective A little tight in the biceps - not as sore. Working in the garden    Currently in Pain? No/denies    Pain Score 0-No pain                               [facility] Adult PT Treatment/Exercise - 12/13/20 0001       Shoulder Exercises: Standing   Flexion AROM;Right;Left;10 reps   elevation to ~ 60 deg VC to keep scapulae down and back   ABduction AROM;Right;Left;10 reps   ~ 60 deg VC to keep scapulae down and back   Other Standing Exercises standing scap squeeze x  10 reps 5-10 sec hold      Shoulder Exercises: Pulleys   Flexion --   10 reps within tissue tolerance holding 10 seconds   Scaption --   10 reps 10 sec hold   Other Pulley Exercises gentle ab/add at ~ 80 deg      Shoulder Exercises: Isometric Strengthening   Flexion Limitations 10 sec x 10 reps    Extension Limitations 10 sec x 10 reps    External Rotation Limitations 10 sec x 10 reps    Internal Rotation Limitations 10 sec x 10 reps    ABduction Limitations 10 sec x 10 reps      Shoulder Exercises: Stretch   External Rotation Stretch 10 seconds   10 reps; standing with cane - elbow 90 deg at side     Vasopneumatic   Number Minutes Vasopneumatic  10 minutes    Vasopnuematic Location  Shoulder    Vasopneumatic Pressure Low    Vasopneumatic Temperature  34      Manual Therapy   Manual Therapy Soft tissue mobilization;Joint mobilization;Passive ROM;Scapular mobilization    Manual therapy comments to reduce muscle guarding and improve ROM    Joint Mobilization circumduction of Rt GH joint with distraction to encourage  muscle relaxation    Soft tissue mobilization anterior chest/pecs, middle deltoid; biceps; upper trap    Scapular Mobilization Rt    Passive ROM R shoulder flexion, scaption in scapular plane; ER in scapular plane                      PT Short Term Goals - 11/22/20 1059       PT SHORT TERM GOAL #1   Title Instruct patient in AROM Rt forearm, wrist, hand    Time 4    Period Weeks    Status Achieved      PT SHORT TERM GOAL #2   Title begin Rt shoulder rehab as directed my Dr Everardo Pacific    Time 6    Period Weeks    Status Achieved      PT SHORT TERM GOAL #3   Title Independent in postural correction    Time 6    Period Weeks    Status On-going      PT SHORT TERM GOAL #4   Title Independent in self dressing and grooming    Time 6    Period Weeks    Status Achieved               PT Long Term Goals - 11/22/20 1100       PT LONG TERM  GOAL #1   Title independent with initial HEP    Time 6    Period Weeks    Status On-going    Target Date 01/03/21      PT LONG TERM GOAL #2   Title demo Lt shoulder strength =/> 4/5 to allow initial return to activities at work and in his yard    Time 12    Period Weeks    Status New    Target Date 02/14/21      PT LONG TERM GOAL #3   Title Rt shoulder ROM WFL's and painfree    Time 12    Period Weeks    Status New    Target Date 02/14/21      PT LONG TERM GOAL #4   Title patient reports ability to rest and sleep with minimal to no pain in Rt shoulder    Time 12    Period Weeks    Status New    Target Date 02/14/21      PT LONG TERM GOAL #5   Title Independent in all strengthening exercises and activities prior to d/c    Time 12    Period Weeks    Status New    Target Date 02/14/21                   Plan - 12/13/20 1013     Clinical Impression Statement Continued progress with shoulder rehab with no problems. Can progress with light resistive exercises at 10-12 weeks - 10 weeks on 12/17/20    Rehab Potential Good    PT Frequency 2x / week    PT Duration 12 weeks    PT Treatment/Interventions Iontophoresis 4mg /ml Dexamethasone;Taping;Vasopneumatic Device;Patient/family education;Moist Heat;Therapeutic activities;Passive range of motion;Dry needling;Therapeutic exercise;Cryotherapy;Electrical Stimulation;Neuromuscular re-education;Manual techniques    PT Next Visit Plan Progress with Rt shoulder rehab per MD protocol.- add light resistive exercises at 10-12 weeks. 10 weeks on 12/17/20. 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 04/04/2020   Primary osteoarthritis involving multiple joints 07/26/2018   Acute left ankle pain 07/26/2018   Cerumen debris on tympanic membrane of right ear 07/26/2018   Tinea pedis 07/21/2018   Periscapular pain 07/20/2018   Retrocalcaneal bursitis, left 07/20/2018   Hepatitis B core antibody positive 07/20/2018   Polyarthralgia 07/20/2018   At risk for obstructive sleep apnea 07/20/2018   Hearing loss due to cerumen impaction, right 11/02/2017   Seasonal allergic rhinitis due to pollen 11/02/2017   Fasting hyperglycemia 08/06/2017   Primary osteoarthritis of right hip 08/06/2017   Chronic right-sided low back pain without sciatica 08/06/2017   Hypertriglyceridemia 08/06/2017   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   Gastroesophageal reflux disease without esophagitis 07/09/2017   Hypertension goal BP (blood pressure) < 130/80 07/09/2017   Class 2 obesity due to excess calories in adult 07/09/2017    Wojciech Willetts Rober Minion PT, MPH  12/13/2020, 10:48 AM  [facility] [address] [place] [address] [place], [place], [address] Phone: [phone]   Fax:  [phone]  Name: Adrian [name] MRN: [id number] Date of Birth: Dec 22, 1960

## 2020-12-17 ENCOUNTER — Other Ambulatory Visit: Payer: Self-pay

## 2020-12-17 ENCOUNTER — Encounter: Payer: BC Managed Care – PPO | Admitting: Physical Therapy

## 2020-12-18 ENCOUNTER — Encounter: Payer: BC Managed Care – PPO | Admitting: Physical Therapy

## 2020-12-18 ENCOUNTER — Ambulatory Visit: Payer: BC Managed Care – PPO | Admitting: Physical Therapy

## 2020-12-18 DIAGNOSIS — M25511 Pain in right shoulder: Secondary | ICD-10-CM | POA: Diagnosis not present

## 2020-12-18 DIAGNOSIS — R600 Localized edema: Secondary | ICD-10-CM

## 2020-12-18 DIAGNOSIS — M6281 Muscle weakness (generalized): Secondary | ICD-10-CM

## 2020-12-18 DIAGNOSIS — M25611 Stiffness of right shoulder, not elsewhere classified: Secondary | ICD-10-CM | POA: Diagnosis not present

## 2020-12-18 DIAGNOSIS — G8929 Other chronic pain: Secondary | ICD-10-CM

## 2020-12-18 NOTE — Therapy (Signed)
[facility] [place] [address] [place], [place], [address] Phone: [phone]   Fax:  [phone]  Physical Therapy Treatment  Patient Details  Name: Adrian [name] MRN: [id number] Date of Birth: 15-Nov-1960 Referring Provider (PT): Dr Ramond Marrow   Encounter Date: 12/18/2020   PT End of Session - 12/18/20 1100     Visit Number 9    Number of Visits 24    Date for PT Re-Evaluation 02/14/21    Authorization Type BCBS    PT Start Time 1020    PT Stop Time 1106    PT Time Calculation (min) 46 min    Activity Tolerance Patient tolerated treatment well    Behavior During Therapy [facility] for tasks assessed/performed             Past Medical History:  Diagnosis Date   Chronic right hip pain 08/06/2017   Chronic right-sided low back pain without sciatica 08/06/2017   GERD (gastroesophageal reflux disease)    Hypertension    Hypertriglyceridemia 08/06/2017   Seasonal allergies     Past Surgical History:  Procedure Laterality Date   ESOPHAGOGASTRODUODENOSCOPY     NO PAST SURGERIES     SHOULDER ARTHROSCOPY WITH SUBACROMIAL DECOMPRESSION AND BICEP TENDON REPAIR Left 05/31/2020   Procedure: LEFT SHOULDER ARTHROSCOPY WITH DEBRIDEMENT, DISTAL CLAVICAL EXCISION,  SUBACROMIAL DECOMPRESSION, BICEPS TENODESIS;  Surgeon: [name], MD;  Location: [facility];  Service: Orthopedics;  Laterality: Left;    There were no vitals filed for this visit.   Subjective Assessment - 12/18/20 1026     Subjective "I did something I probably shouldn't have".  Pt reports he used both arms to put a fence post in.  He felt a pop and soreness, but he felt "something gave way and felt less restricted". He believes maybe it was scar tissue that broke loose.  Throughout session, pt voiced frustration that his Rt shoulder is not has far along as he'd like it to be. "I have to remind myself where I'm at" (re: phase of rehab for this shoulder)     Patient Stated Goals use the Rt UE as normal.  HIking , build things, and shoot a gun and landscaping    Currently in Pain? No/denies    Pain Score 0-No pain                [facility] PT Assessment - 12/18/20 0001       Assessment   Medical Diagnosis Rt/Lt RTC repair with bicep tendodesis and DCR    Referring Provider (PT) Dr Ramond Marrow    Onset Date/Surgical Date 10/08/20    Hand Dominance Right    Next MD Visit 01/01/21    Prior Therapy here for Lt shoulder      Precautions   Type of Shoulder Precautions per protocol    Precaution Comments per protocol             [facility] Adult PT Treatment/Exercise - 12/18/20 0001       Shoulder Exercises: Standing   Flexion AROM;Right;10 reps   elevation to ~ 70 deg VC to keep scapulae down and back   ABduction AROM;Right;10 reps   ~ 60 deg VC to keep scapulae down and back   ABduction Limitations scaption, mirror for visual feedback    Extension Strengthening;Right;10 reps;Theraband    Theraband Level (Shoulder Extension) Level 1 (Yellow)    Other Standing Exercises ball on wall with Rt shoulder flexed to ~50  sec with CW/CCW circles (fatigues quickly, requires short rest break between directions)      Shoulder Exercises: Isometric Strengthening   Flexion --   10 reps, 3 sec hold.   Flexion Limitations yellow band, reactive isometric. elbow flexed to 90 deg    External Rotation --   10 reps, 3 sec hold.   External Rotation Limitations yellow band, reactive isometric    Internal Rotation --   10 reps, 3 sec hold.   Internal Rotation Limitations yellow band, reactive isometric      Shoulder Exercises: Stretch   Cross Chest Stretch 3 reps   15 sec, good tolerance   Internal Rotation Stretch 3 reps   strap assist, 10, 15, and 25 seconds.   Table Stretch - Flexion 5 reps;10 seconds   walk back at sink.   Other Shoulder Stretches Rt bicep stretch holding counter x 30 sec x 2      Vasopneumatic   Number Minutes Vasopneumatic  10 minutes     Vasopnuematic Location  Shoulder   Rt   Vasopneumatic Pressure Low    Vasopneumatic Temperature  34                      PT Short Term Goals - 11/22/20 1059       PT SHORT TERM GOAL #1   Title Instruct patient in AROM Rt forearm, wrist, hand    Time 4    Period Weeks    Status Achieved      PT SHORT TERM GOAL #2   Title begin Rt shoulder rehab as directed my Dr Everardo Pacific    Time 6    Period Weeks    Status Achieved      PT SHORT TERM GOAL #3   Title Independent in postural correction    Time 6    Period Weeks    Status On-going      PT SHORT TERM GOAL #4   Title Independent in self dressing and grooming    Time 6    Period Weeks    Status Achieved               PT Long Term Goals - 11/22/20 1100       PT LONG TERM GOAL #1   Title independent with initial HEP    Time 6    Period Weeks    Status On-going    Target Date 01/03/21      PT LONG TERM GOAL #2   Title demo Lt shoulder strength =/> 4/5 to allow initial return to activities at work and in his yard    Time 12    Period Weeks    Status New    Target Date 02/14/21      PT LONG TERM GOAL #3   Title Rt shoulder ROM WFL's and painfree    Time 12    Period Weeks    Status New    Target Date 02/14/21      PT LONG TERM GOAL #4   Title patient reports ability to rest and sleep with minimal to no pain in Rt shoulder    Time 12    Period Weeks    Status New    Target Date 02/14/21      PT LONG TERM GOAL #5   Title Independent in all strengthening exercises and activities prior to d/c    Time 12    Period Weeks    Status New  Target Date 02/14/21                   Plan - 12/18/20 1211     Clinical Impression Statement Gentle progression of resistance exercises added to HEP, including reactive isometrics for ER, IR, flexion.  Rt shoulder fatigues quickly with flexion/ scaption greater than 60 deg (AROM). Exercises completed within tissue limits and no pain.   Reviewed  current phase of rehab and what is allowed/motions to avoid.    Rehab Potential Good    PT Frequency 2x / week    PT Duration 12 weeks    PT Treatment/Interventions Iontophoresis 4mg /ml Dexamethasone;Taping;Vasopneumatic Device;Patient/family education;Moist Heat;Therapeutic activities;Passive range of motion;Dry needling;Therapeutic exercise;Cryotherapy;Electrical Stimulation;Neuromuscular re-education;Manual techniques    PT Next Visit Plan Progress with Rt shoulder rehab per MD protocol.- add light resistive exercises at 10-12 weeks. 10 weeks on 12/17/20. Continue DN as indicated    PT Home Exercise Plan [id number] - patient will refer to previous exercises does not want reprinted exercises    Consulted and Agree with Plan of Care Patient             Patient will benefit from skilled therapeutic intervention in order to improve the following deficits and impairments:  Decreased range of motion, Impaired UE functional use, Increased muscle spasms, Pain, Postural dysfunction, Decreased strength  Visit Diagnosis: Stiffness of right shoulder joint  Muscle weakness (generalized)  Chronic right shoulder pain  Localized edema     Problem List Patient Active Problem List   Diagnosis Date Noted   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 04/04/2020   Primary osteoarthritis involving multiple joints 07/26/2018   Acute left ankle pain 07/26/2018   Cerumen debris on tympanic membrane of right ear 07/26/2018   Tinea pedis 07/21/2018   Periscapular pain 07/20/2018   Retrocalcaneal bursitis, left 07/20/2018   Hepatitis B core antibody positive 07/20/2018   Polyarthralgia 07/20/2018   At risk for obstructive sleep apnea 07/20/2018   Hearing loss due to cerumen impaction, right 11/02/2017   Seasonal allergic rhinitis due to pollen 11/02/2017   Fasting hyperglycemia 08/06/2017   Primary osteoarthritis of right hip 08/06/2017   Chronic right-sided low back pain without sciatica  08/06/2017   Hypertriglyceridemia 08/06/2017   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   Gastroesophageal reflux disease without esophagitis 07/09/2017   Hypertension goal BP (blood pressure) < 130/80 07/09/2017   Class 2 obesity due to excess calories in adult 07/09/2017   07/11/2017, PTA 12/18/20 12:17 PM  [facility] [place] [address] [place], [place], [place] Phone: [phone]   Fax:  [phone]  Name: Adrian [name] MRN: [id number] Poot Date of Birth: 01/10/1961

## 2020-12-20 ENCOUNTER — Ambulatory Visit: Payer: BC Managed Care – PPO | Admitting: Rehabilitative and Restorative Service Providers"

## 2020-12-20 ENCOUNTER — Other Ambulatory Visit: Payer: Self-pay

## 2020-12-20 ENCOUNTER — Encounter: Payer: Self-pay | Admitting: Rehabilitative and Restorative Service Providers"

## 2020-12-20 DIAGNOSIS — M6281 Muscle weakness (generalized): Secondary | ICD-10-CM | POA: Diagnosis not present

## 2020-12-20 DIAGNOSIS — M25511 Pain in right shoulder: Secondary | ICD-10-CM

## 2020-12-20 DIAGNOSIS — R600 Localized edema: Secondary | ICD-10-CM

## 2020-12-20 DIAGNOSIS — G8929 Other chronic pain: Secondary | ICD-10-CM

## 2020-12-20 DIAGNOSIS — M25611 Stiffness of right shoulder, not elsewhere classified: Secondary | ICD-10-CM

## 2020-12-20 NOTE — Therapy (Signed)
[facility] [place] [address] [place], [place], [address] Phone: [phone]   Fax:  [phone]  Physical Therapy Treatment  Patient Details  Name: Adrian [name] MRN: [id number] Date of Birth: Jul 03, 1960 Referring Provider (PT): Dr Ramond Marrow   Encounter Date: 12/20/2020   PT End of Session - 12/20/20 1022     Visit Number 10    Number of Visits 24    Date for PT Re-Evaluation 02/14/21    Authorization Type BCBS    PT Start Time 1018    PT Stop Time 1106    PT Time Calculation (min) 48 min    Activity Tolerance Patient tolerated treatment well             Past Medical History:  Diagnosis Date   Chronic right hip pain 08/06/2017   Chronic right-sided low back pain without sciatica 08/06/2017   GERD (gastroesophageal reflux disease)    Hypertension    Hypertriglyceridemia 08/06/2017   Seasonal allergies     Past Surgical History:  Procedure Laterality Date   ESOPHAGOGASTRODUODENOSCOPY     NO PAST SURGERIES     SHOULDER ARTHROSCOPY WITH SUBACROMIAL DECOMPRESSION AND BICEP TENDON REPAIR Left 05/31/2020   Procedure: LEFT SHOULDER ARTHROSCOPY WITH DEBRIDEMENT, DISTAL CLAVICAL EXCISION,  SUBACROMIAL DECOMPRESSION, BICEPS TENODESIS;  Surgeon: [name], MD;  Location: [facility];  Service: Orthopedics;  Laterality: Left;    There were no vitals filed for this visit.   Subjective Assessment - 12/20/20 1022     Subjective Patient reports that he has continued with functional activities and exercises for Rt UE. Still working in the garden and preserving vegetables.    Currently in Pain? No/denies    Pain Score 0-No pain                               [facility] Adult PT Treatment/Exercise - 12/20/20 0001       Shoulder Exercises: Standing   Flexion AROM;Right;10 reps   elevation to ~ 70 deg VC to keep scapulae down and back   ABduction AROM;Right;10 reps   ~ 60 deg VC to keep  scapulae down and back   Extension Strengthening;Right;10 reps;Theraband    Theraband Level (Shoulder Extension) Level 1 (Yellow)      Shoulder Exercises: Pulleys   Flexion --   10 reps x 10 sec   Scaption --   10 reps 10 sec hold   Other Pulley Exercises gentle ab/add at ~ 80 deg      Shoulder Exercises: Isometric Strengthening   Flexion --   10 reps, 3 sec hold.   Flexion Limitations yellow band, reactive isometric. elbow flexed to 90 deg    External Rotation --   10 reps, 3 sec hold.   External Rotation Limitations yellow band, reactive isometric    Internal Rotation --   10 reps, 3 sec hold.   Internal Rotation Limitations yellow band, reactive isometric      Shoulder Exercises: Stretch   Internal Rotation Stretch 3 reps   strap assist, 10, 15, and 25 seconds.   External Rotation Stretch 5 reps;10 seconds   with cane   Table Stretch - Flexion 5 reps;10 seconds   walk back at sink.   Other Shoulder Stretches Rt bicep stretch holding counter x 30 sec x 2      Vasopneumatic   Number Minutes Vasopneumatic  10 minutes  Vasopnuematic Location  Shoulder   Rt   Vasopneumatic Pressure Low    Vasopneumatic Temperature  34      Manual Therapy   Manual Therapy Soft tissue mobilization;Joint mobilization;Passive ROM;Scapular mobilization    Manual therapy comments to reduce muscle guarding and improve ROM    Joint Mobilization circumduction of Rt GH joint with distraction to encourage muscle relaxation    Soft tissue mobilization anterior chest/pecs, middle deltoid; biceps; upper trap    Scapular Mobilization Rt    Passive ROM R shoulder flexion, scaption in scapular plane; ER in scapular plane                      PT Short Term Goals - 11/22/20 1059       PT SHORT TERM GOAL #1   Title Instruct patient in AROM Rt forearm, wrist, hand    Time 4    Period Weeks    Status Achieved      PT SHORT TERM GOAL #2   Title begin Rt shoulder rehab as directed my Dr Everardo Pacific     Time 6    Period Weeks    Status Achieved      PT SHORT TERM GOAL #3   Title Independent in postural correction    Time 6    Period Weeks    Status On-going      PT SHORT TERM GOAL #4   Title Independent in self dressing and grooming    Time 6    Period Weeks    Status Achieved               PT Long Term Goals - 11/22/20 1100       PT LONG TERM GOAL #1   Title independent with initial HEP    Time 6    Period Weeks    Status On-going    Target Date 01/03/21      PT LONG TERM GOAL #2   Title demo Lt shoulder strength =/> 4/5 to allow initial return to activities at work and in his yard    Time 12    Period Weeks    Status New    Target Date 02/14/21      PT LONG TERM GOAL #3   Title Rt shoulder ROM WFL's and painfree    Time 12    Period Weeks    Status New    Target Date 02/14/21      PT LONG TERM GOAL #4   Title patient reports ability to rest and sleep with minimal to no pain in Rt shoulder    Time 12    Period Weeks    Status New    Target Date 02/14/21      PT LONG TERM GOAL #5   Title Independent in all strengthening exercises and activities prior to d/c    Time 12    Period Weeks    Status New    Target Date 02/14/21                   Plan - 12/20/20 1023     Clinical Impression Statement No increase in symptoms following digging a post hole last week. He continues to have limited AROM/strength Rt UE. Continue with rehab per protocol.    Rehab Potential Good    PT Frequency 2x / week    PT Duration 12 weeks    PT Treatment/Interventions Iontophoresis 4mg /ml Dexamethasone;Taping;Vasopneumatic Device;Patient/family education;Moist Heat;Therapeutic activities;Passive  range of motion;Dry needling;Therapeutic exercise;Cryotherapy;Electrical Stimulation;Neuromuscular re-education;Manual techniques    PT Next Visit Plan Progress with Rt shoulder rehab per MD protocol.- add light resistive exercises at 10-12 weeks. 10 weeks on 12/17/20.  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 04/04/2020   Primary osteoarthritis involving multiple joints 07/26/2018   Acute left ankle pain 07/26/2018   Cerumen debris on tympanic membrane of right ear 07/26/2018   Tinea pedis 07/21/2018   Periscapular pain 07/20/2018   Retrocalcaneal bursitis, left 07/20/2018   Hepatitis B core antibody positive 07/20/2018   Polyarthralgia 07/20/2018   At risk for obstructive sleep apnea 07/20/2018   Hearing loss due to cerumen impaction, right 11/02/2017   Seasonal allergic rhinitis due to pollen 11/02/2017   Fasting hyperglycemia 08/06/2017   Primary osteoarthritis of right hip 08/06/2017   Chronic right-sided low back pain without sciatica 08/06/2017   Hypertriglyceridemia 08/06/2017   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   Gastroesophageal reflux disease without esophagitis 07/09/2017   Hypertension goal BP (blood pressure) < 130/80 07/09/2017   Class 2 obesity due to excess calories in adult 07/09/2017    Aizik Reh Rober Minion PT, MPH  12/20/2020, 10:59 AM  [facility] [address] [place], [place], [address] Phone: [phone]   Fax:  [phone]  Name: Adrian [name] MRN: [id number] Date of Birth: Nov 25, 1960

## 2020-12-25 ENCOUNTER — Other Ambulatory Visit: Payer: Self-pay

## 2020-12-25 ENCOUNTER — Ambulatory Visit: Payer: BC Managed Care – PPO | Admitting: Rehabilitative and Restorative Service Providers"

## 2020-12-25 ENCOUNTER — Encounter: Payer: Self-pay | Admitting: Rehabilitative and Restorative Service Providers"

## 2020-12-25 DIAGNOSIS — M6281 Muscle weakness (generalized): Secondary | ICD-10-CM

## 2020-12-25 DIAGNOSIS — M25511 Pain in right shoulder: Secondary | ICD-10-CM | POA: Diagnosis not present

## 2020-12-25 DIAGNOSIS — R600 Localized edema: Secondary | ICD-10-CM | POA: Diagnosis not present

## 2020-12-25 DIAGNOSIS — G8929 Other chronic pain: Secondary | ICD-10-CM

## 2020-12-25 DIAGNOSIS — M25611 Stiffness of right shoulder, not elsewhere classified: Secondary | ICD-10-CM

## 2020-12-25 NOTE — Patient Instructions (Addendum)
Keeping shoulder blades down and back for all exercises and activities   Add 1# weight to shoulder flexion and scaption in standing  External rotation in left sidelying 1# wt   Rhythmic stabilization lying on back - arm at 90 deg - move hand up/down; side to side; circles CW/CCW  Theraband standing - row and shoulder extension red to start  Holding with right hand elbow bend to 90 deg step to right side  Holding band with both hands elbows at side moving into slight L's/open door

## 2020-12-25 NOTE — Therapy (Signed)
[facility] [place] [address] [place], [place], [address] Phone: [phone]   Fax:  [phone]  Physical Therapy Treatment  Patient Details  Name: Adrian [name] MRN: [id number] Date of Birth: 1960-07-30 Referring Provider (PT): Dr Ramond Marrow   Encounter Date: 12/25/2020   PT End of Session - 12/25/20 1014     Visit Number 11    Number of Visits 24    Date for PT Re-Evaluation 02/14/21    Authorization Type BCBS    PT Start Time 1013    PT Stop Time 1101    PT Time Calculation (min) 48 min    Activity Tolerance Patient tolerated treatment well             Past Medical History:  Diagnosis Date   Chronic right hip pain 08/06/2017   Chronic right-sided low back pain without sciatica 08/06/2017   GERD (gastroesophageal reflux disease)    Hypertension    Hypertriglyceridemia 08/06/2017   Seasonal allergies     Past Surgical History:  Procedure Laterality Date   ESOPHAGOGASTRODUODENOSCOPY     NO PAST SURGERIES     SHOULDER ARTHROSCOPY WITH SUBACROMIAL DECOMPRESSION AND BICEP TENDON REPAIR Left 05/31/2020   Procedure: LEFT SHOULDER ARTHROSCOPY WITH DEBRIDEMENT, DISTAL CLAVICAL EXCISION,  SUBACROMIAL DECOMPRESSION, BICEPS TENODESIS;  Surgeon: [name], MD;  Location: [facility];  Service: Orthopedics;  Laterality: Left;    There were no vitals filed for this visit.   Subjective Assessment - 12/25/20 1014     Subjective Working on exercises at home.    Currently in Pain? No/denies    Pain Score 0-No pain                [facility] PT Assessment - 12/25/20 0001       Assessment   Medical Diagnosis Rt/Lt RTC repair with bicep tendodesis and DCR    Referring Provider (PT) Dr Ramond Marrow    Onset Date/Surgical Date 10/08/20    Hand Dominance Right    Next MD Visit 01/01/21    Prior Therapy here for Lt shoulder      PROM   Right Shoulder Flexion 152 Degrees    Right Shoulder ABduction 156  Degrees   in scapular plane   Right Shoulder External Rotation 80 Degrees   in scapular plane                          [facility] Adult PT Treatment/Exercise - 12/25/20 0001       Shoulder Exercises: Supine   Other Supine Exercises scapular rhythmic stabilization - flexion/extension; horizontal ab/add; circles CW/CCW x ~ 20-25 reps    Other Supine Exercises supine snow angel 1-2 min ~ 50 deg x 3 reps      Shoulder Exercises: Sidelying   External Rotation Strengthening;Right;10 reps    External Rotation Weight (lbs) 1    ABduction Strengthening;Right;10 reps   elbow flexed - forearm level     Shoulder Exercises: Standing   External Rotation Strengthening;Right;10 reps;Theraband    Theraband Level (Shoulder External Rotation) Level 2 (Red)    External Rotation Limitations reactive isometric    Flexion Strengthening;Right;10 reps   elevation to ~ 70 deg VC to keep scapulae down and back   Shoulder Flexion Weight (lbs) 1 Rt; 2 Lt    ABduction Strengthening;Right;10 reps   ~ 60 deg VC to keep scapulae down and back   Shoulder ABduction Weight (lbs)  1 Rt; 2 Lt    Extension Strengthening;Both;10 reps;Theraband    Theraband Level (Shoulder Extension) Level 2 (Red)    Row Strengthening;Both;10 reps;Theraband    Theraband Level (Shoulder Row) Level 2 (Red)    Retraction Strengthening;Both;10 reps;Theraband    Theraband Level (Shoulder Retraction) Level 1 (Yellow)    Retraction Limitations minimal movement more isometric      Shoulder Exercises: Pulleys   Flexion --   10 reps x 10 sec   Scaption --   10 reps 10 sec hold   Other Pulley Exercises gentle ab/add at ~ 80 deg      Vasopneumatic   Number Minutes Vasopneumatic  10 minutes    Vasopnuematic Location  Shoulder   Rt   Vasopneumatic Pressure Low    Vasopneumatic Temperature  34      Manual Therapy   Manual Therapy Soft tissue mobilization;Joint mobilization;Passive ROM;Scapular mobilization    Manual therapy comments  to reduce muscle guarding and improve ROM    Joint Mobilization circumduction of Rt GH joint with distraction to encourage muscle relaxation    Soft tissue mobilization anterior chest/pecs, middle deltoid; biceps; upper trap    Scapular Mobilization Rt    Passive ROM R shoulder flexion, scaption in scapular plane; ER in scapular plane                    PT Education - 12/25/20 1054     Education Details HEP    Person(s) Educated Patient    Methods Explanation;Demonstration;Tactile cues;Verbal cues;Handout    Comprehension Verbalized understanding;Returned demonstration;Verbal cues required;Tactile cues required              PT Short Term Goals - 11/22/20 1059       PT SHORT TERM GOAL #1   Title Instruct patient in AROM Rt forearm, wrist, hand    Time 4    Period Weeks    Status Achieved      PT SHORT TERM GOAL #2   Title begin Rt shoulder rehab as directed my Dr Everardo Pacific    Time 6    Period Weeks    Status Achieved      PT SHORT TERM GOAL #3   Title Independent in postural correction    Time 6    Period Weeks    Status On-going      PT SHORT TERM GOAL #4   Title Independent in self dressing and grooming    Time 6    Period Weeks    Status Achieved               PT Long Term Goals - 11/22/20 1100       PT LONG TERM GOAL #1   Title independent with initial HEP    Time 6    Period Weeks    Status On-going    Target Date 01/03/21      PT LONG TERM GOAL #2   Title demo Lt shoulder strength =/> 4/5 to allow initial return to activities at work and in his yard    Time 12    Period Weeks    Status New    Target Date 02/14/21      PT LONG TERM GOAL #3   Title Rt shoulder ROM WFL's and painfree    Time 12    Period Weeks    Status New    Target Date 02/14/21      PT LONG TERM GOAL #4   Title  patient reports ability to rest and sleep with minimal to no pain in Rt shoulder    Time 12    Period Weeks    Status New    Target Date 02/14/21       PT LONG TERM GOAL #5   Title Independent in all strengthening exercises and activities prior to d/c    Time 12    Period Weeks    Status New    Target Date 02/14/21                   Plan - 12/25/20 1021     Clinical Impression Statement Continued progress with shoulder rehab, adding exercises without difficulty.    Rehab Potential Good    PT Frequency 2x / week    PT Duration 12 weeks    PT Treatment/Interventions Iontophoresis 4mg /ml Dexamethasone;Taping;Vasopneumatic Device;Patient/family education;Moist Heat;Therapeutic activities;Passive range of motion;Dry needling;Therapeutic exercise;Cryotherapy;Electrical Stimulation;Neuromuscular re-education;Manual techniques    PT Next Visit Plan Progress with Rt shoulder rehab per MD protocol.- add light resistive exercises at 10-12 weeks. 11 weeks on 12/24/20. 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 04/04/2020   Primary osteoarthritis involving multiple joints 07/26/2018   Acute left ankle pain 07/26/2018   Cerumen debris on tympanic membrane of right ear 07/26/2018   Tinea pedis 07/21/2018   Periscapular pain 07/20/2018   Retrocalcaneal bursitis, left 07/20/2018   Hepatitis B core antibody positive 07/20/2018   Polyarthralgia 07/20/2018   At risk for obstructive sleep apnea 07/20/2018   Hearing loss due to cerumen impaction, right 11/02/2017   Seasonal allergic rhinitis due to pollen  11/02/2017   Fasting hyperglycemia 08/06/2017   Primary osteoarthritis of right hip 08/06/2017   Chronic right-sided low back pain without sciatica 08/06/2017   Hypertriglyceridemia 08/06/2017   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   Gastroesophageal reflux disease without esophagitis 07/09/2017   Hypertension goal BP (blood pressure) < 130/80 07/09/2017   Class 2 obesity due to excess calories in adult 07/09/2017    Dublin Cantero 07/11/2017 PT, MPH  12/25/2020, 11:18 AM  [facility] [address] [place], [place], [place] Phone: [phone]   Fax:  [phone]  Name: Adrian [name] MRN: Frankey Poot Date of Birth: January 30, 1961

## 2020-12-27 ENCOUNTER — Ambulatory Visit (INDEPENDENT_AMBULATORY_CARE_PROVIDER_SITE_OTHER): Payer: BC Managed Care – PPO | Admitting: Rehabilitative and Restorative Service Providers"

## 2020-12-27 ENCOUNTER — Encounter: Payer: Self-pay | Admitting: Rehabilitative and Restorative Service Providers"

## 2020-12-27 ENCOUNTER — Other Ambulatory Visit: Payer: Self-pay

## 2020-12-27 DIAGNOSIS — M6281 Muscle weakness (generalized): Secondary | ICD-10-CM | POA: Diagnosis not present

## 2020-12-27 DIAGNOSIS — M25511 Pain in right shoulder: Secondary | ICD-10-CM

## 2020-12-27 DIAGNOSIS — R600 Localized edema: Secondary | ICD-10-CM

## 2020-12-27 DIAGNOSIS — M25611 Stiffness of right shoulder, not elsewhere classified: Secondary | ICD-10-CM

## 2020-12-27 DIAGNOSIS — G8929 Other chronic pain: Secondary | ICD-10-CM

## 2020-12-27 NOTE — Therapy (Signed)
[facility] [place] [address] [place], [place], [address] Phone: [phone]   Fax:  [phone]  Physical Therapy Treatment  Patient Details  Name: Adrian [name] MRN: [id number] Date of Birth: 1960-06-28 Referring Provider (PT): Dr Ramond Marrow   Encounter Date: 12/27/2020   PT End of Session - 12/27/20 1013     Visit Number 12    Number of Visits 24    Date for PT Re-Evaluation 02/14/21    Authorization Type BCBS    PT Start Time 1010    PT Stop Time 1100    PT Time Calculation (min) 50 min    Activity Tolerance Patient tolerated treatment well             Past Medical History:  Diagnosis Date   Chronic right hip pain 08/06/2017   Chronic right-sided low back pain without sciatica 08/06/2017   GERD (gastroesophageal reflux disease)    Hypertension    Hypertriglyceridemia 08/06/2017   Seasonal allergies     Past Surgical History:  Procedure Laterality Date   ESOPHAGOGASTRODUODENOSCOPY     NO PAST SURGERIES     SHOULDER ARTHROSCOPY WITH SUBACROMIAL DECOMPRESSION AND BICEP TENDON REPAIR Left 05/31/2020   Procedure: LEFT SHOULDER ARTHROSCOPY WITH DEBRIDEMENT, DISTAL CLAVICAL EXCISION,  SUBACROMIAL DECOMPRESSION, BICEPS TENODESIS;  Surgeon: [name], MD;  Location: [facility];  Service: Orthopedics;  Laterality: Left;    There were no vitals filed for this visit.   Subjective Assessment - 12/27/20 1014     Subjective Some soreness and discomfort in Rt shoulder from sleeping on the Rt shoulder some last night.    Currently in Pain? Yes    Pain Score 1     Pain Location Shoulder    Pain Orientation Right    Pain Descriptors / Indicators Aching;Sore                               [facility] Adult PT Treatment/Exercise - 12/27/20 0001       Shoulder Exercises: Sidelying   External Rotation Strengthening;Right;10 reps    External Rotation Weight (lbs) 1    ABduction  Strengthening;Right;10 reps   elbow flexed - forearm level     Shoulder Exercises: Standing   External Rotation Strengthening;Right;10 reps;Theraband    Theraband Level (Shoulder External Rotation) Level 2 (Red)    External Rotation Limitations reactive isometric    Internal Rotation Strengthening;Right;10 reps;Theraband    Internal Rotation Limitations reactive isometric    Flexion Strengthening;Right;10 reps   elevation to ~ 70 deg VC to keep scapulae down and back   Shoulder Flexion Weight (lbs) 1 Rt; 2 Lt    ABduction Strengthening;Right;10 reps   ~ 60 deg VC to keep scapulae down and back   Shoulder ABduction Weight (lbs) 1 Rt; 2 Lt    Extension Strengthening;Both;10 reps;Theraband    Theraband Level (Shoulder Extension) Level 2 (Red)    Row Strengthening;Both;10 reps;Theraband    Theraband Level (Shoulder Row) Level 2 (Red)    Retraction Strengthening;Both;10 reps;Theraband    Theraband Level (Shoulder Retraction) Level 2 (Red)      Shoulder Exercises: Pulleys   Flexion --   10 reps x 10 sec   Scaption --   10 reps 10 sec hold   Other Pulley Exercises gentle ab/add at ~ 80 deg      Shoulder Exercises: Therapy Ball   Flexion Right  Flexion Limitations at chest height rolling ball on wall up/down; in/out; circles CW/CCW x30 each x 2 sets    Other Therapy Ball Exercises ER rolling coregous ball against dorsum of hand, small circles x 1-2 min x 2 reps      Shoulder Exercises: ROM/Strengthening   Pushups 15 reps    Pushups Limitations wall push up standing      Shoulder Exercises: Stretch   Corner Stretch 2 reps;30 seconds    Corner Stretch Limitations lower two positions    Other Shoulder Stretches Rt bicep stretch holding counter x 30 sec x 2      Vasopneumatic   Number Minutes Vasopneumatic  10 minutes    Vasopnuematic Location  Shoulder   Rt   Vasopneumatic Pressure Low    Vasopneumatic Temperature  34      Manual Therapy   Manual Therapy Soft tissue  mobilization;Joint mobilization;Passive ROM;Scapular mobilization    Manual therapy comments to reduce muscle guarding and improve ROM    Joint Mobilization circumduction of Rt GH joint with distraction to encourage muscle relaxation    Soft tissue mobilization anterior chest/pecs, middle deltoid; biceps; upper trap    Scapular Mobilization Rt    Passive ROM R shoulder flexion, scaption in scapular plane; ER in scapular plane                      PT Short Term Goals - 11/22/20 1059       PT SHORT TERM GOAL #1   Title Instruct patient in AROM Rt forearm, wrist, hand    Time 4    Period Weeks    Status Achieved      PT SHORT TERM GOAL #2   Title begin Rt shoulder rehab as directed my Dr Everardo Pacific    Time 6    Period Weeks    Status Achieved      PT SHORT TERM GOAL #3   Title Independent in postural correction    Time 6    Period Weeks    Status On-going      PT SHORT TERM GOAL #4   Title Independent in self dressing and grooming    Time 6    Period Weeks    Status Achieved               PT Long Term Goals - 11/22/20 1100       PT LONG TERM GOAL #1   Title independent with initial HEP    Time 6    Period Weeks    Status On-going    Target Date 01/03/21      PT LONG TERM GOAL #2   Title demo Lt shoulder strength =/> 4/5 to allow initial return to activities at work and in his yard    Time 12    Period Weeks    Status New    Target Date 02/14/21      PT LONG TERM GOAL #3   Title Rt shoulder ROM WFL's and painfree    Time 12    Period Weeks    Status New    Target Date 02/14/21      PT LONG TERM GOAL #4   Title patient reports ability to rest and sleep with minimal to no pain in Rt shoulder    Time 12    Period Weeks    Status New    Target Date 02/14/21      PT LONG TERM GOAL #5  Title Independent in all strengthening exercises and activities prior to d/c    Time 12    Period Weeks    Status New    Target Date 02/14/21                    Plan - 12/27/20 1016     Clinical Impression Statement Some soreness in Rt shoulder today from sleeping on the Rt side last night. Continued work on stretching and strengthening per protocol.    Rehab Potential Good    PT Frequency 2x / week    PT Duration 12 weeks    PT Treatment/Interventions Iontophoresis 4mg /ml Dexamethasone;Taping;Vasopneumatic Device;Patient/family education;Moist Heat;Therapeutic activities;Passive range of motion;Dry needling;Therapeutic exercise;Cryotherapy;Electrical Stimulation;Neuromuscular re-education;Manual techniques    PT Next Visit Plan Progress with Rt shoulder rehab per MD protocol.- add light resistive exercises at 10-12 weeks. 12 weeks on 12/31/20. 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 04/04/2020   Primary osteoarthritis involving multiple joints 07/26/2018   Acute left ankle pain 07/26/2018   Cerumen debris on tympanic membrane of right ear 07/26/2018   Tinea pedis 07/21/2018   Periscapular pain 07/20/2018   Retrocalcaneal bursitis, left 07/20/2018   Hepatitis B core antibody positive 07/20/2018   Polyarthralgia 07/20/2018   At risk for obstructive sleep apnea 07/20/2018   Hearing loss due to cerumen impaction, right 11/02/2017   Seasonal allergic rhinitis due to pollen 11/02/2017   Fasting hyperglycemia 08/06/2017   Primary osteoarthritis of right hip 08/06/2017   Chronic right-sided low back  pain without sciatica 08/06/2017   Hypertriglyceridemia 08/06/2017   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   Gastroesophageal reflux disease without esophagitis 07/09/2017   Hypertension goal BP (blood pressure) < 130/80 07/09/2017   Class 2 obesity due to excess calories in adult 07/09/2017    Yashika Mask 07/11/2017 PT, MPH  12/27/2020, 10:56 AM  [facility] [address] [place], [place], [place] Phone: [phone]   Fax:  [phone]  Name: Adrian [name] MRN: Frankey Poot Date of Birth: [birth year]

## 2020-12-31 ENCOUNTER — Encounter: Payer: Self-pay | Admitting: Rehabilitative and Restorative Service Providers"

## 2020-12-31 ENCOUNTER — Other Ambulatory Visit: Payer: Self-pay

## 2020-12-31 ENCOUNTER — Ambulatory Visit (INDEPENDENT_AMBULATORY_CARE_PROVIDER_SITE_OTHER): Payer: BC Managed Care – PPO | Admitting: Rehabilitative and Restorative Service Providers"

## 2020-12-31 DIAGNOSIS — M25511 Pain in right shoulder: Secondary | ICD-10-CM

## 2020-12-31 DIAGNOSIS — G8929 Other chronic pain: Secondary | ICD-10-CM

## 2020-12-31 DIAGNOSIS — R600 Localized edema: Secondary | ICD-10-CM | POA: Diagnosis not present

## 2020-12-31 DIAGNOSIS — M6281 Muscle weakness (generalized): Secondary | ICD-10-CM

## 2020-12-31 DIAGNOSIS — M25611 Stiffness of right shoulder, not elsewhere classified: Secondary | ICD-10-CM | POA: Diagnosis not present

## 2020-12-31 NOTE — Therapy (Signed)
[facility] [place] [address] [place], [place], [address] Phone: [phone]   Fax:  [phone]  Physical Therapy Treatment  Patient Details  Name: Adrian [name] MRN: [id number] Date of Birth: 04/10/1961 Referring Provider (PT): Dr Ramond Marrow   Encounter Date: 12/31/2020   PT End of Session - 12/31/20 1020     Visit Number 13    Number of Visits 24    Date for PT Re-Evaluation 02/14/21    Authorization Type BCBS    PT Start Time 1012    PT Stop Time 1100    PT Time Calculation (min) 48 min    Activity Tolerance Patient tolerated treatment well             Past Medical History:  Diagnosis Date   Chronic right hip pain 08/06/2017   Chronic right-sided low back pain without sciatica 08/06/2017   GERD (gastroesophageal reflux disease)    Hypertension    Hypertriglyceridemia 08/06/2017   Seasonal allergies     Past Surgical History:  Procedure Laterality Date   ESOPHAGOGASTRODUODENOSCOPY     NO PAST SURGERIES     SHOULDER ARTHROSCOPY WITH SUBACROMIAL DECOMPRESSION AND BICEP TENDON REPAIR Left 05/31/2020   Procedure: LEFT SHOULDER ARTHROSCOPY WITH DEBRIDEMENT, DISTAL CLAVICAL EXCISION,  SUBACROMIAL DECOMPRESSION, BICEPS TENODESIS;  Surgeon: [name], MD;  Location: [facility];  Service: Orthopedics;  Laterality: Left;    There were no vitals filed for this visit.   Subjective Assessment - 12/31/20 1021     Subjective SOme continued soreness in the Rt shoulder - no pain. working on exercises at home.    Currently in Pain? No/denies    Pain Score 0-No pain    Pain Location Shoulder    Pain Orientation Right                [facility] PT Assessment - 12/31/20 0001       Assessment   Medical Diagnosis Rt/Lt RTC repair with bicep tendodesis and DCR    Referring Provider (PT) Dr Ramond Marrow    Onset Date/Surgical Date 10/08/20    Hand Dominance Right    Next MD Visit 01/01/21    Prior Therapy  here for Lt shoulder      AROM   Right Shoulder Extension 62 Degrees    Right Shoulder Flexion 141 Degrees    Right Shoulder ABduction 91 Degrees    Right Shoulder Internal Rotation --   hand to waist   Right Shoulder External Rotation 62 Degrees   arm at side     PROM   Right Shoulder Flexion 152 Degrees    Right Shoulder ABduction 156 Degrees   in scapular plane   Right Shoulder External Rotation 90 Degrees   in scapular plane                          [facility] Adult PT Treatment/Exercise - 12/31/20 0001       Shoulder Exercises: Supine   Other Supine Exercises scapular rhythmic stabilization - flexion/extension; horizontal ab/add; circles CW/CCW x ~ 20-25 reps    Other Supine Exercises supine snow angel 1-2 min ~ 50 deg x 3 reps      Shoulder Exercises: Sidelying   External Rotation Strengthening;Right;10 reps    External Rotation Weight (lbs) 1    ABduction Strengthening;Right;10 reps   elbow flexed - forearm level     Shoulder Exercises: Standing   External  Rotation Strengthening;Right;10 reps;Theraband    Theraband Level (Shoulder External Rotation) Level 2 (Red)    Flexion Strengthening;Right;10 reps   elevation to ~ 70 deg VC to keep scapulae down and back   Shoulder Flexion Weight (lbs) 1 Rt; 2 Lt    ABduction Strengthening;Right;10 reps   in scapular plane; ~ 80 deg VC to keep scapulae down and back   Shoulder ABduction Weight (lbs) 1 Rt; 2 Lt    Extension Strengthening;Both;10 reps;Theraband    Theraband Level (Shoulder Extension) Level 3 (Green)    Row Strengthening;Both;10 reps;Theraband    Theraband Level (Shoulder Row) Level 3 (Green)    Retraction Strengthening;Both;10 reps;Theraband    Theraband Level (Shoulder Retraction) Level 2 (Red)    Retraction Limitations isometric; minimal movement      Shoulder Exercises: Pulleys   Flexion --   10 reps x 10 sec   Scaption --   10 reps 10 sec hold   Other Pulley Exercises gentle ab/add at ~ 80 deg       Shoulder Exercises: Therapy Ball   Flexion Right    Flexion Limitations at chest height rolling ball on wall up/down; in/out; circles CW/CCW x30 each x 2 sets    Other Therapy Ball Exercises ER rolling coregous ball against dorsum of hand, small circles x 1-2 min x 2 reps      Shoulder Exercises: ROM/Strengthening   Pushups 15 reps    Pushups Limitations wall push up standing      Shoulder Exercises: Stretch   Corner Stretch 2 reps;30 seconds    Corner Stretch Limitations lower two positions    Internal Rotation Stretch 3 reps   10 sec   Other Shoulder Stretches Rt bicep stretch holding counter x 30 sec x 2      Vasopneumatic   Number Minutes Vasopneumatic  10 minutes    Vasopnuematic Location  Shoulder   Rt   Vasopneumatic Pressure Low    Vasopneumatic Temperature  34      Manual Therapy   Manual Therapy Soft tissue mobilization;Joint mobilization;Passive ROM;Scapular mobilization    Manual therapy comments to reduce muscle guarding and improve ROM    Joint Mobilization circumduction of Rt GH joint with distraction to encourage muscle relaxation    Soft tissue mobilization anterior chest/pecs, middle deltoid; biceps; upper trap    Scapular Mobilization Rt    Passive ROM R shoulder flexion, scaption in scapular plane; ER in scapular plane                      PT Short Term Goals - 11/22/20 1059       PT SHORT TERM GOAL #1   Title Instruct patient in AROM Rt forearm, wrist, hand    Time 4    Period Weeks    Status Achieved      PT SHORT TERM GOAL #2   Title begin Rt shoulder rehab as directed my Dr Everardo Pacific    Time 6    Period Weeks    Status Achieved      PT SHORT TERM GOAL #3   Title Independent in postural correction    Time 6    Period Weeks    Status On-going      PT SHORT TERM GOAL #4   Title Independent in self dressing and grooming    Time 6    Period Weeks    Status Achieved  PT Long Term Goals - 11/22/20 1100        PT LONG TERM GOAL #1   Title independent with initial HEP    Time 6    Period Weeks    Status On-going    Target Date 01/03/21      PT LONG TERM GOAL #2   Title demo Lt shoulder strength =/> 4/5 to allow initial return to activities at work and in his yard    Time 12    Period Weeks    Status New    Target Date 02/14/21      PT LONG TERM GOAL #3   Title Rt shoulder ROM WFL's and painfree    Time 12    Period Weeks    Status New    Target Date 02/14/21      PT LONG TERM GOAL #4   Title patient reports ability to rest and sleep with minimal to no pain in Rt shoulder    Time 12    Period Weeks    Status New    Target Date 02/14/21      PT LONG TERM GOAL #5   Title Independent in all strengthening exercises and activities prior to d/c    Time 12    Period Weeks    Status New    Target Date 02/14/21                   Plan - 12/31/20 1022     Clinical Impression Statement Continued gradual porgress with ROM and strength per protocol. Progressing well with shoulder rehab.    Rehab Potential Good    PT Frequency 2x / week    PT Duration 12 weeks    PT Treatment/Interventions Iontophoresis 4mg /ml Dexamethasone;Taping;Vasopneumatic Device;Patient/family education;Moist Heat;Therapeutic activities;Passive range of motion;Dry needling;Therapeutic exercise;Cryotherapy;Electrical Stimulation;Neuromuscular re-education;Manual techniques    PT Next Visit Plan 12 weeks post op Rt shoulder 12/31/20; progress with stretching and strengthening per protocol. 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 04/04/2020   Primary osteoarthritis involving multiple joints 07/26/2018   Acute left ankle pain 07/26/2018   Cerumen debris on tympanic membrane of right ear 07/26/2018   Tinea pedis 07/21/2018   Periscapular pain 07/20/2018   Retrocalcaneal bursitis, left 07/20/2018   Hepatitis B core antibody positive 07/20/2018   Polyarthralgia 07/20/2018   At risk for obstructive sleep apnea 07/20/2018   Hearing loss due to cerumen impaction, right 11/02/2017   Seasonal allergic rhinitis due to pollen 11/02/2017   Fasting hyperglycemia 08/06/2017   Primary osteoarthritis of right hip 08/06/2017   Chronic right-sided low back pain without sciatica 08/06/2017   Hypertriglyceridemia 08/06/2017   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   Gastroesophageal reflux disease without esophagitis 07/09/2017   Hypertension goal BP (blood pressure) < 130/80 07/09/2017   Class 2 obesity due to excess calories in adult 07/09/2017    Annlee Glandon P Leonor LivHolt PT,  MPH  12/31/2020, 10:57 AM  [facility] [address] [place], [place], [address] Phone: [phone]   Fax:  [phone]  Name: Adrian [name] MRN: [id number] Date of Birth: 1961/05/10

## 2021-01-01 ENCOUNTER — Other Ambulatory Visit: Payer: Self-pay | Admitting: Osteopathic Medicine

## 2021-01-02 NOTE — Telephone Encounter (Signed)
Written by external provider.

## 2021-01-03 ENCOUNTER — Ambulatory Visit: Payer: BC Managed Care – PPO | Admitting: Physical Therapy

## 2021-01-03 ENCOUNTER — Encounter: Payer: Self-pay | Admitting: Physical Therapy

## 2021-01-03 ENCOUNTER — Other Ambulatory Visit: Payer: Self-pay

## 2021-01-03 DIAGNOSIS — G8929 Other chronic pain: Secondary | ICD-10-CM | POA: Diagnosis not present

## 2021-01-03 DIAGNOSIS — M25611 Stiffness of right shoulder, not elsewhere classified: Secondary | ICD-10-CM | POA: Diagnosis not present

## 2021-01-03 DIAGNOSIS — M6281 Muscle weakness (generalized): Secondary | ICD-10-CM | POA: Diagnosis not present

## 2021-01-03 DIAGNOSIS — M25511 Pain in right shoulder: Secondary | ICD-10-CM

## 2021-01-03 NOTE — Therapy (Signed)
Adrian [name], [place], [address] Phone: [phone]   Fax:  [phone]  Physical Therapy Treatment  Patient Details  Name: Adrian [name] MRN: [id number] Date of Birth: [birth year] Referring Provider (PT): Dr Ophelia Charter   Encounter Date: 01/03/2021   PT End of Session - 01/03/21 1048     Visit Number 14    Number of Visits 24    Date for PT Re-Evaluation 02/14/21    Authorization Type BCBS    PT Start Time 1017    PT Stop Time 1100    PT Time Calculation (min) 43 min    Activity Tolerance Patient tolerated treatment well    Behavior During Therapy [facility] for tasks assessed/performed             Past Medical History:  Diagnosis Date   Chronic right hip pain 08/06/2017   Chronic right-sided low back pain without sciatica 08/06/2017   GERD (gastroesophageal reflux disease)    Hypertension    Hypertriglyceridemia 08/06/2017   Seasonal allergies     Past Surgical History:  Procedure Laterality Date   ESOPHAGOGASTRODUODENOSCOPY     NO PAST SURGERIES     SHOULDER ARTHROSCOPY WITH SUBACROMIAL DECOMPRESSION AND BICEP TENDON REPAIR Left 05/31/2020   Procedure: LEFT SHOULDER ARTHROSCOPY WITH DEBRIDEMENT, DISTAL CLAVICAL EXCISION,  SUBACROMIAL DECOMPRESSION, BICEPS TENODESIS;  Surgeon: Hiram Gash, MD;  Location: [place];  Service: Orthopedics;  Laterality: Left;    There were no vitals filed for this visit.   Subjective Assessment - 01/03/21 1019     Subjective Pt reported he saw the surgeon.  He was pleased with his progress; cautioned him to be careful with yardwork.  He reports his Rt shoulder is a little sore today.    Currently in Pain? Yes    Pain Score 1     Pain Location Shoulder    Pain Orientation Right;Left    Pain Descriptors / Indicators Sore                [facility] PT Assessment - 01/03/21 0001       Assessment   Medical Diagnosis Rt/Lt RTC repair with  bicep tendodesis and DCR    Referring Provider (PT) Dr Ophelia Charter    Onset Date/Surgical Date 10/08/20    Hand Dominance Right    Next MD Visit PRN    Prior Therapy here for Lt shoulder              [facility] Adult PT Treatment/Exercise - 01/03/21 0001       Shoulder Exercises: Sidelying   External Rotation Strengthening;Right;10 reps    External Rotation Weight (lbs) 1    Other Sidelying Exercises Open book x 10 reps Rt, 5 reps Lt.      Shoulder Exercises: Standing   Extension Strengthening;Both;10 reps   with single leg step forward.   Theraband Level (Shoulder Extension) Level 3 (Green)    Row Strengthening;Right;10 reps;Left   bow and arrow   Theraband Level (Shoulder Row) Level 3 (Green)      Shoulder Exercises: Pulleys   Flexion --   10 reps x 10 sec   Scaption --   5 reps 10 sec hold     Shoulder Exercises: ROM/Strengthening   UBE (Upper Arm Bike) L1: 1 min forward/ 1 min backward, 2 sets standing    Wall Pushups 10 reps      Shoulder Exercises: IT sales professional 2  reps;30 seconds    Internal Rotation Stretch 3 reps   15 sec   Other Shoulder Stretches Rt bicep stretch holding counter x 30 sec x 2      Shoulder Exercises: Body Blade   External Rotation 2 reps;15 seconds   elbow flexed 90, arm in neutral   External Rotation Limitations Rt bicep fatigued quickly, became painful- stopped and stretched)      Manual Therapy   Soft tissue mobilization IASTM to Lt upper trap/levator, Rt biceps brachii and bracialis, ant deltoid and pec major to decrease fascial restrictions and improve mobility.              PT Short Term Goals - 11/22/20 1059       PT SHORT TERM GOAL #1   Title Instruct patient in AROM Rt forearm, wrist, hand    Time 4    Period Weeks    Status Achieved      PT SHORT TERM GOAL #2   Title begin Rt shoulder rehab as directed my Dr Griffin Basil    Time 6    Period Weeks    Status Achieved      PT SHORT TERM GOAL #3   Title Independent in  postural correction    Time 6    Period Weeks    Status On-going      PT SHORT TERM GOAL #4   Title Independent in self dressing and grooming    Time 6    Period Weeks    Status Achieved               PT Long Term Goals - 01/03/21 1157       PT LONG TERM GOAL #1   Title independent with initial HEP    Time 6    Period Weeks    Status Achieved      PT LONG TERM GOAL #2   Title demo Lt shoulder strength =/> 4/5 to allow initial return to activities at work and in his yard    Time 12    Period Weeks    Status On-going      PT LONG TERM GOAL #3   Title Rt shoulder ROM WFL's and painfree    Time 12    Period Weeks    Status Partially Met      PT [place] #4   Title patient reports ability to rest and sleep with minimal to no pain in Rt shoulder    Time 12    Period Weeks    Status Partially Met      PT LONG TERM GOAL #5   Title Independent in all strengthening exercises and activities prior to d/c    Time 12    Period Weeks    Status On-going                   Plan - 01/03/21 1049     Clinical Impression Statement Pt is now in 3rd phase of rehab protocol.  He reports some discomfort in ant Rt shoulder with IR stretch.  He reported fatigue and mild pain in Rt bicep with body blade (arm neutral, elbow flexed to 90); reduced with bicep stretch and rest.  Pt declined vaso/ice at end of session; will ice at home if needed. Pt making good progress towards goals.    Rehab Potential Good    PT Frequency 2x / week    PT Duration 12 weeks    PT Treatment/Interventions  Iontophoresis 29m/ml Dexamethasone;Taping;Vasopneumatic Device;Patient/family education;Moist Heat;Therapeutic activities;Passive range of motion;Dry needling;Therapeutic exercise;Cryotherapy;Electrical Stimulation;Neuromuscular re-education;Manual techniques    PT Next Visit Plan continue to gradually progress strengthening per protocol.    PT Home Exercise Plan [id number] - patient will  refer to previous exercises does not want reprinted exercises    Consulted and Agree with Plan of Care Patient             Patient will benefit from skilled therapeutic intervention in order to improve the following deficits and impairments:  Decreased range of motion, Impaired UE functional use, Increased muscle spasms, Pain, Postural dysfunction, Decreased strength  Visit Diagnosis: Stiffness of right shoulder joint  Muscle weakness (generalized)  Chronic right shoulder pain     Problem List Patient Active Problem List   Diagnosis Date Noted   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 04/04/2020   Primary osteoarthritis involving multiple joints 07/26/2018   Acute left ankle pain 07/26/2018   Cerumen debris on tympanic membrane of right ear 07/26/2018   Tinea pedis 07/21/2018   Periscapular pain 07/20/2018   Retrocalcaneal bursitis, left 07/20/2018   Hepatitis B core antibody positive 07/20/2018   Polyarthralgia 07/20/2018   At risk for obstructive sleep apnea 07/20/2018   Hearing loss due to cerumen impaction, right 11/02/2017   Seasonal allergic rhinitis due to pollen 11/02/2017   Fasting hyperglycemia 08/06/2017   Primary osteoarthritis of right hip 08/06/2017   Chronic right-sided low back pain without sciatica 08/06/2017   Hypertriglyceridemia 08/06/2017   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   Gastroesophageal reflux disease without esophagitis 07/09/2017   Hypertension goal BP (blood pressure) < 130/80 07/09/2017   Class 2 obesity due to excess calories in adult 07/09/2017   JKerin Perna PTA 01/03/21 11:58 AM  [address] [address]Phone: [phone]  Fax:  [phone] Name: GSIDDH VANDEVENTERMRN: [id number] of Birth: [birth year]

## 2021-01-08 ENCOUNTER — Other Ambulatory Visit: Payer: Self-pay

## 2021-01-08 ENCOUNTER — Ambulatory Visit: Payer: BC Managed Care – PPO | Admitting: Physical Therapy

## 2021-01-08 DIAGNOSIS — M25611 Stiffness of right shoulder, not elsewhere classified: Secondary | ICD-10-CM | POA: Diagnosis not present

## 2021-01-08 DIAGNOSIS — M6281 Muscle weakness (generalized): Secondary | ICD-10-CM

## 2021-01-08 DIAGNOSIS — M25511 Pain in right shoulder: Secondary | ICD-10-CM | POA: Diagnosis not present

## 2021-01-08 DIAGNOSIS — G8929 Other chronic pain: Secondary | ICD-10-CM | POA: Diagnosis not present

## 2021-01-08 NOTE — Therapy (Signed)
[place], [place], [address] Phone: [phone]   Fax:  [phone]  Physical Therapy Treatment  Patient Details  Name: Adrian [name] MRN: [id number] Date of Birth: [birth year] Referring Provider (PT): Dr Ophelia Charter   Encounter Date: 01/08/2021   PT End of Session - 01/08/21 1023     Visit Number 15    Number of Visits 24    Date for PT Re-Evaluation 02/14/21    Authorization Type BCBS    PT Start Time 1015    PT Stop Time 1055    PT Time Calculation (min) 40 min    Activity Tolerance Patient tolerated treatment well    Behavior During Therapy [facility] for tasks assessed/performed             Past Medical History:  Diagnosis Date   Chronic right hip pain 08/06/2017   Chronic right-sided low back pain without sciatica 08/06/2017   GERD (gastroesophageal reflux disease)    Hypertension    Hypertriglyceridemia 08/06/2017   Seasonal allergies     Past Surgical History:  Procedure Laterality Date   ESOPHAGOGASTRODUODENOSCOPY     NO PAST SURGERIES     SHOULDER ARTHROSCOPY WITH SUBACROMIAL DECOMPRESSION AND BICEP TENDON REPAIR Left 05/31/2020   Procedure: LEFT SHOULDER ARTHROSCOPY WITH DEBRIDEMENT, DISTAL CLAVICAL EXCISION,  SUBACROMIAL DECOMPRESSION, BICEPS TENODESIS;  Surgeon: Hiram Gash, MD;  Location: [facility];  Service: Orthopedics;  Laterality: Left;    There were no vitals filed for this visit.   Subjective Assessment - 01/08/21 1020     Subjective Pt reports his Rt shoulder is sore. The soreness started when sleeping on it last night.    Currently in Pain? Yes    Pain Score 1     Pain Location Shoulder    Pain Orientation Right;Anterior    Pain Descriptors / Indicators Sore    Aggravating Factors  varies    Pain Relieving Factors varies                [facility] PT Assessment - 01/08/21 0001       Assessment   Medical Diagnosis Rt/Lt RTC repair with bicep  tendodesis and DCR    Referring Provider (PT) Dr Ophelia Charter    Onset Date/Surgical Date 10/08/20    Hand Dominance Right    Next MD Visit PRN    Prior Therapy here for Lt shoulder             [facility] Adult PT Treatment/Exercise - 01/08/21 0001       Lumbar Exercises: Quadruped   Single Arm Raise Right;Left;5 reps;2 seconds    Other Quadruped Lumbar Exercises weight shifts into shoulder x3 sec x 10 reps,      Shoulder Exercises: Standing   External Rotation Strengthening;Right;15 reps;10 reps   2 sets   Theraband Level (Shoulder External Rotation) Level 2 (Red)    Internal Rotation Strengthening;Right;15 reps;Theraband   2 sets   Theraband Level (Shoulder Internal Rotation) Level 2 (Red)    Flexion Strengthening;Right;10 reps   2 sets   Shoulder Flexion Weight (lbs) 1    Flexion Limitations to shoulder height    ABduction Strengthening;Right;10 reps   scaption, 2 sets   Shoulder ABduction Weight (lbs) 1    ABduction Limitations to shoulder height.    Row Strengthening;Right;Left;15 reps   2 sets   Theraband Level (Shoulder Row) Level 3 (Green)    Other Standing Exercises wall ladder flexion /  scaption x 2 reps each arm, each direction.      Shoulder Exercises: ROM/Strengthening   UBE (Upper Arm Bike) L2: 1.5 min forward, 1.5 min backward.      Shoulder Exercises: Stretch   Corner Stretch 2 reps;20 seconds   2 positions   Other Shoulder Stretches Rt/Lt bicep stretch x 20 sec x 2 reps                      PT Short Term Goals - 11/22/20 1059       PT SHORT TERM GOAL #1   Title Instruct patient in AROM Rt forearm, wrist, hand    Time 4    Period Weeks    Status Achieved      PT SHORT TERM GOAL #2   Title begin Rt shoulder rehab as directed my Dr Griffin Basil    Time 6    Period Weeks    Status Achieved      PT SHORT TERM GOAL #3   Title Independent in postural correction    Time 6    Period Weeks    Status On-going      PT SHORT TERM GOAL #4   Title  Independent in self dressing and grooming    Time 6    Period Weeks    Status Achieved               PT Long Term Goals - 01/08/21 1039       PT LONG TERM GOAL #1   Title independent with initial HEP    Time 6    Period Weeks    Status Achieved      PT LONG TERM GOAL #2   Title demo Lt shoulder strength =/> 4/5 to allow initial return to activities at work and in his yard    Time 12    Period Weeks    Status On-going      PT LONG TERM GOAL #3   Title Rt shoulder ROM WFL's and painfree    Time 12    Period Weeks    Status Partially Met      PT LONG TERM GOAL #4   Title patient reports ability to rest and sleep with minimal to no pain in Rt shoulder    Time 12    Period Weeks    Status Achieved      PT LONG TERM GOAL #5   Title Independent in all strengthening exercises and activities prior to d/c    Time 12    Period Weeks    Status On-going                   Plan - 01/08/21 1037     Clinical Impression Statement Progressed light resistance exercises, per protocol, without difficulty. Encouraged pt to complete ROM/ stretches daily.  Created a circuit of resistance exercises pt could complete 3x/wk.  Will revisit next session to assess response. Progressing well towards goals.    Rehab Potential Good    PT Frequency 2x / week    PT Duration 12 weeks    PT Treatment/Interventions Iontophoresis 52m/ml Dexamethasone;Taping;Vasopneumatic Device;Patient/family education;Moist Heat;Therapeutic activities;Passive range of motion;Dry needling;Therapeutic exercise;Cryotherapy;Electrical Stimulation;Neuromuscular re-education;Manual techniques    PT Next Visit Plan continue to gradually progress strengthening per protocol.    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 04/04/2020   Primary osteoarthritis involving multiple joints 07/26/2018   Acute left ankle pain 07/26/2018   Cerumen debris on tympanic membrane of right ear 07/26/2018   Tinea pedis 07/21/2018   Periscapular pain 07/20/2018   Retrocalcaneal bursitis, left 07/20/2018   Hepatitis B core antibody positive 07/20/2018   Polyarthralgia 07/20/2018   At risk for obstructive sleep apnea 07/20/2018   Hearing loss due to cerumen impaction, right 11/02/2017   Seasonal allergic rhinitis due to pollen 11/02/2017   Fasting hyperglycemia 08/06/2017   Primary osteoarthritis of right hip 08/06/2017   Chronic right-sided low back pain without sciatica 08/06/2017   Hypertriglyceridemia 08/06/2017   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   Gastroesophageal reflux disease without esophagitis 07/09/2017   Hypertension goal BP (blood pressure) < 130/80 07/09/2017   Class 2 obesity due to excess calories in adult 07/09/2017    Kerin Perna, PTA 01/08/21 10:58 AM   [place], [place], [address] Phone: [phone]   Fax:  [phone]  Name: Adrian [name] MRN: [id number] Date of Birth: 1961/05/04

## 2021-01-10 ENCOUNTER — Other Ambulatory Visit: Payer: Self-pay

## 2021-01-10 ENCOUNTER — Ambulatory Visit: Payer: BC Managed Care – PPO | Admitting: Rehabilitative and Restorative Service Providers"

## 2021-01-10 ENCOUNTER — Encounter: Payer: Self-pay | Admitting: Rehabilitative and Restorative Service Providers"

## 2021-01-10 DIAGNOSIS — M25611 Stiffness of right shoulder, not elsewhere classified: Secondary | ICD-10-CM

## 2021-01-10 DIAGNOSIS — M6281 Muscle weakness (generalized): Secondary | ICD-10-CM | POA: Diagnosis not present

## 2021-01-10 DIAGNOSIS — M25511 Pain in right shoulder: Secondary | ICD-10-CM | POA: Diagnosis not present

## 2021-01-10 DIAGNOSIS — G8929 Other chronic pain: Secondary | ICD-10-CM

## 2021-01-10 DIAGNOSIS — R600 Localized edema: Secondary | ICD-10-CM

## 2021-01-10 NOTE — Therapy (Signed)
[place], [place], [address] Phone: [phone]   Fax:  [phone]  Physical Therapy Treatment  Patient Details  Name: Adrian [name] MRN: [id number] Date of Birth: 1961-02-17 Referring Provider (PT): Dr Ophelia Charter   Encounter Date: 01/10/2021   PT End of Session - 01/10/21 1024     Visit Number 16    Number of Visits 24    Date for PT Re-Evaluation 02/14/21    PT Start Time 7902    PT Stop Time 1100    PT Time Calculation (min) 45 min    Activity Tolerance Patient tolerated treatment well             Past Medical History:  Diagnosis Date   Chronic right hip pain 08/06/2017   Chronic right-sided low back pain without sciatica 08/06/2017   GERD (gastroesophageal reflux disease)    Hypertension    Hypertriglyceridemia 08/06/2017   Seasonal allergies     Past Surgical History:  Procedure Laterality Date   ESOPHAGOGASTRODUODENOSCOPY     NO PAST SURGERIES     SHOULDER ARTHROSCOPY WITH SUBACROMIAL DECOMPRESSION AND BICEP TENDON REPAIR Left 05/31/2020   Procedure: LEFT SHOULDER ARTHROSCOPY WITH DEBRIDEMENT, DISTAL CLAVICAL EXCISION,  SUBACROMIAL DECOMPRESSION, BICEPS TENODESIS;  Surgeon: Hiram Gash, MD;  Location: [facility];  Service: Orthopedics;  Laterality: Left;    There were no vitals filed for this visit.   Subjective Assessment - 01/10/21 1025     Subjective Patient reports that he is working on exercises at home. [employer] is working wel lfor exercisees at home. 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    Currently in Pain? No/denies    Pain Score 0-No pain                               [facility] Adult PT Treatment/Exercise - 01/10/21 0001       Lumbar Exercises: Quadruped   Single Arm Raise Right;Left;10 reps;2 seconds      Shoulder Exercises: Standing   Protraction Limitations swinging 5# KB knees to chest level x 10 reps    External  Rotation Strengthening;Right;Left;10 reps;15 reps;Theraband   2 sets   Theraband Level (Shoulder External Rotation) Level 2 (Red)    Internal Rotation Strengthening;Right;Left;15 reps;Theraband   2 sets   Theraband Level (Shoulder Internal Rotation) Level 2 (Red)    Flexion Strengthening;Both;10 reps;Theraband   2 sets of 10   Shoulder Flexion Weight (lbs) 2    Flexion Limitations 2# wt suspended from TB to bamboo stick each side - overhead press    Extension Strengthening;Both;10 reps    Theraband Level (Shoulder Extension) Level 3 (Green)    Row Strengthening;Right;Left;15 reps   2 sets   Theraband Level (Shoulder Row) Level 3 (Green)    Retraction Strengthening;Both    Theraband Level (Shoulder Retraction) Level 2 (Red)    Other Standing Exercises wall ladder flexion / scaption x 2 reps each arm, each direction.    Other Standing Exercises biceps curls x 10 reps 2# - each side of bamboo stick      Shoulder Exercises: Therapy Ball   Other Therapy Ball Exercises ER rolling coregous ball against dorsum of hand, small circles x 15 sec x 2 reps      Shoulder Exercises: ROM/Strengthening   UBE (Upper Arm Bike) L2: 1.5 min forward, 1.5 min backward.    Wall Pushups 10  reps    Wall Pushups Limitations reverse wall push ups    Pushups 10 reps    Pushups Limitations wall push up standing    Other ROM/Strengthening Exercises bent row x 2# wt x 15 Rt/Lt      Shoulder Exercises: Stretch   Corner Stretch 2 reps;20 seconds   2 positions   Internal Rotation Stretch 3 reps   15 sec   Other Shoulder Stretches Rt/Lt bicep stretch x 20 sec x 2 reps                      PT Short Term Goals - 11/22/20 1059       PT SHORT TERM GOAL #1   Title Instruct patient in AROM Rt forearm, wrist, hand    Time 4    Period Weeks    Status Achieved      PT SHORT TERM GOAL #2   Title begin Rt shoulder rehab as directed my Dr Griffin Basil    Time 6    Period Weeks    Status Achieved      PT SHORT  TERM GOAL #3   Title Independent in postural correction    Time 6    Period Weeks    Status On-going      PT SHORT TERM GOAL #4   Title Independent in self dressing and grooming    Time 6    Period Weeks    Status Achieved               PT Long Term Goals - 01/08/21 1039       PT LONG TERM GOAL #1   Title independent with initial HEP    Time 6    Period Weeks    Status Achieved      PT LONG TERM GOAL #2   Title demo Lt shoulder strength =/> 4/5 to allow initial return to activities at work and in his yard    Time 12    Period Weeks    Status On-going      PT LONG TERM GOAL #3   Title Rt shoulder ROM WFL's and painfree    Time 12    Period Weeks    Status Partially Met      PT LONG TERM GOAL #4   Title patient reports ability to rest and sleep with minimal to no pain in Rt shoulder    Time 12    Period Weeks    Status Achieved      PT LONG TERM GOAL #5   Title Independent in all strengthening exercises and activities prior to d/c    Time 12    Period Weeks    Status On-going                   Plan - 01/10/21 1027     Clinical Impression Statement Progressing with resistive exercises. Circuit working well at home.    Rehab Potential Good    PT Frequency 2x / week    PT Duration 12 weeks    PT Treatment/Interventions Iontophoresis 37m/ml Dexamethasone;Taping;Vasopneumatic Device;Patient/family education;Moist Heat;Therapeutic activities;Passive range of motion;Dry needling;Therapeutic exercise;Cryotherapy;Electrical Stimulation;Neuromuscular re-education;Manual techniques    PT Next Visit Plan continue to gradually progress strengthening per protocol.    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 04/04/2020   Primary osteoarthritis involving multiple joints 07/26/2018   Acute left ankle pain 07/26/2018   Cerumen debris on tympanic membrane of right ear 07/26/2018   Tinea pedis 07/21/2018   Periscapular pain 07/20/2018   Retrocalcaneal bursitis, left 07/20/2018   Hepatitis B core antibody positive 07/20/2018   Polyarthralgia 07/20/2018   At risk for obstructive sleep apnea 07/20/2018   Hearing loss due to cerumen impaction, right 11/02/2017   Seasonal allergic rhinitis due to pollen 11/02/2017   Fasting hyperglycemia 08/06/2017   Primary osteoarthritis of right hip 08/06/2017   Chronic right-sided low back pain without sciatica 08/06/2017   Hypertriglyceridemia 08/06/2017   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   Gastroesophageal reflux disease without esophagitis 07/09/2017   Hypertension goal BP (blood pressure) < 130/80 07/09/2017   Class 2 obesity due to excess calories in adult 07/09/2017    Jaikob Borgwardt Nilda Simmer PT, MPH  01/10/2021, 11:01 AM  [facility] [address] [place], [place], [address] Phone: [phone]   Fax:  [phone]  Name: BRAYDON KULLMAN MRN: [id number] Date of Birth: 23-May-1961

## 2021-01-15 ENCOUNTER — Encounter: Payer: Self-pay | Admitting: Physical Therapy

## 2021-01-15 ENCOUNTER — Ambulatory Visit: Payer: BC Managed Care – PPO | Admitting: Physical Therapy

## 2021-01-15 ENCOUNTER — Other Ambulatory Visit: Payer: Self-pay

## 2021-01-15 DIAGNOSIS — G8929 Other chronic pain: Secondary | ICD-10-CM | POA: Diagnosis not present

## 2021-01-15 DIAGNOSIS — M6281 Muscle weakness (generalized): Secondary | ICD-10-CM | POA: Diagnosis not present

## 2021-01-15 DIAGNOSIS — M25511 Pain in right shoulder: Secondary | ICD-10-CM | POA: Diagnosis not present

## 2021-01-15 DIAGNOSIS — M25611 Stiffness of right shoulder, not elsewhere classified: Secondary | ICD-10-CM

## 2021-01-15 NOTE — Therapy (Signed)
[place], [place], [address] Phone: [phone]   Fax:  [phone]  Physical Therapy Treatment  Patient Details  Name: Adrian [name] MRN: [id number] Date of Birth: 1960-09-20 Referring Provider (PT): Dr Ophelia Charter   Encounter Date: 01/15/2021   PT End of Session - 01/15/21 1016     Visit Number 17    Number of Visits 24    Date for PT Re-Evaluation 02/14/21    Authorization Type [employer]    PT Start Time 1014    PT Stop Time 1055    PT Time Calculation (min) 41 min    Activity Tolerance Patient tolerated treatment well    Behavior During Therapy [facility] for tasks assessed/performed             Past Medical History:  Diagnosis Date   Chronic right hip pain 08/06/2017   Chronic right-sided low back pain without sciatica 08/06/2017   GERD (gastroesophageal reflux disease)    Hypertension    Hypertriglyceridemia 08/06/2017   Seasonal allergies     Past Surgical History:  Procedure Laterality Date   ESOPHAGOGASTRODUODENOSCOPY     NO PAST SURGERIES     SHOULDER ARTHROSCOPY WITH SUBACROMIAL DECOMPRESSION AND BICEP TENDON REPAIR Left 05/31/2020   Procedure: LEFT SHOULDER ARTHROSCOPY WITH DEBRIDEMENT, DISTAL CLAVICAL EXCISION,  SUBACROMIAL DECOMPRESSION, BICEPS TENODESIS;  Surgeon: Hiram Gash, MD;  Location: [facility];  Service: Orthopedics;  Laterality: Left;    There were no vitals filed for this visit.   Subjective Assessment - 01/15/21 1016     Subjective Pt reports he still has difficulty with putting a belt on with RUE.  He also notes weakness with weightbearing into RUE when in quadruped.    Patient Stated Goals use the Rt UE as normal.  HIking , build things, and shoot a gun and landscaping    Currently in Pain? No/denies    Pain Score 0-No pain                [facility] PT Assessment - 01/15/21 0001       Assessment   Medical Diagnosis Rt/Lt RTC repair with bicep  tendodesis and DCR    Referring Provider (PT) Dr Ophelia Charter    Onset Date/Surgical Date 10/08/20    Hand Dominance Right    Next MD Visit PRN    Prior Therapy here for Lt shoulder              [facility] Adult PT Treatment/Exercise - 01/15/21 0001       Lumbar Exercises: Quadruped   Other Quadruped Lumbar Exercises plank on elevated table with arm reaches down, side, and forward x 5 each arm; forearm plank on elevated table x 15 sec, followed by L stretch x 15 sec      Shoulder Exercises: Supine   Diagonals Strengthening;Right;10 reps    Theraband Level (Shoulder Diagonals) Level 1 (Yellow)      Shoulder Exercises: Standing   Protraction Limitations swinging 5# KB knees to chest level x 10 reps    External Rotation Strengthening;Right;Left;15 reps    Theraband Level (Shoulder External Rotation) Level 2 (Red)    Shoulder Flexion Weight (lbs) 2    Flexion Limitations 2# wt suspended from TB to bamboo stick each side - overhead press, 2 sets of 5.    Row Both;10 reps   bent over row with bamboo stick   Row Weight (lbs) 4   2# on each  end of bamboo   Diagonals Strengthening;Right;Left    Theraband Level (Shoulder Diagonals) Level 1 (Yellow)   3 reps LUE, 1 rep RUE - switched to supine   Other Standing Exercises biceps curls x 10 reps 2# - each side of bamboo stick, x 2 sets.  IR holding onto bamboo behind back x 10, with cues for elbows forward.    Other Standing Exercises rings on arch (12) - moving Lt/ Rt, LUE with 1# on wrist, RUE without wt. cues to avoid elevation of scapula.      Shoulder Exercises: ROM/Strengthening   UBE (Upper Arm Bike) L3: 1.5 min forward, 1.5 min backward.      Shoulder Exercises: Stretch   Corner Stretch 2 reps;20 seconds   2 positions   Internal Rotation Stretch 3 reps   15 sec   Other Shoulder Stretches Rt/Lt bicep stretch x 20 sec x 2 reps                      PT Short Term Goals - 11/22/20 1059       PT SHORT TERM GOAL #1   Title  Instruct patient in AROM Rt forearm, wrist, hand    Time 4    Period Weeks    Status Achieved      PT SHORT TERM GOAL #2   Title begin Rt shoulder rehab as directed my Dr Griffin Basil    Time 6    Period Weeks    Status Achieved      PT SHORT TERM GOAL #3   Title Independent in postural correction    Time 6    Period Weeks    Status On-going      PT SHORT TERM GOAL #4   Title Independent in self dressing and grooming    Time 6    Period Weeks    Status Achieved               PT Long Term Goals - 01/08/21 1039       PT LONG TERM GOAL #1   Title independent with initial HEP    Time 6    Period Weeks    Status Achieved      PT LONG TERM GOAL #2   Title demo Lt shoulder strength =/> 4/5 to allow initial return to activities at work and in his yard    Time 12    Period Weeks    Status On-going      PT LONG TERM GOAL #3   Title Rt shoulder ROM WFL's and painfree    Time 12    Period Weeks    Status Partially Met      PT LONG TERM GOAL #4   Title patient reports ability to rest and sleep with minimal to no pain in Rt shoulder    Time 12    Period Weeks    Status Achieved      PT LONG TERM GOAL #5   Title Independent in all strengthening exercises and activities prior to d/c    Time 12    Period Weeks    Status On-going                   Plan - 01/15/21 1030     Clinical Impression Statement Pt fatigued quickly with exercise moving RUE above shoulder height (without wt- rings on arch). Unable to tolerate standing D2 flexion with RUE with light resistance; improved tolerance in supine.  Overall, progressing well with strengthening of RUE.  Moves to next phase of rehab (18 wks, phase IV) on 9/19. Progressing well towards remaining goals.    Rehab Potential Good    PT Frequency 2x / week    PT Duration 12 weeks    PT Treatment/Interventions Iontophoresis 44m/ml Dexamethasone;Taping;Vasopneumatic Device;Patient/family education;Moist Heat;Therapeutic  activities;Passive range of motion;Dry needling;Therapeutic exercise;Cryotherapy;Electrical Stimulation;Neuromuscular re-education;Manual techniques    PT Next Visit Plan continue to gradually progress strengthening per protocol (wt limit to 5#). MMT shoulders (goal #2)   PT Home Exercise Plan [id number] - patient will refer to previous exercises does not want reprinted exercises    Consulted and Agree with Plan of Care Patient             Patient will benefit from skilled therapeutic intervention in order to improve the following deficits and impairments:  Decreased range of motion, Impaired UE functional use, Increased muscle spasms, Pain, Postural dysfunction, Decreased strength  Visit Diagnosis: Stiffness of right shoulder joint  Muscle weakness (generalized)  Chronic right shoulder pain     Problem List Patient Active Problem List   Diagnosis Date Noted   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 04/04/2020   Primary osteoarthritis involving multiple joints 07/26/2018   Acute left ankle pain 07/26/2018   Cerumen debris on tympanic membrane of right ear 07/26/2018   Tinea pedis 07/21/2018   Periscapular pain 07/20/2018   Retrocalcaneal bursitis, left 07/20/2018   Hepatitis B core antibody positive 07/20/2018   Polyarthralgia 07/20/2018   At risk for obstructive sleep apnea 07/20/2018   Hearing loss due to cerumen impaction, right 11/02/2017   Seasonal allergic rhinitis due to pollen 11/02/2017   Fasting hyperglycemia 08/06/2017   Primary osteoarthritis of right hip 08/06/2017   Chronic right-sided low back pain without sciatica 08/06/2017   Hypertriglyceridemia 08/06/2017   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   Gastroesophageal reflux disease without esophagitis 07/09/2017   Hypertension goal BP (blood pressure) < 130/80 07/09/2017   Class 2 obesity due to excess calories in adult 07/09/2017    JKerin Perna PTA 01/15/21 11:16 AM   [place] [address]Phone: [phone]  Fax:  [phone] Name: Adrian KILBURGMRN: [id number] of Birth: 9December 01, 1962

## 2021-01-17 ENCOUNTER — Ambulatory Visit: Payer: BC Managed Care – PPO | Admitting: Rehabilitative and Restorative Service Providers"

## 2021-01-17 ENCOUNTER — Encounter: Payer: Self-pay | Admitting: Rehabilitative and Restorative Service Providers"

## 2021-01-17 ENCOUNTER — Other Ambulatory Visit: Payer: Self-pay

## 2021-01-17 DIAGNOSIS — G8929 Other chronic pain: Secondary | ICD-10-CM

## 2021-01-17 DIAGNOSIS — M25511 Pain in right shoulder: Secondary | ICD-10-CM | POA: Diagnosis not present

## 2021-01-17 DIAGNOSIS — R600 Localized edema: Secondary | ICD-10-CM | POA: Diagnosis not present

## 2021-01-17 DIAGNOSIS — M6281 Muscle weakness (generalized): Secondary | ICD-10-CM

## 2021-01-17 DIAGNOSIS — M25611 Stiffness of right shoulder, not elsewhere classified: Secondary | ICD-10-CM | POA: Diagnosis not present

## 2021-01-17 NOTE — Therapy (Signed)
[place], [place], [address] Phone: [phone]   Fax:  [phone]  Physical Therapy Treatment  Patient Details  Name: Adrian [name] MRN: [id number] Date of Birth: Jun 22, 1960 Referring Provider (PT): Dr Ophelia Charter   Encounter Date: 01/17/2021   PT End of Session - 01/17/21 1026     Visit Number 18    Number of Visits 24    Date for PT Re-Evaluation 02/14/21    PT Start Time 1410    PT Stop Time 1100    PT Time Calculation (min) 45 min    Activity Tolerance Patient tolerated treatment well             Past Medical History:  Diagnosis Date   Chronic right hip pain 08/06/2017   Chronic right-sided low back pain without sciatica 08/06/2017   GERD (gastroesophageal reflux disease)    Hypertension    Hypertriglyceridemia 08/06/2017   Seasonal allergies     Past Surgical History:  Procedure Laterality Date   ESOPHAGOGASTRODUODENOSCOPY     NO PAST SURGERIES     SHOULDER ARTHROSCOPY WITH SUBACROMIAL DECOMPRESSION AND BICEP TENDON REPAIR Left 05/31/2020   Procedure: LEFT SHOULDER ARTHROSCOPY WITH DEBRIDEMENT, DISTAL CLAVICAL EXCISION,  SUBACROMIAL DECOMPRESSION, BICEPS TENODESIS;  Surgeon: Hiram Gash, MD;  Location: [facility];  Service: Orthopedics;  Laterality: Left;    There were no vitals filed for this visit.   Subjective Assessment - 01/17/21 1026     Subjective Feels weakness with overhead lifting when putting a pot on a shelf.    Diagnostic tests -    Patient Stated Goals use the Rt UE as normal.  HIking , build things, and shoot a gun and landscaping    Currently in Pain? No/denies    Pain Score 0-No pain    Pain Location Shoulder    Pain Orientation Right                               [facility] Adult PT Treatment/Exercise - 01/17/21 0001       Lumbar Exercises: Quadruped   Other Quadruped Lumbar Exercises plank on elevated table with arm  reaches down, side, and forward x 5 each arm      Shoulder Exercises: Standing   Protraction Limitations swinging 5# KB knees to chest level x 20 reps    Flexion Limitations 2# wt suspended from TB to bamboo stick each side(4 # total wt)- overhead press, 2 sets of 15.    Other Standing Exercises biceps curls x 10 reps 2# - each side of bamboo stick, x 2 sets    Other Standing Exercises standing rolling large foam roll up wall - serratus x ~ 1 min x 2 reps      Shoulder Exercises: Therapy Ball   Other Therapy Ball Exercises ER rolling coregous ball against dorsum of hand, small circles x 1 min x 2 reps - UE at ~ waist height and shoulder height      Shoulder Exercises: ROM/Strengthening   UBE (Upper Arm Bike) L5: 1.5 min forward, 1.5 min backward.    Wall Pushups 10 reps    Wall Pushups Limitations reverse wall push ups    Pushups 10 reps    Pushups Limitations wall push up standing    Other ROM/Strengthening Exercises bent row x 2# wt x 2 on bamboo stick x 10 x 2 sets Rt/Lt  Other ROM/Strengthening Exercises overhead press 5#KB x 5 reps x 2 sets each side      Shoulder Exercises: Stretch   Corner Stretch 3 reps;10 seconds   15 sec hold focus on Rt then Lt shoulder stretch   Internal Rotation Stretch 3 reps   15 sec   Other Shoulder Stretches Rt/Lt bicep stretch x 20 sec x 2 reps                      PT Short Term Goals - 11/22/20 1059       PT SHORT TERM GOAL #1   Title Instruct patient in AROM Rt forearm, wrist, hand    Time 4    Period Weeks    Status Achieved      PT SHORT TERM GOAL #2   Title begin Rt shoulder rehab as directed my Dr Griffin Basil    Time 6    Period Weeks    Status Achieved      PT SHORT TERM GOAL #3   Title Independent in postural correction    Time 6    Period Weeks    Status On-going      PT SHORT TERM GOAL #4   Title Independent in self dressing and grooming    Time 6    Period Weeks    Status Achieved               PT Long  Term Goals - 01/08/21 1039       PT LONG TERM GOAL #1   Title independent with initial HEP    Time 6    Period Weeks    Status Achieved      PT LONG TERM GOAL #2   Title demo Lt shoulder strength =/> 4/5 to allow initial return to activities at work and in his yard    Time 12    Period Weeks    Status On-going      PT LONG TERM GOAL #3   Title Rt shoulder ROM WFL's and painfree    Time 12    Period Weeks    Status Partially Met      PT LONG TERM GOAL #4   Title patient reports ability to rest and sleep with minimal to no pain in Rt shoulder    Time 12    Period Weeks    Status Achieved      PT LONG TERM GOAL #5   Title Independent in all strengthening exercises and activities prior to d/c    Time 12    Period Weeks    Status On-going                   Plan - 01/17/21 1028     Clinical Impression Statement Continues to work on UE strengthening with various exercises and activities, progressing weights and reps    Rehab Potential Good    PT Frequency 2x / week    PT Duration 12 weeks    PT Treatment/Interventions Iontophoresis 70m/ml Dexamethasone;Taping;Vasopneumatic Device;Patient/family education;Moist Heat;Therapeutic activities;Passive range of motion;Dry needling;Therapeutic exercise;Cryotherapy;Electrical Stimulation;Neuromuscular re-education;Manual techniques    PT Next Visit Plan continue to gradually progress strengthening per protocol (wt limit to 5#); progresses to Phase IV at 18 weeks on 02/11/21    PT Home Exercise Plan [id number] - patient will refer to previous exercises does not want reprinted exercises    Consulted and Agree with Plan of Care Patient  Patient will benefit from skilled therapeutic intervention in order to improve the following deficits and impairments:     Visit Diagnosis: Stiffness of right shoulder joint  Muscle weakness (generalized)  Chronic right shoulder pain  Localized edema     Problem  List Patient Active Problem List   Diagnosis Date Noted   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 04/04/2020   Primary osteoarthritis involving multiple joints 07/26/2018   Acute left ankle pain 07/26/2018   Cerumen debris on tympanic membrane of right ear 07/26/2018   Tinea pedis 07/21/2018   Periscapular pain 07/20/2018   Retrocalcaneal bursitis, left 07/20/2018   Hepatitis B core antibody positive 07/20/2018   Polyarthralgia 07/20/2018   At risk for obstructive sleep apnea 07/20/2018   Hearing loss due to cerumen impaction, right 11/02/2017   Seasonal allergic rhinitis due to pollen 11/02/2017   Fasting hyperglycemia 08/06/2017   Primary osteoarthritis of right hip 08/06/2017   Chronic right-sided low back pain without sciatica 08/06/2017   Hypertriglyceridemia 08/06/2017   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   Gastroesophageal reflux disease without esophagitis 07/09/2017   Hypertension goal BP (blood pressure) < 130/80 07/09/2017   Class 2 obesity due to excess calories in adult 07/09/2017    [place] PT, MPH  01/17/2021, 10:57 AM  [facility] [place] [address] [place], [place], [address] Phone: [phone]   Fax:  [phone]  Name: Adrian [name] MRN: [id number] Date of Birth: 12-22-1960

## 2021-01-22 ENCOUNTER — Other Ambulatory Visit: Payer: Self-pay

## 2021-01-22 ENCOUNTER — Ambulatory Visit: Payer: BC Managed Care – PPO | Admitting: Physical Therapy

## 2021-01-22 DIAGNOSIS — M25511 Pain in right shoulder: Secondary | ICD-10-CM | POA: Diagnosis not present

## 2021-01-22 DIAGNOSIS — M6281 Muscle weakness (generalized): Secondary | ICD-10-CM

## 2021-01-22 DIAGNOSIS — G8929 Other chronic pain: Secondary | ICD-10-CM

## 2021-01-22 DIAGNOSIS — M25611 Stiffness of right shoulder, not elsewhere classified: Secondary | ICD-10-CM | POA: Diagnosis not present

## 2021-01-22 NOTE — Therapy (Signed)
Adrian [name] [place], [place], [address] Phone: [phone]   Fax:  [phone]  Physical Therapy Treatment  Patient Details  Name: Adrian [name] MRN: [id number] Date of Birth: [birth year] Referring Provider (PT): Dr Ophelia Charter   Encounter Date: 01/22/2021   PT End of Session - 01/22/21 1023     Visit Number 19    Number of Visits 24    Date for PT Re-Evaluation 02/14/21    Authorization Type BCBS    PT Start Time 1018    PT Stop Time 1100    PT Time Calculation (min) 42 min    Activity Tolerance Patient tolerated treatment well    Behavior During Therapy [facility] for tasks assessed/performed             Past Medical History:  Diagnosis Date   Chronic right hip pain 08/06/2017   Chronic right-sided low back pain without sciatica 08/06/2017   GERD (gastroesophageal reflux disease)    Hypertension    Hypertriglyceridemia 08/06/2017   Seasonal allergies     Past Surgical History:  Procedure Laterality Date   ESOPHAGOGASTRODUODENOSCOPY     NO PAST SURGERIES     SHOULDER ARTHROSCOPY WITH SUBACROMIAL DECOMPRESSION AND BICEP TENDON REPAIR Left 05/31/2020   Procedure: LEFT SHOULDER ARTHROSCOPY WITH DEBRIDEMENT, DISTAL CLAVICAL EXCISION,  SUBACROMIAL DECOMPRESSION, BICEPS TENODESIS;  Surgeon: Hiram Gash, MD;  Location: [facility];  Service: Orthopedics;  Laterality: Left;    There were no vitals filed for this visit.   Subjective Assessment - 01/22/21 1024     Subjective Pt reports that he cleared some dead wood from his property all weekend.  Overall did ok with it.    Currently in Pain? No/denies    Pain Score 0-No pain                [facility] PT Assessment - 01/22/21 0001       Assessment   Medical Diagnosis Rt/Lt RTC repair with bicep tendodesis and DCR    Referring Provider (PT) Dr Ophelia Charter    Onset Date/Surgical Date 10/08/20    Hand Dominance Right    Next MD Visit PRN     Prior Therapy here for Lt shoulder      Precautions   Precaution Comments per protocol      Strength   Strength Assessment Site Shoulder    Right/Left Shoulder Right    Right Shoulder Flexion 4-/5    Right Shoulder Extension 5/5    Right Shoulder ABduction 3+/5    Right Shoulder Internal Rotation 4+/5    Right Shoulder External Rotation 5/5    Left Shoulder Flexion 4+/5    Left Shoulder Extension 5/5    Left Shoulder ABduction 4+/5    Left Shoulder Internal Rotation 5/5    Left Shoulder External Rotation --   5-/5             [facility] Adult PT Treatment/Exercise - 01/22/21 0001       Shoulder Exercises: Standing   Protraction Limitations --    ABduction Right;10 reps    Shoulder ABduction Weight (lbs) 1    ABduction Limitations to shoulder height.    Row Limitations bent row with triceps kick back 5# x 12 reps each arm    Other Standing Exercises arms abdct ~70 deg with shoulder circles x 10 CW/ 10 CCW    Other Standing Exercises rings on arch (12) - moving Lt/ Rt, LUE  with 2# on wrist, RUE without wt for first set, 1# for 2nd set. cues to avoid elevation of scapula.  bicep curl to overhead press with 2# x 10      Shoulder Exercises: Pulleys   Flexion 2 minutes    ABduction 2 minutes      Shoulder Exercises: Stretch   Corner Stretch 3 reps;20 seconds    Internal Rotation Stretch 3 reps   15 sec   Other Shoulder Stretches Rt/Lt bicep stretch x 20 sec x 2 reps    Other Shoulder Stretches Rt /Lt tricep stretch x 20 sec each                      PT Short Term Goals - 11/22/20 1059       PT SHORT TERM GOAL #1   Title Instruct patient in AROM Rt forearm, wrist, hand    Time 4    Period Weeks    Status Achieved      PT SHORT TERM GOAL #2   Title begin Rt shoulder rehab as directed my Dr Griffin Basil    Time 6    Period Weeks    Status Achieved      PT SHORT TERM GOAL #3   Title Independent in postural correction    Time 6    Period Weeks    Status  On-going      PT SHORT TERM GOAL #4   Title Independent in self dressing and grooming    Time 6    Period Weeks    Status Achieved               PT Long Term Goals - 01/08/21 1039       PT LONG TERM GOAL #1   Title independent with initial HEP    Time 6    Period Weeks    Status Achieved      PT LONG TERM GOAL #2   Title demo Lt shoulder strength =/> 4/5 to allow initial return to activities at work and in his yard    Time 12    Period Weeks    Status On-going      PT LONG TERM GOAL #3   Title Rt shoulder ROM WFL's and painfree    Time 12    Period Weeks    Status Partially Met      PT LONG TERM GOAL #4   Title patient reports ability to rest and sleep with minimal to no pain in Rt shoulder    Time 12    Period Weeks    Status Achieved      PT LONG TERM GOAL #5   Title Independent in all strengthening exercises and activities prior to d/c    Time 12    Period Weeks    Status On-going                   Plan - 01/22/21 1058     Clinical Impression Statement Continued progress with RUE strengthening.  Working through [employer] for overhead work and stretches for [employer] and [employer].  Pt making great progress towards remaining goals. Pt requests to decrease frequency until next phase of protocol (18 wks post-op).  Will discuss d/c planning over next few visits.    Rehab Potential Good    PT Frequency 2x / week    PT Duration 12 weeks    PT Treatment/Interventions Iontophoresis 15m/ml Dexamethasone;Taping;Vasopneumatic Device;Patient/family education;Moist Heat;Therapeutic activities;Passive  range of motion;Dry needling;Therapeutic exercise;Cryotherapy;Electrical Stimulation;Neuromuscular re-education;Manual techniques    PT Next Visit Plan continue to gradually progress strengthening per protocol (wt limit to 5#); progresses to Phase IV at 18 weeks on 02/11/21    PT Home Exercise Plan [id number] - patient will refer to previous exercises does not want  reprinted exercises    Consulted and Agree with Plan of Care Patient             Patient will benefit from skilled therapeutic intervention in order to improve the following deficits and impairments:  Decreased range of motion, Impaired UE functional use, Increased muscle spasms, Pain, Postural dysfunction, Decreased strength  Visit Diagnosis: Stiffness of right shoulder joint  Muscle weakness (generalized)  Chronic right shoulder pain     Problem List Patient Active Problem List   Diagnosis Date Noted   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 04/04/2020   Primary osteoarthritis involving multiple joints 07/26/2018   Acute left ankle pain 07/26/2018   Cerumen debris on tympanic membrane of right ear 07/26/2018   Tinea pedis 07/21/2018   Periscapular pain 07/20/2018   Retrocalcaneal bursitis, left 07/20/2018   Hepatitis B core antibody positive 07/20/2018   Polyarthralgia 07/20/2018   At risk for obstructive sleep apnea 07/20/2018   Hearing loss due to cerumen impaction, right 11/02/2017   Seasonal allergic rhinitis due to pollen 11/02/2017   Fasting hyperglycemia 08/06/2017   Primary osteoarthritis of right hip 08/06/2017   Chronic right-sided low back pain without sciatica 08/06/2017   Hypertriglyceridemia 08/06/2017   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   Gastroesophageal reflux disease without esophagitis 07/09/2017   Hypertension goal BP (blood pressure) < 130/80 07/09/2017   Class 2 obesity due to excess calories in adult 07/09/2017   Kerin Perna, PTA 01/22/21 11:07 AM  [facility] [address] [place], [place], [address] Phone: [phone]   Fax:  [phone]  Name: SERIGNE KUBICEK MRN: [id number] Date of Birth: 1961-04-14

## 2021-01-24 ENCOUNTER — Encounter: Payer: BC Managed Care – PPO | Admitting: Physical Therapy

## 2021-01-29 ENCOUNTER — Encounter: Payer: BC Managed Care – PPO | Admitting: Rehabilitative and Restorative Service Providers"

## 2021-01-31 ENCOUNTER — Encounter: Payer: BC Managed Care – PPO | Admitting: Rehabilitative and Restorative Service Providers"

## 2021-02-03 ENCOUNTER — Emergency Department (INDEPENDENT_AMBULATORY_CARE_PROVIDER_SITE_OTHER): Payer: BC Managed Care – PPO

## 2021-02-03 ENCOUNTER — Emergency Department
Admission: EM | Admit: 2021-02-03 | Discharge: 2021-02-03 | Disposition: A | Discharge status: Home / Self Care | Payer: BC Managed Care – PPO | Source: Home / Self Care

## 2021-02-03 ENCOUNTER — Other Ambulatory Visit: Payer: Self-pay

## 2021-02-03 ENCOUNTER — Encounter: Payer: Self-pay | Admitting: Emergency Medicine

## 2021-02-03 DIAGNOSIS — R0781 Pleurodynia: Secondary | ICD-10-CM

## 2021-02-03 DIAGNOSIS — W19XXXA Unspecified fall, initial encounter: Secondary | ICD-10-CM | POA: Diagnosis not present

## 2021-02-03 DIAGNOSIS — M25512 Pain in left shoulder: Secondary | ICD-10-CM | POA: Diagnosis not present

## 2021-02-03 DIAGNOSIS — S20212A Contusion of left front wall of thorax, initial encounter: Secondary | ICD-10-CM

## 2021-02-03 MED ORDER — IBUPROFEN 800 MG PO TABS: 800.0000 mg | ORAL_TABLET | Freq: Three times a day (TID) | ORAL | 0 refills | Status: DC

## 2021-02-03 NOTE — ED Triage Notes (Signed)
Patient presents to Urgent Care with complaints of chest/rib pain since 1 week ago. Patient reports fell off a bank while cutting down a tree. He threw the chainsaw, landed on left side of chest, left knee. Having pain of the left chest around to the back. Does have pain with taking a deep breath. Sneezing, coughing, reaching, and bending causes extreme pain. Tried Tylenol PM for sleep and Celebrex. Took Hydrocodone from previous shoulder surgery with little pain.

## 2021-02-03 NOTE — ED Provider Notes (Signed)
[name]    CSN: [id number] Arrival date & time: 02/03/21  1413      History   Chief Complaint Chief Complaint  Patient presents with   Chest Pain    Rib pain    HPI Adrian [name] is a 60 y.o. male.   HPI 59 year old male presents with left-sided rib pain and secondary to fall 1 week ago.  Patient reports pain with deep breathing.  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 04/04/2020   Primary osteoarthritis involving multiple joints 07/26/2018   Acute left ankle pain 07/26/2018   Cerumen debris on tympanic membrane of right ear 07/26/2018   Tinea pedis 07/21/2018   Periscapular pain 07/20/2018   Retrocalcaneal bursitis, left 07/20/2018   Hepatitis B core antibody positive 07/20/2018   Polyarthralgia 07/20/2018   At risk for obstructive sleep apnea 07/20/2018   Hearing loss due to cerumen impaction, right 11/02/2017   Seasonal allergic rhinitis due to pollen 11/02/2017   Fasting hyperglycemia 08/06/2017   Primary osteoarthritis of right hip 08/06/2017   Chronic right-sided low back pain without sciatica 08/06/2017   Hypertriglyceridemia 08/06/2017   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   Gastroesophageal reflux disease without esophagitis 07/09/2017   Hypertension goal BP (blood pressure) < 130/80 07/09/2017   Class 2 obesity due to excess calories in adult 07/09/2017    Past Surgical History:  Procedure Laterality Date   ESOPHAGOGASTRODUODENOSCOPY     NO PAST SURGERIES     SHOULDER ARTHROSCOPY WITH SUBACROMIAL DECOMPRESSION AND BICEP TENDON REPAIR Left 05/31/2020   Procedure: LEFT SHOULDER ARTHROSCOPY WITH DEBRIDEMENT, DISTAL CLAVICAL  EXCISION,  SUBACROMIAL DECOMPRESSION, BICEPS TENODESIS;  Surgeon: [name], MD;  Location: [facility];  Service: Orthopedics;  Laterality: Left;       Home Medications    Prior to Admission medications   Medication Sig Start Date End Date Taking? Authorizing Provider  aspirin EC 81 MG tablet Take 1 tablet (81 mg total) by mouth daily. 07/09/17  Yes Carlis Stable, PA-C  celecoxib (CELEBREX) 200 MG capsule TAKE 1 CAPSULE BY MOUTH DAILY. MAY TAKE EXTRA CAPSULE AS NEEDED FOR MODERATE OR SEVERE PAIN 01/03/21  Yes Sunnie Nielsen, DO  famotidine (PEPCID) 20 MG tablet Take 1-2 tablets (20-40 mg total) by mouth daily. 10/02/20  Yes Sunnie Nielsen, DO  hydrochlorothiazide (HYDRODIURIL) 12.5 MG tablet Take 1 tablet (12.5 mg total) by mouth daily. 10/02/20  Yes Sunnie Nielsen, DO  ibuprofen (ADVIL) 800 MG tablet Take 1 tablet (800 mg total) by mouth 3 (three) times daily. 02/03/21  Yes Trevor Iha, FNP  fluticasone (FLONASE) 50 MCG/ACT nasal spray Place 1 spray into both nostrils daily. 10/02/20   Sunnie Nielsen, DO  loratadine (CLARITIN) 10 MG tablet Take 1 tablet (10 mg total) by mouth daily. 10/02/20   Sunnie Nielsen, DO    Family History Family History  Problem Relation Age of Onset   Dementia Mother    Stroke Father    COPD Father    Heart failure Father    Asthma Father    Asthma Sister    Heart attack Paternal Uncle    Stroke Paternal Uncle  Social History Social History   Tobacco Use   Smoking status: Never   Smokeless tobacco: Never  Vaping Use   Vaping Use: Never used  Substance Use Topics   Alcohol use: No   Drug use: No     Allergies   Patient has no known allergies.   Review of Systems Review of Systems  Musculoskeletal:        Left-sided rib cage tenderness x 1 week  All other systems reviewed and are negative.   Physical Exam Triage Vital Signs ED Triage Vitals  Enc Vitals Group     BP 02/03/21 1506  (!) 149/79     Pulse Rate 02/03/21 1506 61     Resp 02/03/21 1506 16     Temp 02/03/21 1506 99 F (37.2 C)     Temp Source 02/03/21 1506 Oral     SpO2 02/03/21 1506 97 %     Weight --      Height --      Head Circumference --      Peak Flow --      Pain Score 02/03/21 1504 4     Pain Loc --      Pain Edu? --      Excl. in GC? --    No data found.  Updated Vital Signs BP (!) 149/79 (BP Location: Right Arm)   Pulse 61   Temp 99 F (37.2 C) (Oral)   Resp 16   SpO2 97%       Physical Exam Vitals and nursing note reviewed.  Constitutional:      Appearance: Normal appearance. He is normal weight.  HENT:     Head: Normocephalic and atraumatic.     Mouth/Throat:     Mouth: Mucous membranes are moist.     Pharynx: Oropharynx is clear.  Eyes:     Extraocular Movements: Extraocular movements intact.     Conjunctiva/sclera: Conjunctivae normal.     Pupils: Pupils are equal, round, and reactive to light.  Cardiovascular:     Rate and Rhythm: Normal rate and regular rhythm.     Pulses: Normal pulses.     Heart sounds: Normal heart sounds. No murmur heard. Pulmonary:     Effort: Pulmonary effort is normal.     Breath sounds: Normal breath sounds. No wheezing, rhonchi or rales.  Musculoskeletal:        General: Tenderness present. Normal range of motion.     Cervical back: Normal range of motion and neck supple.     Comments: Rib cage (left-sided superior lateral aspect): TTP over lateral aspect over fourth through sixth ribs, no deformity noted  Skin:    General: Skin is warm and dry.  Neurological:     General: No focal deficit present.     Mental Status: He is alert and oriented to person, place, and time. Mental status is at baseline.  Psychiatric:        Mood and Affect: Mood normal.        Behavior: Behavior normal.        Thought Content: Thought content normal.     UC Treatments / Results  Labs (all labs ordered are listed, but only abnormal results are  displayed) Labs Reviewed - No data to display  EKG   Radiology DG Ribs Unilateral W/Chest Left  Result Date: 02/03/2021 CLINICAL DATA:  Left anterior rib pain after fall, left shoulder pain EXAM: LEFT RIBS AND CHEST - 3+ VIEW COMPARISON:  08/08/2019 FINDINGS:  Frontal view of the chest as well as frontal and oblique views of the left thoracic cage are obtained. Cardiac silhouette is unremarkable. No airspace disease, effusion, or pneumothorax. There are no acute displaced fractures. IMPRESSION: 1. No acute intrathoracic process. Electronically Signed   By: Sharlet Salina M.D.   On: 02/03/2021 15:50    Procedures Procedures (including critical care time)  Medications Ordered in UC Medications - No data to display  Initial Impression / Assessment and Plan / UC Course  I have reviewed the triage vital signs and the nursing notes.  Pertinent labs & imaging results that were available during my care of the patient were reviewed by me and considered in my medical decision making (see chart for details).     MDM: 1.  Fall, initial encounter-1 week ago; 2.  Rib contusion on left side-CXR was negative for acute fracture and/or intrathoracic process, Rx'd Ibuprofen. Advised/instructed patient to take medication with food.  Encouraged patient to increase daily water intake while taking these medications.  Patient discharged home, hemodynamically stable. Final Clinical Impressions(s) / UC Diagnoses   Final diagnoses:  Fall, initial encounter  Rib pain on left side  Rib contusion, left, initial encounter     Discharge Instructions      Advised/instructed patient to take medication with food.  Encouraged patient to increase daily water intake while taking these medications.     ED Prescriptions     Medication Sig Dispense Auth. Provider   ibuprofen (ADVIL) 800 MG tablet Take 1 tablet (800 mg total) by mouth 3 (three) times daily. 30 tablet Trevor Iha, FNP      PDMP not reviewed  this encounter.   Trevor Iha, FNP 02/03/21 1657

## 2021-02-03 NOTE — Discharge Instructions (Addendum)
Advised/instructed patient to take medication with food.  Encouraged patient to increase daily water intake while taking these medications.

## 2021-02-05 ENCOUNTER — Ambulatory Visit: Payer: BC Managed Care – PPO | Admitting: Physical Therapy

## 2021-02-05 ENCOUNTER — Encounter: Payer: Self-pay | Admitting: Physical Therapy

## 2021-02-05 ENCOUNTER — Other Ambulatory Visit: Payer: Self-pay

## 2021-02-05 DIAGNOSIS — M25611 Stiffness of right shoulder, not elsewhere classified: Secondary | ICD-10-CM

## 2021-02-05 DIAGNOSIS — M6281 Muscle weakness (generalized): Secondary | ICD-10-CM

## 2021-02-05 DIAGNOSIS — G8929 Other chronic pain: Secondary | ICD-10-CM | POA: Diagnosis not present

## 2021-02-05 DIAGNOSIS — M25511 Pain in right shoulder: Secondary | ICD-10-CM | POA: Diagnosis not present

## 2021-02-05 NOTE — Therapy (Signed)
[facility] [place], [place], [address] Phone: [phone]   Fax:  [phone]  Physical Therapy Treatment  Patient Details  Name: Adrian [name] MRN: [id number] Date of Birth: [birth year] Referring Provider (PT): Dr Ophelia Charter   Encounter Date: 02/05/2021   PT End of Session - 02/05/21 1027     Visit Number 20    Number of Visits 24    Date for PT Re-Evaluation 02/14/21    Authorization Type BCBS    PT Start Time 1018    PT Stop Time 1103    PT Time Calculation (min) 45 min    Activity Tolerance Patient tolerated treatment well    Behavior During Therapy [facility] for tasks assessed/performed             Past Medical History:  Diagnosis Date   Chronic right hip pain 08/06/2017   Chronic right-sided low back pain without sciatica 08/06/2017   GERD (gastroesophageal reflux disease)    Hypertension    Hypertriglyceridemia 08/06/2017   Seasonal allergies     Past Surgical History:  Procedure Laterality Date   ESOPHAGOGASTRODUODENOSCOPY     NO PAST SURGERIES     SHOULDER ARTHROSCOPY WITH SUBACROMIAL DECOMPRESSION AND BICEP TENDON REPAIR Left 05/31/2020   Procedure: LEFT SHOULDER ARTHROSCOPY WITH DEBRIDEMENT, DISTAL CLAVICAL EXCISION,  SUBACROMIAL DECOMPRESSION, BICEPS TENODESIS;  Surgeon: Hiram Gash, MD;  Location: [facility];  Service: Orthopedics;  Laterality: Left;    There were no vitals filed for this visit.   Subjective Assessment - 02/05/21 1028     Subjective Pt reports that he slipped and fell 1 wk ago, and landed on his Lt ribs.  He has noticed more popping and cracking in both of his shoulders with donning/doffing shirts.  He has been trying to take it easy with arms, due to rib pain.    Patient Stated Goals use the Rt UE as normal.  HIking , build things, and shoot a gun and landscaping    Currently in Pain? Yes    Pain Score 1     Pain Location Rib cage    Pain Orientation  Left;Lateral                [facility] PT Assessment - 02/05/21 0001       Assessment   Medical Diagnosis Rt/Lt RTC repair with bicep tendodesis and DCR    Referring Provider (PT) Dr Ophelia Charter    Onset Date/Surgical Date 10/08/20    Hand Dominance Right    Next MD Visit PRN    Prior Therapy here for Lt shoulder      Strength   Right/Left Shoulder Right    Right Shoulder Flexion 4-/5    Right Shoulder ABduction 4-/5              [facility] Adult PT Treatment/Exercise - 02/05/21 0001       Shoulder Exercises: Standing   Flexion Strengthening;Right;Left;10 reps    Shoulder Flexion Weight (lbs) 2    Flexion Limitations to shoulder height    ABduction Strengthening;Right;Left;10 reps    Shoulder ABduction Weight (lbs) 2    ABduction Limitations to shoulder height.    Row Limitations bent row with triceps kick back 5# x 15 reps bilat    Other Standing Exercises Rt shoulder lifting 3# to overhead shelf with RUE x 10 reps (to ~115)    Other Standing Exercises rings on arch (12) - moving Lt/ Rt, ,  RUE without wt for 6,  2# for 2nd set of 12 rings. cues to avoid elevation of scapula.  bicep curl to overhead press with 3# x 10      Shoulder Exercises: Pulleys   Flexion 3 minutes      Shoulder Exercises: ROM/Strengthening   UBE (Upper Arm Bike) L5: 1.5 min forward, 1.5 min backward. standing    Wall Pushups Limitations reverse wall push ups x 10    Pushups Limitations counter push ups x 10      Shoulder Exercises: Stretch   Internal Rotation Stretch 3 reps   15 sec   Table Stretch - Flexion 1 rep;10 seconds   after counter push ups.   Other Shoulder Stretches Rt/Lt bicep stretch x 20 sec x 2 reps.  Rt/Lt pec stretch at wall x 3 reps of 15 sec      Shoulder Exercises: Body Blade   Flexion 30 seconds   each arm, to 90 deg                      PT Short Term Goals - 11/22/20 1059       PT SHORT TERM GOAL #1   Title Instruct patient in AROM Rt forearm, wrist,  hand    Time 4    Period Weeks    Status Achieved      PT SHORT TERM GOAL #2   Title begin Rt shoulder rehab as directed my Dr Griffin Basil    Time 6    Period Weeks    Status Achieved      PT SHORT TERM GOAL #3   Title Independent in postural correction    Time 6    Period Weeks    Status On-going      PT SHORT TERM GOAL #4   Title Independent in self dressing and grooming    Time 6    Period Weeks    Status Achieved               PT Long Term Goals - 02/05/21 1118       PT LONG TERM GOAL #1   Title independent with initial HEP    Time 6    Period Weeks    Status Achieved      PT LONG TERM GOAL #2   Title demo Rt shoulder strength =/> 4/5 to allow initial return to activities at work and in his yard    Time 12    Period Weeks    Status Partially Met      PT LONG TERM GOAL #3   Title Rt shoulder ROM WFL's and painfree    Time 12    Period Weeks    Status Partially Met      PT LONG TERM GOAL #4   Title patient reports ability to rest and sleep with minimal to no pain in Rt shoulder    Time 12    Period Weeks    Status Achieved      PT LONG TERM GOAL #5   Title Independent in all strengthening exercises and activities prior to d/c    Time 12    Period Weeks    Status On-going                   Plan - 02/05/21 1115     Clinical Impression Statement Despite reported soreness in Lt ribs, pt tolerated UE strengthening well, with report of shoulder fatigue but no  pain.  He was able to increase resistance with exercises of Rt shoulder in multiple directions.  Pt's Rt shoulder strength gradually improving. Pt is making good progress towards remaining unmet goals.    Rehab Potential Good    PT Frequency 2x / week    PT Duration 12 weeks    PT Treatment/Interventions Iontophoresis 73m/ml Dexamethasone;Taping;Vasopneumatic Device;Patient/family education;Moist Heat;Therapeutic activities;Passive range of motion;Dry needling;Therapeutic  exercise;Cryotherapy;Electrical Stimulation;Neuromuscular re-education;Manual techniques    PT Next Visit Plan progresses to Phase IV at 18 weeks on 02/11/21. assess readiness to d/c vs additional visits (end of POC).    PT Home Exercise Plan [id number] - patient will refer to previous exercises does not want reprinted exercises    Consulted and Agree with Plan of Care Patient             Patient will benefit from skilled therapeutic intervention in order to improve the following deficits and impairments:  Decreased range of motion, Impaired UE functional use, Increased muscle spasms, Pain, Postural dysfunction, Decreased strength  Visit Diagnosis: Stiffness of right shoulder joint  Muscle weakness (generalized)  Chronic right shoulder pain     Problem List Patient Active Problem List   Diagnosis Date Noted   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 04/04/2020   Primary osteoarthritis involving multiple joints 07/26/2018   Acute left ankle pain 07/26/2018   Cerumen debris on tympanic membrane of right ear 07/26/2018   Tinea pedis 07/21/2018   Periscapular pain 07/20/2018   Retrocalcaneal bursitis, left 07/20/2018   Hepatitis B core antibody positive 07/20/2018   Polyarthralgia 07/20/2018   At risk for obstructive sleep apnea 07/20/2018   Hearing loss due to cerumen impaction, right 11/02/2017   Seasonal allergic rhinitis due to pollen 11/02/2017   Fasting hyperglycemia 08/06/2017   Primary osteoarthritis of right hip 08/06/2017   Chronic right-sided low back pain without sciatica 08/06/2017   Hypertriglyceridemia 08/06/2017   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   Gastroesophageal reflux disease without esophagitis 07/09/2017   Hypertension goal BP (blood pressure) < 130/80 07/09/2017   Class 2 obesity due to excess calories in adult 07/09/2017    JKerin Perna PTA 02/05/21 11:21 AM   [place] [address]Phone: [phone]  Fax:  [phone] Name: GTHADD APUZZOMRN: [id number] of Birth: [birth year]

## 2021-02-07 ENCOUNTER — Encounter: Payer: BC Managed Care – PPO | Admitting: Rehabilitative and Restorative Service Providers"

## 2021-02-12 ENCOUNTER — Ambulatory Visit: Payer: BC Managed Care – PPO | Admitting: Rehabilitative and Restorative Service Providers"

## 2021-02-12 ENCOUNTER — Encounter: Payer: Self-pay | Admitting: Rehabilitative and Restorative Service Providers"

## 2021-02-12 ENCOUNTER — Other Ambulatory Visit: Payer: Self-pay

## 2021-02-12 DIAGNOSIS — M25511 Pain in right shoulder: Secondary | ICD-10-CM | POA: Diagnosis not present

## 2021-02-12 DIAGNOSIS — G8929 Other chronic pain: Secondary | ICD-10-CM

## 2021-02-12 DIAGNOSIS — M25611 Stiffness of right shoulder, not elsewhere classified: Secondary | ICD-10-CM

## 2021-02-12 DIAGNOSIS — M6281 Muscle weakness (generalized): Secondary | ICD-10-CM | POA: Diagnosis not present

## 2021-02-12 NOTE — Patient Instructions (Addendum)
  Access Code: [email or web address] URL: [email or web address] Date: 02/12/2021 Prepared by: Corlis Leak  Exercises Flexion-Extension Shoulder Pendulum with Table Support - 1 x daily - 7 x weekly - 15 reps - 3 sets Seated Scapular Retraction - 3-4 x daily - 7 x weekly - 10 reps - 5 sec hold Standing 'L' Stretch at [employer] - 3-4 x daily - 10-15 reps Seated Shoulder External Rotation AAROM with Cane and Hand in Neutral - 2 x daily - 7 x weekly - 1 sets - 5-10 reps - 5-10 sec hold Seated Shoulder Flexion AAROM with Pulley Behind - 2 x daily - 7 x weekly - 1 sets - 10 reps - 10 sec hold Seated Shoulder Scaption AAROM with Pulley at Side - 2 x daily - 7 x weekly - 1 sets - 10 reps - 10sec hold Supine Shoulder Flexion Extension AAROM with Dowel - 2 x daily - 7 x weekly - 1 sets - 10 reps - 3-5sec hold Corner Pec Major Stretch - 1 x daily - 7 x weekly - 1 sets - 2-3 reps - 20 seconds hold Split Stance Shoulder Row with Resistance - 1 x daily - 3 x weekly - 2-3 sets - 10 reps Shoulder External Rotation with Anchored Resistance - 1 x daily - 3 x weekly - 2-3 sets - 10 reps Standing Shoulder Internal Rotation with Anchored Resistance - 1 x daily - 3 x weekly - 2-3 sets - 10 reps Shoulder Extension Reactive Isometrics with Elbow at 90 - 2 x daily - 7 x weekly - 1-2 sets - 10 reps - 2-3 sec hold Standing Eccentric Shoulder Flexion at Wall - 1 x daily - 7 x weekly - 1 sets - 5-10 reps - 3 sec hold Isometric Shoulder External Rotation in Abduction with Ball at Wall - 2 x daily - 7 x weekly - 1 sets - 5-10 reps - 30-60 sec hold Plank on Counter - 2 x daily - 7 x weekly - 1 sets - 3 reps - 30 sec hold Plank with Shoulder Flexion on Counter - 2 x daily - 7 x weekly - 1 sets - 5-10 reps - 3-5 sec hold Plank with Hip Extension on Counter - 2 x daily - 7 x weekly - 1 sets - 5-10 reps - 3-5 hold Mountain Climber on Counter - 2 x daily - 7 x weekly - 1 sets - 10 reps - 1-2 hold Plank with Thoracic Rotation on  Counter - 2 x daily - 7 x weekly - 1 sets - 10 reps - 1-2 sec hold Shoulder Overhead Press in Flexion with Dumbbells - 2 x daily - 7 x weekly - 1 sets - 10 reps - 3 sec hold Standing Shoulder Flexion Stretch on Wall - 2 x daily - 7 x weekly - 2 sets - 10 reps - 3 sec hold

## 2021-02-12 NOTE — Therapy (Signed)
[facility] [place] [address] [place], [place], [address] Phone: [phone]   Fax:  [phone]  Physical Therapy Treatment  Patient Details  Name: Adrian [name] MRN: [id number] Date of Birth: Jul 25, 1960 Referring Provider (PT): Dr Ramond Marrow   Encounter Date: 02/12/2021   PT End of Session - 02/12/21 1022     Visit Number 21    Number of Visits 28    Date for PT Re-Evaluation 03/26/21    Authorization Type BCBS    PT Start Time 1015    PT Stop Time 1100    PT Time Calculation (min) 45 min    Activity Tolerance Patient tolerated treatment well             Past Medical History:  Diagnosis Date   Chronic right hip pain 08/06/2017   Chronic right-sided low back pain without sciatica 08/06/2017   GERD (gastroesophageal reflux disease)    Hypertension    Hypertriglyceridemia 08/06/2017   Seasonal allergies     Past Surgical History:  Procedure Laterality Date   ESOPHAGOGASTRODUODENOSCOPY     NO PAST SURGERIES     SHOULDER ARTHROSCOPY WITH SUBACROMIAL DECOMPRESSION AND BICEP TENDON REPAIR Left 05/31/2020   Procedure: LEFT SHOULDER ARTHROSCOPY WITH DEBRIDEMENT, DISTAL CLAVICAL EXCISION,  SUBACROMIAL DECOMPRESSION, BICEPS TENODESIS;  Surgeon: [name], MD;  Location: [facility];  Service: Orthopedics;  Laterality: Left;    There were no vitals filed for this visit.       [facility] PT Assessment - 02/12/21 0001       Assessment   Medical Diagnosis Rt/Lt RTC repair with bicep tendodesis and DCR    Referring Provider (PT) Dr Ramond Marrow    Onset Date/Surgical Date 10/08/20    Hand Dominance Right    Next MD Visit PRN    Prior Therapy here for Lt shoulder      AROM   Right Shoulder Extension 63 Degrees    Right Shoulder Flexion 143 Degrees    Right Shoulder ABduction 150 Degrees    Right Shoulder Internal Rotation --   thumb to T10   Right Shoulder External Rotation 90 Degrees   shoulder 90  deg/elbow 90 deg   Left Shoulder Extension 60 Degrees    Left Shoulder Flexion 144 Degrees    Left Shoulder ABduction 142 Degrees    Left Shoulder Internal Rotation --   thumb to T8   Left Shoulder External Rotation 90 Degrees    Right/Left Forearm --   WFL's bilat   Right/Left Wrist --   WFL's bilat     Strength   Right Shoulder Flexion 4-/5    Right Shoulder Extension 5/5    Right Shoulder ABduction 4/5    Right Shoulder Internal Rotation 4+/5    Right Shoulder External Rotation 4+/5    Left Shoulder Flexion 5/5    Left Shoulder Extension 5/5    Left Shoulder ABduction 5/5    Left Shoulder Internal Rotation 5/5    Left Shoulder External Rotation 5/5                           [facility] Adult PT Treatment/Exercise - 02/12/21 0001       Shoulder Exercises: Standing   Protraction Limitations swinging 5# KB knees to chest level x 20 reps    Flexion Strengthening;Right;15 reps;Theraband    Theraband Level (Shoulder Flexion) Level 3 (Green)    Extension Strengthening;Right;15 reps;Theraband  Theraband Level (Shoulder Extension) Level 3 (Green)    Other Standing Exercises Rt/Lt shoulder lifting 5# to overhead  Bilat 5# overhead lift      Shoulder Exercises: ROM/Strengthening   UBE (Upper Arm Bike) L8 x 4 min 2 min fwd/2 min back    Wall Pushups 10 reps    Wall Pushups Limitations reverse wall push ups x 10    Pushups 10 reps    Pushups Limitations counter push ups x 10    X to V Arms V on wall 10 reps x 2 sets      Shoulder Exercises: Body Blade   Flexion 60 seconds    ABduction 60 seconds                     PT Education - 02/12/21 1049     Education Details HEP POC    Person(s) Educated Patient    Methods Explanation;Demonstration;Tactile cues;Verbal cues;Handout    Comprehension Verbalized understanding;Returned demonstration;Verbal cues required;Tactile cues required              PT Short Term Goals - 11/22/20 1059       PT SHORT  TERM GOAL #1   Title Instruct patient in AROM Rt forearm, wrist, hand    Time 4    Period Weeks    Status Achieved      PT SHORT TERM GOAL #2   Title begin Rt shoulder rehab as directed my Dr Everardo Pacific    Time 6    Period Weeks    Status Achieved      PT SHORT TERM GOAL #3   Title Independent in postural correction    Time 6    Period Weeks    Status On-going      PT SHORT TERM GOAL #4   Title Independent in self dressing and grooming    Time 6    Period Weeks    Status Achieved               PT Long Term Goals - 02/12/21 1100       PT LONG TERM GOAL #1   Title independent with initial HEP    Time 6    Period Weeks    Status New    Target Date 03/26/21      PT LONG TERM GOAL #2   Title demo Rt shoulder strength =/> 4+/5 to5/5 to allow initial return to activities at work and in his yard    Time 6    Period Weeks    Status New    Target Date 03/26/21      PT LONG TERM GOAL #3   Title Rt shoulder ROM WFL's and painfree    Time 12    Period Weeks    Status Achieved      PT LONG TERM GOAL #4   Title patient reports ability to rest and sleep with minimal to no pain in Rt shoulder    Time 12    Period Weeks    Status Achieved      PT LONG TERM GOAL #5   Title Independent in all strengthening exercises and activities prior to d/c    Time 6    Period Weeks    Status New    Target Date 03/26/21                   Plan - 02/12/21 1039     Clinical Impression Statement Patient demonstrates  increased Rt shoulder ROM and strength. He continues to have weakness in the Rt shoulder with flexion, abduction, ER. Will benefit form continued treatment to progress with Rt shoudler strength.    Rehab Potential Good    PT Frequency 2x / week    PT Duration 12 weeks    PT Treatment/Interventions Iontophoresis 4mg /ml Dexamethasone;Taping;Vasopneumatic Device;Patient/family education;Moist Heat;Therapeutic activities;Passive range of motion;Dry  needling;Therapeutic exercise;Cryotherapy;Electrical Stimulation;Neuromuscular re-education;Manual techniques    PT Next Visit Plan continue with Phase IV strengthening - working on shoulder flexion, abduction, ER    PT Home Exercise Plan [email or web address] - patient will refer to previous exercises does not want reprinted exercises    Consulted and Agree with Plan of Care Patient             Patient will benefit from skilled therapeutic intervention in order to improve the following deficits and impairments:     Visit Diagnosis: Stiffness of right shoulder joint  Muscle weakness (generalized)  Chronic right shoulder pain     Problem List Patient Active Problem List   Diagnosis Date Noted   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 04/04/2020   Primary osteoarthritis involving multiple joints 07/26/2018   Acute left ankle pain 07/26/2018   Cerumen debris on tympanic membrane of right ear 07/26/2018   Tinea pedis 07/21/2018   Periscapular pain 07/20/2018   Retrocalcaneal bursitis, left 07/20/2018   Hepatitis B core antibody positive 07/20/2018   Polyarthralgia 07/20/2018   At risk for obstructive sleep apnea 07/20/2018   Hearing loss due to cerumen impaction, right 11/02/2017   Seasonal allergic rhinitis due to pollen 11/02/2017   Fasting hyperglycemia 08/06/2017   Primary osteoarthritis of right hip 08/06/2017   Chronic right-sided low back pain without sciatica 08/06/2017   Hypertriglyceridemia 08/06/2017   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   Gastroesophageal reflux disease without esophagitis 07/09/2017   Hypertension goal BP (blood pressure) < 130/80 07/09/2017   Class 2 obesity due to excess calories in adult 07/09/2017    Clarabelle Oscarson 07/11/2017, PT, MPH 02/12/2021, 11:03 AM  [facility] [place] [address] [place], [place], [place] Phone: [phone]   Fax:  [phone]  Name: BRYNDEN THUNE MRN: Frankey Poot Date of  Birth: 12-19-1960

## 2021-02-14 ENCOUNTER — Encounter: Payer: BC Managed Care – PPO | Admitting: Physical Therapy

## 2021-02-21 ENCOUNTER — Encounter: Payer: BC Managed Care – PPO | Admitting: Rehabilitative and Restorative Service Providers"

## 2021-02-26 ENCOUNTER — Ambulatory Visit: Payer: BC Managed Care – PPO | Admitting: Rehabilitative and Restorative Service Providers"

## 2021-02-26 ENCOUNTER — Encounter: Payer: Self-pay | Admitting: Rehabilitative and Restorative Service Providers"

## 2021-02-26 ENCOUNTER — Other Ambulatory Visit: Payer: Self-pay

## 2021-02-26 DIAGNOSIS — G8929 Other chronic pain: Secondary | ICD-10-CM | POA: Diagnosis not present

## 2021-02-26 DIAGNOSIS — M25611 Stiffness of right shoulder, not elsewhere classified: Secondary | ICD-10-CM

## 2021-02-26 DIAGNOSIS — M25511 Pain in right shoulder: Secondary | ICD-10-CM | POA: Diagnosis not present

## 2021-02-26 DIAGNOSIS — M6281 Muscle weakness (generalized): Secondary | ICD-10-CM | POA: Diagnosis not present

## 2021-02-26 NOTE — Therapy (Signed)
[facility] [place] [address] [place], [place], [address] Phone: [phone]   Fax:  [phone]  Physical Therapy Treatment  Patient Details  Name: Adrian [name] MRN: [id number] Date of Birth: 12-25-1960 Referring Provider (PT): Dr Ramond Marrow   Encounter Date: 02/26/2021   PT End of Session - 02/26/21 1017     Visit Number 22    Number of Visits 28    Date for PT Re-Evaluation 03/26/21    Authorization Type BCBS    PT Start Time 1015    PT Stop Time 1100    PT Time Calculation (min) 45 min    Activity Tolerance Patient tolerated treatment well             Past Medical History:  Diagnosis Date   Chronic right hip pain 08/06/2017   Chronic right-sided low back pain without sciatica 08/06/2017   GERD (gastroesophageal reflux disease)    Hypertension    Hypertriglyceridemia 08/06/2017   Seasonal allergies     Past Surgical History:  Procedure Laterality Date   ESOPHAGOGASTRODUODENOSCOPY     NO PAST SURGERIES     SHOULDER ARTHROSCOPY WITH SUBACROMIAL DECOMPRESSION AND BICEP TENDON REPAIR Left 05/31/2020   Procedure: LEFT SHOULDER ARTHROSCOPY WITH DEBRIDEMENT, DISTAL CLAVICAL EXCISION,  SUBACROMIAL DECOMPRESSION, BICEPS TENODESIS;  Surgeon: [name], MD;  Location: [facility];  Service: Orthopedics;  Laterality: Left;    There were no vitals filed for this visit.   Subjective Assessment - 02/26/21 1017     Subjective Patient reports that his Rt shoulder is doing okay - no soreness or pain. He has some popping and cracking in both shoulders and his ribs hurt from when he fell ~ 4 weeks ago.    Currently in Pain? No/denies    Pain Score 0-No pain    Pain Location Shoulder    Pain Orientation Right    Pain Descriptors / Indicators --   popping and cracking               [facility] PT Assessment - 02/26/21 0001       Assessment   Medical Diagnosis Rt/Lt RTC repair with bicep tendodesis and  DCR    Referring Provider (PT) Dr Ramond Marrow    Onset Date/Surgical Date 10/08/20    Hand Dominance Right    Next MD Visit PRN    Prior Therapy here for Lt shoulder      AROM   Right Shoulder Extension 63 Degrees    Right Shoulder Flexion 144 Degrees    Right Shoulder ABduction 150 Degrees    Right Shoulder Internal Rotation --   thumb to T10   Right Shoulder External Rotation 90 Degrees   shoulder 90 deg/elbow 90 deg   Left Shoulder Extension 60 Degrees    Left Shoulder Flexion 144 Degrees    Left Shoulder ABduction 142 Degrees    Left Shoulder Internal Rotation --   thumb to T8   Left Shoulder External Rotation 90 Degrees      Strength   Right Shoulder Flexion 4+/5    Right Shoulder Extension 5/5    Right Shoulder ABduction 4+/5    Right Shoulder Internal Rotation 5/5    Right Shoulder External Rotation 4+/5    Left Shoulder Flexion 5/5    Left Shoulder Extension 5/5    Left Shoulder ABduction 5/5    Left Shoulder Internal Rotation 5/5    Left Shoulder External Rotation 5/5  [facility] Adult PT Treatment/Exercise - 02/26/21 0001       Shoulder Exercises: Seated   Other Seated Exercises lateral trunk flexion stretch 20 sec x 3 reps each side      Shoulder Exercises: Prone   Retraction Strengthening;Both;15 reps    Other Prone Exercises row with ER x 10      Shoulder Exercises: Sidelying   Flexion Strengthening;Right;20 reps      Shoulder Exercises: Standing   Flexion Strengthening;Right;15 reps;Theraband    Theraband Level (Shoulder Flexion) Level 4 (Blue)    Extension Strengthening;Right;15 reps;Theraband    Theraband Level (Shoulder Extension) Level 4 (Blue)    Row Strengthening;Both;20 reps;Theraband    Theraband Level (Shoulder Row) Level 4 (Blue)    Row Limitations bow and arrow      Shoulder Exercises: ROM/Strengthening   UBE (Upper Arm Bike) L9 x 4 min 2 min fwd/2 min back    Wall Pushups 10 reps    Wall Pushups  Limitations reverse wall push ups x 10    Pushups 10 reps    Pushups Limitations counter push ups x 10    Plank 5 reps    Plank Limitations on forearms 5 sec to 10 sec    X to V Arms V on wall 10 reps x 2 sets                     PT Education - 02/26/21 1045     Education Details HEP    Person(s) Educated Patient    Methods Explanation;Demonstration;Tactile cues;Verbal cues;Handout    Comprehension Verbalized understanding;Returned demonstration;Verbal cues required;Tactile cues required              PT Short Term Goals - 11/22/20 1059       PT SHORT TERM GOAL #1   Title Instruct patient in AROM Rt forearm, wrist, hand    Time 4    Period Weeks    Status Achieved      PT SHORT TERM GOAL #2   Title begin Rt shoulder rehab as directed my Dr Everardo Pacific    Time 6    Period Weeks    Status Achieved      PT SHORT TERM GOAL #3   Title Independent in postural correction    Time 6    Period Weeks    Status On-going      PT SHORT TERM GOAL #4   Title Independent in self dressing and grooming    Time 6    Period Weeks    Status Achieved               PT Long Term Goals - 02/12/21 1100       PT LONG TERM GOAL #1   Title independent with initial HEP    Time 6    Period Weeks    Status New    Target Date 03/26/21      PT LONG TERM GOAL #2   Title demo Rt shoulder strength =/> 4+/5 to5/5 to allow initial return to activities at work and in his yard    Time 6    Period Weeks    Status New    Target Date 03/26/21      PT LONG TERM GOAL #3   Title Rt shoulder ROM WFL's and painfree    Time 12    Period Weeks    Status Achieved      PT LONG TERM GOAL #4   Title  patient reports ability to rest and sleep with minimal to no pain in Rt shoulder    Time 12    Period Weeks    Status Achieved      PT LONG TERM GOAL #5   Title Independent in all strengthening exercises and activities prior to d/c    Time 6    Period Weeks    Status New    Target  Date 03/26/21                   Plan - 02/26/21 1019     Clinical Impression Statement Continued work on higher level strengthening exercises to address weakness in Rt shoulder flexion, abduction, ER.    Rehab Potential Good    PT Frequency 2x / week    PT Duration 12 weeks    PT Treatment/Interventions Iontophoresis 4mg /ml Dexamethasone;Taping;Vasopneumatic Device;Patient/family education;Moist Heat;Therapeutic activities;Passive range of motion;Dry needling;Therapeutic exercise;Cryotherapy;Electrical Stimulation;Neuromuscular re-education;Manual techniques    PT Next Visit Plan continue with Phase IV strengthening - working on shoulder flexion, abduction, ER    PT Home Exercise Plan [email or web address] - patient will refer to previous exercises does not want reprinted exercises    Consulted and Agree with Plan of Care Patient             Patient will benefit from skilled therapeutic intervention in order to improve the following deficits and impairments:     Visit Diagnosis: Stiffness of right shoulder joint  Muscle weakness (generalized)  Chronic right shoulder pain     Problem List Patient Active Problem List   Diagnosis Date Noted   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 04/04/2020   Primary osteoarthritis involving multiple joints 07/26/2018   Acute left ankle pain 07/26/2018   Cerumen debris on tympanic membrane of right ear 07/26/2018   Tinea pedis 07/21/2018   Periscapular pain 07/20/2018   Retrocalcaneal bursitis, left 07/20/2018   Hepatitis B core antibody positive 07/20/2018   Polyarthralgia 07/20/2018   At risk for obstructive sleep apnea 07/20/2018   Hearing loss due to cerumen impaction, right 11/02/2017   Seasonal allergic rhinitis due to pollen 11/02/2017   Fasting hyperglycemia 08/06/2017   Primary osteoarthritis of right hip 08/06/2017   Chronic right-sided low back pain without sciatica 08/06/2017   Hypertriglyceridemia 08/06/2017    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   Gastroesophageal reflux disease without esophagitis 07/09/2017   Hypertension goal BP (blood pressure) < 130/80 07/09/2017   Class 2 obesity due to excess calories in adult 07/09/2017    Randilyn Foisy 07/11/2017, PT, MPH  02/26/2021, 10:57 AM  [facility] [address] [place], [place], [place] Phone: [phone]   Fax:  [phone]  Name: Adrian [name] MRN: Frankey Poot Date of Birth: 04-27-1961

## 2021-02-26 NOTE — Patient Instructions (Signed)
Access Code: [email or web address] URL: [email or web address] Date: 02/26/2021 Prepared by: Corlis Leak  Exercises Flexion-Extension Shoulder Pendulum with Table Support - 1 x daily - 7 x weekly - 15 reps - 3 sets Seated Scapular Retraction - 3-4 x daily - 7 x weekly - 10 reps - 5 sec hold Standing 'L' Stretch at [employer] - 3-4 x daily - 10-15 reps Seated Shoulder External Rotation AAROM with Cane and Hand in Neutral - 2 x daily - 7 x weekly - 1 sets - 5-10 reps - 5-10 sec hold Seated Shoulder Flexion AAROM with Pulley Behind - 2 x daily - 7 x weekly - 1 sets - 10 reps - 10 sec hold Seated Shoulder Scaption AAROM with Pulley at Side - 2 x daily - 7 x weekly - 1 sets - 10 reps - 10sec hold Supine Shoulder Flexion Extension AAROM with Dowel - 2 x daily - 7 x weekly - 1 sets - 10 reps - 3-5sec hold Corner Pec Major Stretch - 1 x daily - 7 x weekly - 1 sets - 2-3 reps - 20 seconds hold Split Stance Shoulder Row with Resistance - 1 x daily - 3 x weekly - 2-3 sets - 10 reps Shoulder External Rotation with Anchored Resistance - 1 x daily - 3 x weekly - 2-3 sets - 10 reps Standing Shoulder Internal Rotation with Anchored Resistance - 1 x daily - 3 x weekly - 2-3 sets - 10 reps Shoulder Extension Reactive Isometrics with Elbow at 90 - 2 x daily - 7 x weekly - 1-2 sets - 10 reps - 2-3 sec hold Standing Eccentric Shoulder Flexion at Wall - 1 x daily - 7 x weekly - 1 sets - 5-10 reps - 3 sec hold Isometric Shoulder External Rotation in Abduction with Ball at Wall - 2 x daily - 7 x weekly - 1 sets - 5-10 reps - 30-60 sec hold Plank on Counter - 2 x daily - 7 x weekly - 1 sets - 3 reps - 30 sec hold Plank with Shoulder Flexion on Counter - 2 x daily - 7 x weekly - 1 sets - 5-10 reps - 3-5 sec hold Plank with Hip Extension on Counter - 2 x daily - 7 x weekly - 1 sets - 5-10 reps - 3-5 hold Mountain Climber on Counter - 2 x daily - 7 x weekly - 1 sets - 10 reps - 1-2 hold Plank with Thoracic Rotation on  Counter - 2 x daily - 7 x weekly - 1 sets - 10 reps - 1-2 sec hold Shoulder Overhead Press in Flexion with Dumbbells - 2 x daily - 7 x weekly - 1 sets - 10 reps - 3 sec hold Standing Shoulder Flexion Stretch on Wall - 2 x daily - 7 x weekly - 2 sets - 10 reps - 3 sec hold Shoulder Flexion Wall Slide with Resistance Band - 1 x daily - 7 x weekly - 2 sets - 10 reps - 3 sec hold Wall Clock with Theraband - 2 x daily - 7 x weekly - 1 sets - 3 reps - 3 sec hold Side-lying Shoulder PNF D2 Flexion and Extension - 1 x daily - 7 x weekly - 2 sets - 10 reps - 3 sec hold Plank on Forearms with Scapular Protraction Retraction AROM - 1 x daily - 7 x weekly - 1 sets - 5 reps - 10-30 sec hold Prone Scapular Retraction in Abduction - 2 x daily - 7 x  weekly - 1 sets - 5-10 reps - 3-5 sec hold

## 2021-03-05 ENCOUNTER — Encounter: Payer: BC Managed Care – PPO | Admitting: Rehabilitative and Restorative Service Providers"

## 2021-03-07 ENCOUNTER — Ambulatory Visit: Payer: BC Managed Care – PPO | Admitting: Physical Therapy

## 2021-03-07 ENCOUNTER — Other Ambulatory Visit: Payer: Self-pay

## 2021-03-07 DIAGNOSIS — M6281 Muscle weakness (generalized): Secondary | ICD-10-CM | POA: Diagnosis not present

## 2021-03-07 DIAGNOSIS — M25611 Stiffness of right shoulder, not elsewhere classified: Secondary | ICD-10-CM | POA: Diagnosis not present

## 2021-03-07 DIAGNOSIS — G8929 Other chronic pain: Secondary | ICD-10-CM

## 2021-03-07 DIAGNOSIS — M25511 Pain in right shoulder: Secondary | ICD-10-CM | POA: Diagnosis not present

## 2021-03-07 NOTE — Therapy (Signed)
[facility] [place] [address] [place], [place], [address] Phone: [phone]   Fax:  [phone]  Physical Therapy Treatment  Patient Details  Name: Adrian [name] MRN: [id number] Date of Birth: Jul 31, 1960 Referring Provider (PT): Dr Ramond Marrow   Encounter Date: 03/07/2021   PT End of Session - 03/07/21 1507     Visit Number 23    Number of Visits 28    Date for PT Re-Evaluation 03/26/21    Authorization Type BCBS    PT Start Time 1453   pt arrived late   PT Stop Time 1532    PT Time Calculation (min) 39 min    Activity Tolerance Patient tolerated treatment well    Behavior During Therapy [facility] for tasks assessed/performed             Past Medical History:  Diagnosis Date   Chronic right hip pain 08/06/2017   Chronic right-sided low back pain without sciatica 08/06/2017   GERD (gastroesophageal reflux disease)    Hypertension    Hypertriglyceridemia 08/06/2017   Seasonal allergies     Past Surgical History:  Procedure Laterality Date   ESOPHAGOGASTRODUODENOSCOPY     NO PAST SURGERIES     SHOULDER ARTHROSCOPY WITH SUBACROMIAL DECOMPRESSION AND BICEP TENDON REPAIR Left 05/31/2020   Procedure: LEFT SHOULDER ARTHROSCOPY WITH DEBRIDEMENT, DISTAL CLAVICAL EXCISION,  SUBACROMIAL DECOMPRESSION, BICEPS TENODESIS;  Surgeon: [name], MD;  Location: [facility];  Service: Orthopedics;  Laterality: Left;    There were no vitals filed for this visit.   Subjective Assessment - 03/07/21 1502     Subjective Pt reports that he has been having more tingling in hands lately.  He is noticing that he is having difficulty gripping things first thing in morning.  He is waking in the night with pain in both hands.  His Lt shoulder felt like it was in a bind as soon as he layed on it last night; wasn't sure if it was from spreading mulch.     Patient Stated Goals use the Rt UE as normal.  HIking , build things, and shoot  a gun and landscaping    Currently in Pain? No/denies    Pain Score 0-No pain                [facility] PT Assessment - 03/07/21 0001       Assessment   Medical Diagnosis Rt/Lt RTC repair with bicep tendodesis and DCR    Referring Provider (PT) Dr Ramond Marrow    Onset Date/Surgical Date 10/08/20    Hand Dominance Right    Next MD Visit PRN    Prior Therapy here for Lt shoulder              [facility] Adult PT Treatment/Exercise - 03/07/21 0001       Neck Exercises: Stretches   Upper Trapezius Stretch Right;Left;2 reps;10 seconds      Lumbar Exercises: Quadruped   Single Arm Raise Right;Left;10 reps    Other Quadruped Lumbar Exercises single leg high kneeling with wood chop holding 11# ball x 5 each side.      Shoulder Exercises: Supine   Other Supine Exercises supine snow angel (abdct 90) x 30 sec, 2 reps 2 sets., then 8 active snow angels.      Shoulder Exercises: Prone   Other Prone Exercises W's with axial ext, 3 sec hold, x 10      Shoulder Exercises: Standing   Shoulder  Elevation Limitations bicep curl to overhead press with 5# x 10 each arm, 8# x 10 each arm.      Shoulder Exercises: ROM/Strengthening   UBE (Upper Arm Bike) L7 x 4 min 2 min fwd/2 min back    Other ROM/Strengthening Exercises high plank with shoulder taps x 10.  forearm plank with press up plus through serratus x 4 reps -      Shoulder Exercises: Stretch   Other Shoulder Stretches Rt/Lt bicep stretch x 20 sec x 2 reps.    Other Shoulder Stretches middle and high doorway stretch x 15 sec x 2 reps each;  prayer stretch x 30 sec                       PT Short Term Goals - 11/22/20 1059       PT SHORT TERM GOAL #1   Title Instruct patient in AROM Rt forearm, wrist, hand    Time 4    Period Weeks    Status Achieved      PT SHORT TERM GOAL #2   Title begin Rt shoulder rehab as directed my Dr Everardo Pacific    Time 6    Period Weeks    Status Achieved      PT SHORT TERM GOAL #3   Title  Independent in postural correction    Time 6    Period Weeks    Status On-going      PT SHORT TERM GOAL #4   Title Independent in self dressing and grooming    Time 6    Period Weeks    Status Achieved               PT Long Term Goals - 02/12/21 1100       PT LONG TERM GOAL #1   Title independent with initial HEP    Time 6    Period Weeks    Status New    Target Date 03/26/21      PT LONG TERM GOAL #2   Title demo Rt shoulder strength =/> 4+/5 to5/5 to allow initial return to activities at work and in his yard    Time 6    Period Weeks    Status New    Target Date 03/26/21      PT LONG TERM GOAL #3   Title Rt shoulder ROM WFL's and painfree    Time 12    Period Weeks    Status Achieved      PT LONG TERM GOAL #4   Title patient reports ability to rest and sleep with minimal to no pain in Rt shoulder    Time 12    Period Weeks    Status Achieved      PT LONG TERM GOAL #5   Title Independent in all strengthening exercises and activities prior to d/c    Time 6    Period Weeks    Status New    Target Date 03/26/21                   Plan - 03/07/21 1609     Clinical Impression Statement Pt able to complete overhead press with 8# without difficulty.  Quadruped and plank position continue to present challenge with WB through single arm.  He did complain of tingling in hands after completing the warm up on UBE.  This resolved with rest and UE stretches.  Encouraged pt to be mindful of  neck position/ pillows with sleep position, in the event the tightness is coming from neck.  Pt is progressing well towards remaining LTGs.    Rehab Potential Good    PT Frequency 2x / week    PT Duration 12 weeks    PT Treatment/Interventions Iontophoresis 4mg /ml Dexamethasone;Taping;Vasopneumatic Device;Patient/family education;Moist Heat;Therapeutic activities;Passive range of motion;Dry needling;Therapeutic exercise;Cryotherapy;Electrical Stimulation;Neuromuscular  re-education;Manual techniques    PT Next Visit Plan continue with Phase IV strengthening - working on shoulder flexion, abduction, ER    PT Home Exercise Plan [id number] - patient will refer to previous exercises does not want reprinted exercises    Consulted and Agree with Plan of Care Patient             Patient will benefit from skilled therapeutic intervention in order to improve the following deficits and impairments:  Decreased range of motion, Impaired UE functional use, Increased muscle spasms, Pain, Postural dysfunction, Decreased strength  Visit Diagnosis: Stiffness of right shoulder joint  Muscle weakness (generalized)  Chronic right shoulder pain     Problem List Patient Active Problem List   Diagnosis Date Noted   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 04/04/2020   Primary osteoarthritis involving multiple joints 07/26/2018   Acute left ankle pain 07/26/2018   Cerumen debris on tympanic membrane of right ear 07/26/2018   Tinea pedis 07/21/2018   Periscapular pain 07/20/2018   Retrocalcaneal bursitis, left 07/20/2018   Hepatitis B core antibody positive 07/20/2018   Polyarthralgia 07/20/2018   At risk for obstructive sleep apnea 07/20/2018   Hearing loss due to cerumen impaction, right 11/02/2017   Seasonal allergic rhinitis due to pollen 11/02/2017   Fasting hyperglycemia 08/06/2017   Primary osteoarthritis of right hip 08/06/2017   Chronic right-sided low back pain without sciatica 08/06/2017   Hypertriglyceridemia 08/06/2017   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   Gastroesophageal reflux disease without esophagitis 07/09/2017   Hypertension goal BP (blood pressure) < 130/80 07/09/2017   Class 2 obesity due to excess calories in adult 07/09/2017   07/11/2017, PTA 03/07/21 4:16 PM  [facility] [place] [address] [place], [place], [place] Phone: [phone]   Fax:   [phone]  Name: Adrian [name] MRN: Frankey Poot Date of Birth: 1960-06-20

## 2021-03-12 ENCOUNTER — Encounter: Payer: Self-pay | Admitting: Rehabilitative and Restorative Service Providers"

## 2021-03-12 ENCOUNTER — Other Ambulatory Visit: Payer: Self-pay

## 2021-03-12 ENCOUNTER — Ambulatory Visit: Payer: BC Managed Care – PPO | Admitting: Rehabilitative and Restorative Service Providers"

## 2021-03-12 DIAGNOSIS — M6281 Muscle weakness (generalized): Secondary | ICD-10-CM | POA: Diagnosis not present

## 2021-03-12 DIAGNOSIS — M25611 Stiffness of right shoulder, not elsewhere classified: Secondary | ICD-10-CM

## 2021-03-12 DIAGNOSIS — G8929 Other chronic pain: Secondary | ICD-10-CM | POA: Diagnosis not present

## 2021-03-12 DIAGNOSIS — M25511 Pain in right shoulder: Secondary | ICD-10-CM | POA: Diagnosis not present

## 2021-03-12 NOTE — Therapy (Signed)
[facility] [place] [address] [place], [place], [address] Phone: [phone]   Fax:  [phone]  Physical Therapy Treatment  Patient Details  Name: Adrian [name] MRN: [id number] Date of Birth: Nov 03, 1960 Referring Provider (PT): Dr Ramond Marrow   Encounter Date: 03/12/2021   PT End of Session - 03/12/21 1106     Visit Number 24    Number of Visits 28    Date for PT Re-Evaluation 03/26/21    Authorization Type BCBS    PT Start Time 1104    PT Stop Time 1152   MH end of treatment   PT Time Calculation (min) 48 min    Activity Tolerance Patient tolerated treatment well             Past Medical History:  Diagnosis Date   Chronic right hip pain 08/06/2017   Chronic right-sided low back pain without sciatica 08/06/2017   GERD (gastroesophageal reflux disease)    Hypertension    Hypertriglyceridemia 08/06/2017   Seasonal allergies     Past Surgical History:  Procedure Laterality Date   ESOPHAGOGASTRODUODENOSCOPY     NO PAST SURGERIES     SHOULDER ARTHROSCOPY WITH SUBACROMIAL DECOMPRESSION AND BICEP TENDON REPAIR Left 05/31/2020   Procedure: LEFT SHOULDER ARTHROSCOPY WITH DEBRIDEMENT, DISTAL CLAVICAL EXCISION,  SUBACROMIAL DECOMPRESSION, BICEPS TENODESIS;  Surgeon: [name], MD;  Location: [facility];  Service: Orthopedics;  Laterality: Left;    There were no vitals filed for this visit.   Subjective Assessment - 03/12/21 1106     Subjective Using LE's for functional activities - Rt shoulder is sore when he sleeps on the Rt side. Feels that therapy is helpful - still making progress with strength and flexiblity    Currently in Pain? No/denies    Pain Score 0-No pain    Pain Location Shoulder    Pain Orientation Right                [facility] PT Assessment - 03/12/21 0001       Assessment   Medical Diagnosis Rt/Lt RTC repair with bicep tendodesis and DCR    Referring Provider (PT) Dr Ramond Marrow    Onset Date/Surgical Date 10/08/20    Hand Dominance Right    Next MD Visit PRN    Prior Therapy here for Lt shoulder      Special Tests   Other special tests (+) neural tension test Rt > Lt                           [facility] Adult PT Treatment/Exercise - 03/12/21 0001       Neck Exercises: Stretches   Upper Trapezius Stretch Right;Left;2 reps;10 seconds      Shoulder Exercises: Supine   Other Supine Exercises neural mobilization 1 min x 2 reps each UE; cervical chin tuck, lateral flexion and rotation x 3 each side      Shoulder Exercises: ROM/Strengthening   UBE (Upper Arm Bike) L8 x 4 min 2 min fwd/2 min back      Moist Heat Therapy   Number Minutes Moist Heat 10 Minutes    Moist Heat Location Shoulder      Manual Therapy   Manual therapy comments skilled palpation to assess reponse to DN and manual work    Joint Mobilization circumduction of Rt GH joint with distraction to encourage muscle relaxation    Soft tissue mobilization Lt/Rt pecs;  upper trap; biceps    Scapular Mobilization Rt    Passive ROM R shoulder flexion, scaption in scapular plane; ER in scapular plane    Manual Traction traction through the long arm Lt UE 5-10 sec several reps during PROM/stretching              Trigger Point Dry Needling - 03/12/21 0001     Consent Given? Yes    Education Handout Provided Previously provided    Statistician Performed with Dry Needling Yes    E-stim with Dry Needling Details post tendon needling - mA x 5 min; micro current x % min to pt tolerance    Upper Trapezius Response Twitch reponse elicited;Palpable increased muscle length    Pectoralis Major Response Palpable increased muscle length;Twitch response elicited    Pectoralis Minor Response Palpable increased muscle length    Supraspinatus Response Palpable increased muscle length;Twitch response elicited    Deltoid Response Palpable increased muscle length;Twitch response  elicited    Biceps Response Palpable increased muscle length;Twitch response elicited                     PT Short Term Goals - 11/22/20 1059       PT SHORT TERM GOAL #1   Title Instruct patient in AROM Rt forearm, wrist, hand    Time 4    Period Weeks    Status Achieved      PT SHORT TERM GOAL #2   Title begin Rt shoulder rehab as directed my Dr Everardo Pacific    Time 6    Period Weeks    Status Achieved      PT SHORT TERM GOAL #3   Title Independent in postural correction    Time 6    Period Weeks    Status On-going      PT SHORT TERM GOAL #4   Title Independent in self dressing and grooming    Time 6    Period Weeks    Status Achieved               PT Long Term Goals - 02/12/21 1100       PT LONG TERM GOAL #1   Title independent with initial HEP    Time 6    Period Weeks    Status New    Target Date 03/26/21      PT LONG TERM GOAL #2   Title demo Rt shoulder strength =/> 4+/5 to5/5 to allow initial return to activities at work and in his yard    Time 6    Period Weeks    Status New    Target Date 03/26/21      PT LONG TERM GOAL #3   Title Rt shoulder ROM WFL's and painfree    Time 12    Period Weeks    Status Achieved      PT LONG TERM GOAL #4   Title patient reports ability to rest and sleep with minimal to no pain in Rt shoulder    Time 12    Period Weeks    Status Achieved      PT LONG TERM GOAL #5   Title Independent in all strengthening exercises and activities prior to d/c    Time 6    Period Weeks    Status New    Target Date 03/26/21                   Plan -  03/12/21 1119     Clinical Impression Statement Patient reports continued gradual progress with strength and functional activities. He is increasing functional use of UE's at home. He continues to have numbness and tingling in the Lt hand every night. Occasionally in the Rt. He has (+) neural tension test bilat. Added neural mobilization bilat UE's.    Rehab  Potential Good    PT Frequency 2x / week    PT Duration 12 weeks    PT Treatment/Interventions Iontophoresis 4mg /ml Dexamethasone;Taping;Vasopneumatic Device;Patient/family education;Moist Heat;Therapeutic activities;Passive range of motion;Dry needling;Therapeutic exercise;Cryotherapy;Electrical Stimulation;Neuromuscular re-education;Manual techniques    PT Next Visit Plan continue with Phase IV strengthening - working on shoulder flexion, abduction, ER    PT Home Exercise Plan [id number] - patient will refer to previous exercises does not want reprinted exercises    Consulted and Agree with Plan of Care Patient             Patient will benefit from skilled therapeutic intervention in order to improve the following deficits and impairments:     Visit Diagnosis: Stiffness of right shoulder joint  Muscle weakness (generalized)  Chronic right shoulder pain     Problem List Patient Active Problem List   Diagnosis Date Noted   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 04/04/2020   Primary osteoarthritis involving multiple joints 07/26/2018   Acute left ankle pain 07/26/2018   Cerumen debris on tympanic membrane of right ear 07/26/2018   Tinea pedis 07/21/2018   Periscapular pain 07/20/2018   Retrocalcaneal bursitis, left 07/20/2018   Hepatitis B core antibody positive 07/20/2018   Polyarthralgia 07/20/2018   At risk for obstructive sleep apnea 07/20/2018   Hearing loss due to cerumen impaction, right 11/02/2017   Seasonal allergic rhinitis due to pollen 11/02/2017   Fasting hyperglycemia 08/06/2017   Primary osteoarthritis of right hip 08/06/2017   Chronic right-sided low back pain without sciatica 08/06/2017   Hypertriglyceridemia 08/06/2017   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   Gastroesophageal reflux disease without esophagitis 07/09/2017   Hypertension goal BP (blood pressure) < 130/80 07/09/2017   Class 2 obesity due to excess calories in adult 07/09/2017     Natahlia Hoggard 07/11/2017, PT, MPH  03/12/2021, 5:36 PM  [facility] [address] [place], [place], [place] Phone: [phone]   Fax:  [phone]  Name: Adrian [name] MRN: Frankey Poot Date of Birth: 1960-08-06

## 2021-03-19 ENCOUNTER — Ambulatory Visit: Payer: BC Managed Care – PPO | Admitting: Rehabilitative and Restorative Service Providers"

## 2021-03-19 ENCOUNTER — Encounter: Payer: Self-pay | Admitting: Rehabilitative and Restorative Service Providers"

## 2021-03-19 ENCOUNTER — Other Ambulatory Visit: Payer: Self-pay

## 2021-03-19 DIAGNOSIS — M25611 Stiffness of right shoulder, not elsewhere classified: Secondary | ICD-10-CM

## 2021-03-19 DIAGNOSIS — M6281 Muscle weakness (generalized): Secondary | ICD-10-CM

## 2021-03-19 DIAGNOSIS — M25511 Pain in right shoulder: Secondary | ICD-10-CM

## 2021-03-19 DIAGNOSIS — G8929 Other chronic pain: Secondary | ICD-10-CM

## 2021-03-19 NOTE — Therapy (Signed)
[facility] [place] [address] [place], [place], [address] Phone: [phone]   Fax:  [phone]  Physical Therapy Treatment  Patient Details  Name: Adrian [name] MRN: [id number] Date of Birth: 1960/09/08 Referring Provider (PT): Dr Ramond Marrow   Encounter Date: 03/19/2021   PT End of Session - 03/19/21 1033     Visit Number 25    Number of Visits 28    Date for PT Re-Evaluation 03/26/21    Authorization Type BCBS    PT Start Time 1015    PT Stop Time 1102    PT Time Calculation (min) 47 min    Activity Tolerance Patient tolerated treatment well             Past Medical History:  Diagnosis Date   Chronic right hip pain 08/06/2017   Chronic right-sided low back pain without sciatica 08/06/2017   GERD (gastroesophageal reflux disease)    Hypertension    Hypertriglyceridemia 08/06/2017   Seasonal allergies     Past Surgical History:  Procedure Laterality Date   ESOPHAGOGASTRODUODENOSCOPY     NO PAST SURGERIES     SHOULDER ARTHROSCOPY WITH SUBACROMIAL DECOMPRESSION AND BICEP TENDON REPAIR Left 05/31/2020   Procedure: LEFT SHOULDER ARTHROSCOPY WITH DEBRIDEMENT, DISTAL CLAVICAL EXCISION,  SUBACROMIAL DECOMPRESSION, BICEPS TENODESIS;  Surgeon: [name], MD;  Location: [facility];  Service: Orthopedics;  Laterality: Left;    There were no vitals filed for this visit.   Subjective Assessment - 03/19/21 1037     Subjective Patient reports continued intermittent tingling in both hands. He has some pain in the Rt shoulder which is probably due to overworking his shoulders. He has noticed pain increase in the past 5-6 days.    Currently in Pain? No/denies    Pain Score 0-No pain   up to 4/10 with elevation of Rt shoulder   Pain Location Shoulder    Pain Orientation Right    Pain Descriptors / Indicators Aching    Pain Type Chronic pain;Surgical pain    Pain Radiating Towards bilat hands    Pain Onset  More than a month ago    Pain Frequency Intermittent    Aggravating Factors  varies    Pain Relieving Factors varies                [facility] PT Assessment - 03/19/21 0001       Assessment   Medical Diagnosis Rt/Lt RTC repair with bicep tendodesis and DCR    Referring Provider (PT) Dr Ramond Marrow    Onset Date/Surgical Date 10/08/20    Hand Dominance Right    Next MD Visit PRN    Prior Therapy here for Lt shoulder      AROM   Left Shoulder Flexion 87 Degrees   painful     Palpation   Palpation comment muscular tightness Rt shoulder girdle - significant tightness through anterior shoulder in biceps and supraspinitus tendons as well as through pecs, upper trap, leveator; deltoid      Special Tests   Other special tests (+) neural tension test Rt > Lt                           [facility] Adult PT Treatment/Exercise - 03/19/21 0001       Shoulder Exercises: Supine   Other Supine Exercises neural mobilization 1 min x 2 reps each UE; cervical chin tuck, lateral flexion and rotation  x 3 each side      Shoulder Exercises: ROM/Strengthening   UBE (Upper Arm Bike) L8 x 4 min 2 min fwd/2 min back      Manual Therapy   Manual therapy comments skilled palpation to assess reponse to DN and manual work    Joint Mobilization circumduction of Rt GH joint with distraction to encourage muscle relaxation    Soft tissue mobilization Lt/Rt pecs; upper trap; biceps    Scapular Mobilization Rt    Passive ROM R shoulder flexion, scaption in scapular plane; ER in scapular plane    Manual Traction traction through the long arm Lt UE 5-10 sec several reps during PROM/stretching              Trigger Point Dry Needling - 03/19/21 0001     Consent Given? Yes    Education Handout Provided Previously provided    Statistician Performed with Dry Needling Yes    E-stim with Dry Needling Details post tendon needling - mA x 5 min; micro current x % min to pt tolerance     Other Dry Needling Rt    Supraspinatus Response Palpable increased muscle length;Twitch response elicited    Biceps Response Palpable increased muscle length;Twitch response elicited                     PT Short Term Goals - 11/22/20 1059       PT SHORT TERM GOAL #1   Title Instruct patient in AROM Rt forearm, wrist, hand    Time 4    Period Weeks    Status Achieved      PT SHORT TERM GOAL #2   Title begin Rt shoulder rehab as directed my Dr Everardo Pacific    Time 6    Period Weeks    Status Achieved      PT SHORT TERM GOAL #3   Title Independent in postural correction    Time 6    Period Weeks    Status On-going      PT SHORT TERM GOAL #4   Title Independent in self dressing and grooming    Time 6    Period Weeks    Status Achieved               PT Long Term Goals - 02/12/21 1100       PT LONG TERM GOAL #1   Title independent with initial HEP    Time 6    Period Weeks    Status New    Target Date 03/26/21      PT LONG TERM GOAL #2   Title demo Rt shoulder strength =/> 4+/5 to5/5 to allow initial return to activities at work and in his yard    Time 6    Period Weeks    Status New    Target Date 03/26/21      PT LONG TERM GOAL #3   Title Rt shoulder ROM WFL's and painfree    Time 12    Period Weeks    Status Achieved      PT LONG TERM GOAL #4   Title patient reports ability to rest and sleep with minimal to no pain in Rt shoulder    Time 12    Period Weeks    Status Achieved      PT LONG TERM GOAL #5   Title Independent in all strengthening exercises and activities prior to d/c    Time 6  Period Weeks    Status New    Target Date 03/26/21                   Plan - 03/19/21 1059     Clinical Impression Statement Patient presents with significant increase in Rt shoulder pain with forward flexion. He has pain with elevation to ~ 90 deg - no pain at rest. He has muscuar tightness and tenderness through the anterior Rt shoulder in  biceps and supraspinitus. Trial of DNwith estim with good release of tightness and some decrease of pain in the Rt shoulder with elevation. Patient advised to rest and ice shoulder avoiding strenuous activities, then resume exercise gradually. Will assess changes at next visit.    Rehab Potential Good    PT Frequency 2x / week    PT Duration 12 weeks    PT Treatment/Interventions Iontophoresis 4mg /ml Dexamethasone;Taping;Vasopneumatic Device;Patient/family education;Moist Heat;Therapeutic activities;Passive range of motion;Dry needling;Therapeutic exercise;Cryotherapy;Electrical Stimulation;Neuromuscular re-education;Manual techniques    PT Next Visit Plan continue with Phase IV strengthening - working on shoulder flexion, abduction, ER - assess response to DN    PT Home Exercise Plan [id number] - patient will refer to previous exercises does not want reprinted exercises    Consulted and Agree with Plan of Care Patient             Patient will benefit from skilled therapeutic intervention in order to improve the following deficits and impairments:     Visit Diagnosis: Stiffness of right shoulder joint  Muscle weakness (generalized)  Chronic right shoulder pain     Problem List Patient Active Problem List   Diagnosis Date Noted   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 04/04/2020   Primary osteoarthritis involving multiple joints 07/26/2018   Acute left ankle pain 07/26/2018   Cerumen debris on tympanic membrane of right ear 07/26/2018   Tinea pedis 07/21/2018   Periscapular pain 07/20/2018   Retrocalcaneal bursitis, left 07/20/2018   Hepatitis B core antibody positive 07/20/2018   Polyarthralgia 07/20/2018   At risk for obstructive sleep apnea 07/20/2018   Hearing loss due to cerumen impaction, right 11/02/2017   Seasonal allergic rhinitis due to pollen 11/02/2017   Fasting hyperglycemia 08/06/2017   Primary osteoarthritis of right hip 08/06/2017   Chronic  right-sided low back pain without sciatica 08/06/2017   Hypertriglyceridemia 08/06/2017   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   Gastroesophageal reflux disease without esophagitis 07/09/2017   Hypertension goal BP (blood pressure) < 130/80 07/09/2017   Class 2 obesity due to excess calories in adult 07/09/2017    Adrian [name] 07/11/2017, PT, MPH  03/19/2021, 11:12 AM  [facility] [address] [place], [place], [place] Phone: [phone]   Fax:  [phone]  Name: Adrian [name] MRN: Frankey Poot Date of Birth: [birth year]

## 2021-03-26 ENCOUNTER — Ambulatory Visit: Payer: BC Managed Care – PPO | Admitting: Rehabilitative and Restorative Service Providers"

## 2021-03-26 ENCOUNTER — Other Ambulatory Visit: Payer: Self-pay

## 2021-03-26 ENCOUNTER — Encounter: Payer: Self-pay | Admitting: Rehabilitative and Restorative Service Providers"

## 2021-03-26 DIAGNOSIS — G8929 Other chronic pain: Secondary | ICD-10-CM

## 2021-03-26 DIAGNOSIS — M6281 Muscle weakness (generalized): Secondary | ICD-10-CM | POA: Diagnosis not present

## 2021-03-26 DIAGNOSIS — M25511 Pain in right shoulder: Secondary | ICD-10-CM

## 2021-03-26 DIAGNOSIS — M25611 Stiffness of right shoulder, not elsewhere classified: Secondary | ICD-10-CM | POA: Diagnosis not present

## 2021-03-26 DIAGNOSIS — R600 Localized edema: Secondary | ICD-10-CM

## 2021-03-26 NOTE — Therapy (Addendum)
[facility] [place], [place], [address] Phone: [phone]   Fax:  [phone]  Physical Therapy Treatment and Discharge Summary   PHYSICAL THERAPY DISCHARGE SUMMARY  Visits from Start of Care: 26  Current functional level related to goals / functional outcomes: See progress note    Remaining deficits: Needs to continue with stretching and strengthening    Education / Equipment: HEP    Patient agrees to discharge. Patient goals were met. Patient is being discharged due to meeting the stated rehab goals.   Di Jasmer P. Helene Kelp PT, MPH 03/26/21 12:40 PM   Patient Details  Name: Adrian [name] MRN: [id number] Date of Birth: 06-29-1960 Referring Provider (PT): Dr Ophelia Charter   Encounter Date: 03/26/2021   PT End of Session - 03/26/21 1153     Visit Number 26    Number of Visits 28    Date for PT Re-Evaluation 03/26/21    Authorization Type BCBS    PT Start Time 5956    PT Stop Time 1233   [place] end of treatment   PT Time Calculation (min) 48 min    Activity Tolerance Patient tolerated treatment well             Past Medical History:  Diagnosis Date   Chronic right hip pain 08/06/2017   Chronic right-sided low back pain without sciatica 08/06/2017   GERD (gastroesophageal reflux disease)    Hypertension    Hypertriglyceridemia 08/06/2017   Seasonal allergies     Past Surgical History:  Procedure Laterality Date   ESOPHAGOGASTRODUODENOSCOPY     NO PAST SURGERIES     SHOULDER ARTHROSCOPY WITH SUBACROMIAL DECOMPRESSION AND BICEP TENDON REPAIR Left 05/31/2020   Procedure: LEFT SHOULDER ARTHROSCOPY WITH DEBRIDEMENT, DISTAL CLAVICAL EXCISION,  SUBACROMIAL DECOMPRESSION, BICEPS TENODESIS;  Surgeon: Hiram Gash, MD;  Location: [facility];  Service: Orthopedics;  Laterality: Left;    There were no vitals filed for this visit.   Subjective Assessment - 03/26/21 1153     Subjective  Patient reports that the Rt shoulder is still weaker but not like it was. Can now lift the tea pitcher; shampoo hair; put his shirt on easier with the Rt UE. He has been doing the exercises at home. Concerned with his carpal tunnel symptoms on the Lt > Rt. Confident with HEP.    Currently in Pain? No/denies    Pain Score 0-No pain    Pain Orientation Right                [facility] PT Assessment - 03/26/21 0001       Assessment   Medical Diagnosis Rt/Lt RTC repair with bicep tendodesis and DCR    Referring Provider (PT) Dr Ophelia Charter    Onset Date/Surgical Date 10/08/20    Hand Dominance Right    Next MD Visit PRN    Prior Therapy here for Lt shoulder      Observation/Other Assessments   Focus on Therapeutic Outcomes (FOTO)  72      AROM   Right Shoulder Extension 69 Degrees    Right Shoulder Flexion 147 Degrees    Right Shoulder ABduction 152 Degrees    Right Shoulder Internal Rotation --   thumb T10   Right Shoulder External Rotation 90 Degrees   shoulder 90 deg abd/elbow 90 deg flex   Left Shoulder Extension 67 Degrees    Left Shoulder Flexion 155 Degrees    Left Shoulder ABduction 154  Degrees    Left Shoulder Internal Rotation --   thumb T8   Left Shoulder External Rotation 90 Degrees   shoulder 90 deg abd/elbow 90 deg     Strength   Right Shoulder Flexion 5/5    Right Shoulder Extension 5/5    Right Shoulder ABduction 5/5    Right Shoulder Internal Rotation 5/5    Right Shoulder External Rotation 5/5    Right Shoulder Horizontal ABduction 5/5    Left Shoulder Flexion 5/5    Left Shoulder Extension 5/5    Left Shoulder ABduction 5/5    Left Shoulder Internal Rotation 5/5    Left Shoulder External Rotation 5/5    Left Shoulder Horizontal ABduction 5/5      Palpation   Palpation comment muscular tightness Rt shoulder girdle - continued tightness through anterior shoulder in biceps and supraspinitus tendons as well as through pecs, upper trap, leveator; deltoid                            [facility] Adult PT Treatment/Exercise - 03/26/21 0001       Exercises   Exercises Shoulder   review of HEP for stretching and strengthening     Shoulder Exercises: Supine   Other Supine Exercises neural mobilization 1 min x 2 reps each UE; cervical chin tuck, lateral flexion and rotation x 3 each side      Shoulder Exercises: ROM/Strengthening   UBE (Upper Arm Bike) L8 x 4 min 2 min fwd/2 min back      Shoulder Exercises: Stretch   Other Shoulder Stretches Rt/Lt bicep stretch x 20 sec x 2 reps.    Other Shoulder Stretches middle and high doorway stretch x 15 sec x 2 reps each;  prayer stretch x 30 sec      Manual Therapy   Manual therapy comments skilled palpation to assess reponse to DN and manual work    Joint Mobilization circumduction of Rt [place]c joint with distraction to encourage muscle relaxation    Soft tissue mobilization Lt/Rt pecs; upper trap; biceps              Trigger Point Dry Needling - 03/26/21 0001     Consent Given? Yes    Education Handout Provided Previously provided    Electrical Stimulation Performed with Dry Needling Yes    Pectoralis Major Response Palpable increased muscle length;Twitch response elicited    Pectoralis Minor Response Palpable increased muscle length    Deltoid Response Palpable increased muscle length;Twitch response elicited    Biceps Response Palpable increased muscle length;Twitch response elicited                     PT Short Term Goals - 03/26/21 1203       PT SHORT TERM GOAL #3   Title Independent in postural correction    Time 6    Period Weeks    Status Achieved      PT SHORT TERM GOAL #4   Title Independent in self dressing and grooming    Time 6    Period Weeks    Status Achieved               PT Long Term Goals - 03/26/21 1202       PT LONG TERM GOAL #1   Title independent with initial HEP    Time 6    Period Weeks    Status Achieved  PT LONG TERM  GOAL #2   Title demo Rt shoulder strength =/> 4+/5 to5/5 to allow initial return to activities at work and in his yard    Time 6    Period Weeks    Status Achieved      PT LONG TERM GOAL #3   Title Rt shoulder ROM WFL's and painfree    Time 12    Period Weeks    Status Achieved      PT LONG TERM GOAL #4   Title patient reports ability to rest and sleep with minimal to no pain in Rt shoulder    Time 12    Period Weeks    Status Achieved      PT LONG TERM GOAL #5   Title Independent in all strengthening exercises and activities prior to d/c    Time 6    Period Weeks    Status Achieved                   Plan - 03/26/21 1200     Clinical Impression Statement Goals of therapy have been accomplished. Patient has returned to most normal funcitonal activities. Patient has a variety of exercises to continue at home. Patient will call with any concerns or questions.    Rehab Potential Good    PT Frequency 2x / week    PT Duration 12 weeks    PT Treatment/Interventions Iontophoresis 56m/ml Dexamethasone;Taping;Vasopneumatic Device;Patient/family education;Moist Heat;Therapeutic activities;Passive range of motion;Dry needling;Therapeutic exercise;Cryotherapy;Electrical Stimulation;Neuromuscular re-education;Manual techniques    PT Next Visit Plan continue with independent HEP    PT Home Exercise Plan [id number] - patient will refer to previous exercises does not want reprinted exercises    Consulted and Agree with Plan of Care Patient             Patient will benefit from skilled therapeutic intervention in order to improve the following deficits and impairments:     Visit Diagnosis: Stiffness of right shoulder joint  Muscle weakness (generalized)  Chronic right shoulder pain  Localized edema     Problem List Patient Active Problem List   Diagnosis Date Noted   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 04/04/2020   Primary osteoarthritis involving  multiple joints 07/26/2018   Acute left ankle pain 07/26/2018   Cerumen debris on tympanic membrane of right ear 07/26/2018   Tinea pedis 07/21/2018   Periscapular pain 07/20/2018   Retrocalcaneal bursitis, left 07/20/2018   Hepatitis B core antibody positive 07/20/2018   Polyarthralgia 07/20/2018   At risk for obstructive sleep apnea 07/20/2018   Hearing loss due to cerumen impaction, right 11/02/2017   Seasonal allergic rhinitis due to pollen 11/02/2017   Fasting hyperglycemia 08/06/2017   Primary osteoarthritis of right hip 08/06/2017   Chronic right-sided low back pain without sciatica 08/06/2017   Hypertriglyceridemia 08/06/2017   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   Gastroesophageal reflux disease without esophagitis 07/09/2017   Hypertension goal BP (blood pressure) < 130/80 07/09/2017   Class 2 obesity due to excess calories in adult 07/09/2017    [place] PT, MPH  03/26/2021, 12:39 PM  [id number] [place] [address]Phone: [phone]  Fax:  [phone] Name: Adrian KARENMRN: [id number] of Birth: [birth year]

## 2021-04-04 ENCOUNTER — Ambulatory Visit (INDEPENDENT_AMBULATORY_CARE_PROVIDER_SITE_OTHER): Payer: BC Managed Care – PPO | Admitting: Sports Medicine

## 2021-04-04 ENCOUNTER — Other Ambulatory Visit: Payer: Self-pay

## 2021-04-04 VITALS — BP 136/65 | HR 63

## 2021-04-04 DIAGNOSIS — R030 Elevated blood-pressure reading, without diagnosis of hypertension: Secondary | ICD-10-CM

## 2021-04-04 DIAGNOSIS — Z23 Encounter for immunization: Secondary | ICD-10-CM | POA: Diagnosis not present

## 2021-04-04 NOTE — Progress Notes (Signed)
Pt in for BP check.  136/65 today.

## 2021-04-14 ENCOUNTER — Other Ambulatory Visit: Payer: Self-pay | Admitting: Osteopathic Medicine

## 2021-07-11 ENCOUNTER — Other Ambulatory Visit: Payer: Self-pay | Admitting: Family Medicine

## 2021-07-11 NOTE — Telephone Encounter (Signed)
LVM for patient to call back to get a TOC visit scheduled. AM ?

## 2021-07-11 NOTE — Telephone Encounter (Signed)
Pls contact pt to establish with PCP for additional refills. Medication was refilled initially due to [facility] being prior PCP.

## 2021-09-17 ENCOUNTER — Emergency Department
Admission: EM | Admit: 2021-09-17 | Discharge: 2021-09-17 | Disposition: A | Discharge status: Home / Self Care | Payer: BC Managed Care – PPO | Source: Home / Self Care | Attending: Family Medicine | Admitting: Family Medicine

## 2021-09-17 DIAGNOSIS — J069 Acute upper respiratory infection, unspecified: Secondary | ICD-10-CM

## 2021-09-17 DIAGNOSIS — J302 Other seasonal allergic rhinitis: Secondary | ICD-10-CM

## 2021-09-17 LAB — POC SARS CORONAVIRUS 2 AG -  ED: SARS Coronavirus 2 Ag: NEGATIVE

## 2021-09-17 MED ORDER — PREDNISONE 20 MG PO TABS: ORAL_TABLET | ORAL | 0 refills | Status: DC

## 2021-09-17 MED ORDER — AZITHROMYCIN 250 MG PO TABS: ORAL_TABLET | ORAL | 0 refills | Status: DC

## 2021-09-17 NOTE — Discharge Instructions (Signed)
Take plain guaifenesin (1200mg  extended release tabs such as Mucinex) twice daily, with plenty of water, for cough and congestion.  Get adequate rest.   ?May use Afrin nasal spray (or generic oxymetazoline) each morning for about 5 days and then discontinue.  Also recommend using saline nasal spray several times daily and saline nasal irrigation (AYR is a common brand).  Use Flonase nasal spray each morning after using Afrin nasal spray and saline nasal irrigation. ?Try warm salt water gargles for sore throat.  ?Stop all antihistamines (loratadine, Nyquil) for now, and other non-prescription cough/cold preparations. ?May take Delsym Cough Suppressant ("12 Hour Cough Relief") at bedtime for nighttime cough.  ?

## 2021-09-17 NOTE — ED Triage Notes (Signed)
Pt c/o cough and congestion since Thurs evening. Taking rx loratadine prn. Denies fever. At home covid neg on Saturday. ?

## 2021-09-17 NOTE — ED Provider Notes (Signed)
?[facility] ? ? ? ?CSN: [id number] ?Arrival date & time: 09/17/21  1203 ? ? ?  ? ?History   ?Chief Complaint ?Chief Complaint  ?Patient presents with  ? Cough  ? Nasal Congestion  ? ? ?HPI ?Adrian [name] is a 61 y.o. male.  ? ?About five days ago developed fatigue, rhinorrhea, and sinus congestion.  The next day he developed a non-productive cough that has persisted.  He has had intermittent sweats/chills and today fever 99.  He has felt fullness in his right ear.   ?He has seasonal allergies and a past history of pneumonia.  Family history of asthma in two sisters and his father.  He notes that his URI's tend to linger. ?Patient had a negative home COVID19 test three days ago. ? ? ?The history is provided by the patient.  ? ?Past Medical History:  ?Diagnosis Date  ? Chronic right hip pain 08/06/2017  ? Chronic right-sided low back pain without sciatica 08/06/2017  ? GERD (gastroesophageal reflux disease)   ? Hypertension   ? Hypertriglyceridemia 08/06/2017  ? Seasonal allergies   ? ? ?Patient Active Problem List  ? Diagnosis Date Noted  ? 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 Primary osteoarthritis involving multiple joints 07/26/2018  ? Acute left ankle pain 07/26/2018  ? Cerumen debris on tympanic membrane of right ear 07/26/2018  ? Tinea pedis 07/21/2018  ? Periscapular pain 07/20/2018  ? Retrocalcaneal bursitis, left 07/20/2018  ? Hepatitis B core antibody positive 07/20/2018  ? Polyarthralgia 07/20/2018  ? At risk for obstructive sleep apnea 07/20/2018  ? Hearing loss due to cerumen impaction, right 11/02/2017  ? Seasonal allergic rhinitis due to pollen 11/02/2017  ? Fasting hyperglycemia 08/06/2017  ? Primary osteoarthritis of right hip 08/06/2017  ? Chronic right-sided low back pain without sciatica 08/06/2017  ? Hypertriglyceridemia 08/06/2017  ? 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  ? Gastroesophageal reflux disease without esophagitis 07/09/2017  ? Hypertension goal BP  (blood pressure) < 130/80 07/09/2017  ? Class 2 obesity due to excess calories in adult 07/09/2017  ? ? ?Past Surgical History:  ?Procedure Laterality Date  ? ESOPHAGOGASTRODUODENOSCOPY    ? NO PAST SURGERIES    ? SHOULDER ARTHROSCOPY WITH SUBACROMIAL DECOMPRESSION AND BICEP TENDON REPAIR Left 05/31/2020  ? Procedure: LEFT SHOULDER ARTHROSCOPY WITH DEBRIDEMENT, DISTAL CLAVICAL EXCISION,  SUBACROMIAL DECOMPRESSION, BICEPS TENODESIS;  Surgeon: Bjorn PippinVarkey, Dax T, MD;  Location: [facility];  Service: Orthopedics;  Laterality: Left;  ? ? ? ? ? ?Home Medications   ? ?Prior to Admission medications   ?Medication Sig Start Date End Date Taking? Authorizing Provider  ?azithromycin (ZITHROMAX Z-PAK) 250 MG tablet Take 2 tabs today; then begin one tab once daily for 4 more days. 09/17/21  Yes Lattie HawBeese, Kealani Leckey A, MD  ?predniSONE (DELTASONE) 20 MG tablet Take one tab by mouth twice daily for 4 days, then one daily. Take with food. 09/17/21  Yes Lattie HawBeese, Jaymi Tinner A, MD  ?aspirin EC 81 MG tablet Take 1 tablet (81 mg total) by mouth daily. 07/09/17   Carlis Stableummings, Charley Elizabeth, PA-C  ?celecoxib (CELEBREX) 200 MG capsule Take 1 capsule (200 mg total) by mouth daily. Need to establish with new PCP for future refills. 04/16/21   Everrett CoombeMatthews, Cody, DO  ?famotidine (PEPCID) 20 MG tablet Take 1-2 tablets (20-40 mg total) by mouth daily. 10/02/20   Sunnie NielsenAlexander, Natalie, DO  ?fluticasone (FLONASE) 50 MCG/ACT nasal spray Place 1 spray into both nostrils daily. 10/02/20   Lyn HollingsheadAlexander,  Natalie, DO  ?hydrochlorothiazide (HYDRODIURIL) 12.5 MG tablet Take 1 tablet (12.5 mg total) by mouth daily. 10/02/20   Sunnie Nielsen, DO  ?loratadine (CLARITIN) 10 MG tablet Take 1 tablet (10 mg total) by mouth daily. 10/02/20   Sunnie Nielsen, DO  ? ? ?Family History ?Family History  ?Problem Relation Age of Onset  ? Dementia Mother   ? Stroke Father   ? COPD Father   ? Heart failure Father   ? Asthma Father   ? Asthma Sister   ? Heart attack Paternal Uncle    ? Stroke Paternal Uncle   ? ? ?Social History ?Social History  ? ?Tobacco Use  ? Smoking status: Never  ? Smokeless tobacco: Never  ?Vaping Use  ? Vaping Use: Never used  ?Substance Use Topics  ? Alcohol use: No  ? Drug use: No  ? ? ? ?Allergies   ?Patient has no known allergies. ? ? ?Review of Systems ?Review of Systems ?No sore throat ?+ cough ?No pleuritic pain ?No wheezing ?+ nasal congestion ?+ post-nasal drainage ?? sinus pain/pressure ?No itchy/red eyes ?? Right earache ?No hemoptysis ?No SOB ?+ fever, + chills/sweats ?No nausea ?No vomiting ?No abdominal pain ?No diarrhea ?No urinary symptoms ?No skin rash ?+ fatigue ?No myalgias ?No headache ?Used OTC meds (Nyquil, loratidine) without relief  ? ?Physical Exam ?Triage Vital Signs ?ED Triage Vitals  ?Enc Vitals Group  ?   BP 09/17/21 1212 (!) 142/88  ?   Pulse Rate 09/17/21 1212 75  ?   Resp 09/17/21 1212 18  ?   Temp 09/17/21 1212 98.8 ?F (37.1 ?C)  ?   Temp Source 09/17/21 1212 Oral  ?   SpO2 09/17/21 1212 97 %  ?   Weight --   ?   Height --   ?   Head Circumference --   ?   Peak Flow --   ?   Pain Score 09/17/21 1214 0  ?   Pain Loc --   ?   Pain Edu? --   ?   Excl. in GC? --   ? ?No data found. ? ?Updated Vital Signs ?BP (!) 142/88 (BP Location: Right Arm)   Pulse 75   Temp 98.8 ?F (37.1 ?C) (Oral)   Resp 18   SpO2 97%  ? ?Visual Acuity ?Right Eye Distance:   ?Left Eye Distance:   ?Bilateral Distance:   ? ?Right Eye Near:   ?Left Eye Near:    ?Bilateral Near:    ? ?Physical Exam ?Nursing notes and Vital Signs reviewed. ?Appearance:  Patient appears stated age, and in no acute distress ?Eyes:  Pupils are equal, round, and reactive to light and accomodation.  Extraocular movement is intact.  Conjunctivae are not inflamed  ?Ears:  Canals normal.  Tympanic membranes normal.  ?Nose:  Congested turbinates.  No sinus tenderness.  ?Pharynx:  Normal ?Neck:  Supple.  Mildly enlarged lateral nodes are present, tender to palpation on the left.   ?Lungs:  Clear  to auscultation.  Breath sounds are equal.  Moving air well. ?Heart:  Regular rate and rhythm without murmurs, rubs, or gallops.  ?Abdomen:  Nontender without masses or hepatosplenomegaly.  Bowel sounds are present.  No CVA or flank tenderness.  ?Extremities:  No edema.  ?Skin:  No rash present.  ? ?UC Treatments / Results  ?Labs ?(all labs ordered are listed, but only abnormal results are displayed) ?Labs Reviewed  ?POC SARS CORONAVIRUS 2 AG -  ED negative  ? ? ?  EKG ? ? ?Radiology ?No results found. ? ?Procedures ?Procedures (including critical care time) ? ?Medications Ordered in UC ?Medications - No data to display ? ?Initial Impression / Assessment and Plan / UC Course  ?I have reviewed the triage vital signs and the nursing notes. ? ?Pertinent labs & imaging results that were available during my care of the patient were reviewed by me and considered in my medical decision making (see chart for details). ? ?  ?Suspect mild reactive airways disease that only manifests during a URI. ??bronchitis.  Begin Z-pack and prednisone burst/taper. ?Followup with Family Doctor if not improved in one week.  ? ?Final Clinical Impressions(s) / UC Diagnoses  ? ?Final diagnoses:  ?Viral URI with cough  ?Seasonal allergies  ? ? ? ?Discharge Instructions   ? ?  ?Take plain guaifenesin (1200mg  extended release tabs such as Mucinex) twice daily, with plenty of water, for cough and congestion.  Get adequate rest.   ?May use Afrin nasal spray (or generic oxymetazoline) each morning for about 5 days and then discontinue.  Also recommend using saline nasal spray several times daily and saline nasal irrigation (AYR is a common brand).  Use Flonase nasal spray each morning after using Afrin nasal spray and saline nasal irrigation. ?Try warm salt water gargles for sore throat.  ?Stop all antihistamines (loratadine, Nyquil) for now, and other non-prescription cough/cold preparations. ?May take Delsym Cough Suppressant ("12 Hour Cough  Relief") at bedtime for nighttime cough.  ? ? ? ?ED Prescriptions   ? ? Medication Sig Dispense Auth. Provider  ? predniSONE (DELTASONE) 20 MG tablet Take one tab by mouth twice daily for 4 days, then one daily. Take

## 2021-12-03 ENCOUNTER — Telehealth: Payer: Self-pay | Admitting: Family Medicine

## 2021-12-03 DIAGNOSIS — I10 Essential (primary) hypertension: Secondary | ICD-10-CM

## 2021-12-03 DIAGNOSIS — R600 Localized edema: Secondary | ICD-10-CM

## 2021-12-03 NOTE — Telephone Encounter (Signed)
Pt called. He is requesting refill on HCTZ, loratadine, famatodine. He has a transfer of care with Dr Ashley Royalty appointment on September 28.

## 2021-12-04 MED ORDER — HYDROCHLOROTHIAZIDE 12.5 MG PO TABS: 12.5000 mg | ORAL_TABLET | Freq: Every day | ORAL | 0 refills | Status: DC

## 2021-12-04 MED ORDER — FAMOTIDINE 20 MG PO TABS: 20.0000 mg | ORAL_TABLET | Freq: Every day | ORAL | 0 refills | Status: DC

## 2021-12-04 MED ORDER — LORATADINE 10 MG PO TABS: 10.0000 mg | ORAL_TABLET | Freq: Every day | ORAL | 0 refills | Status: DC

## 2021-12-04 NOTE — Telephone Encounter (Signed)
Refills sent

## 2022-01-17 DIAGNOSIS — S0502XD Injury of conjunctiva and corneal abrasion without foreign body, left eye, subsequent encounter: Secondary | ICD-10-CM | POA: Diagnosis not present

## 2022-02-20 ENCOUNTER — Ambulatory Visit: Payer: BC Managed Care – PPO | Admitting: Family Medicine

## 2022-02-20 ENCOUNTER — Encounter: Payer: Self-pay | Admitting: Family Medicine

## 2022-02-20 VITALS — BP 130/76 | HR 66 | Ht 67.0 in | Wt 226.0 lb

## 2022-02-20 DIAGNOSIS — M7660 Achilles tendinitis, unspecified leg: Secondary | ICD-10-CM

## 2022-02-20 DIAGNOSIS — I10 Essential (primary) hypertension: Secondary | ICD-10-CM

## 2022-02-20 DIAGNOSIS — J301 Allergic rhinitis due to pollen: Secondary | ICD-10-CM

## 2022-02-20 DIAGNOSIS — R011 Cardiac murmur, unspecified: Secondary | ICD-10-CM

## 2022-02-20 DIAGNOSIS — R600 Localized edema: Secondary | ICD-10-CM | POA: Diagnosis not present

## 2022-02-20 DIAGNOSIS — Z23 Encounter for immunization: Secondary | ICD-10-CM

## 2022-02-20 DIAGNOSIS — Z125 Encounter for screening for malignant neoplasm of prostate: Secondary | ICD-10-CM

## 2022-02-20 DIAGNOSIS — E781 Pure hyperglyceridemia: Secondary | ICD-10-CM | POA: Diagnosis not present

## 2022-02-20 MED ORDER — HYDROCHLOROTHIAZIDE 12.5 MG PO TABS: 12.5000 mg | ORAL_TABLET | Freq: Every day | ORAL | 0 refills | Status: DC

## 2022-02-20 MED ORDER — FAMOTIDINE 20 MG PO TABS: 20.0000 mg | ORAL_TABLET | Freq: Every day | ORAL | 0 refills | Status: DC

## 2022-02-20 MED ORDER — FLUTICASONE PROPIONATE 50 MCG/ACT NA SUSP: 1.0000 | Freq: Every day | NASAL | 3 refills | Status: DC

## 2022-02-20 MED ORDER — NITROGLYCERIN 0.2 MG/HR TD PT24: MEDICATED_PATCH | TRANSDERMAL | 0 refills | Status: DC

## 2022-02-20 MED ORDER — LORATADINE 10 MG PO TABS: 10.0000 mg | ORAL_TABLET | Freq: Every day | ORAL | 0 refills | Status: DC

## 2022-02-20 NOTE — Progress Notes (Signed)
Adrian [name] - 61 y.o. male MRN [id number]  Date of birth: 08/16/1960  Subjective Chief Complaint  Patient presents with   Hypertension    HPI Adrian [name] is a 61-year-old male here today for follow-up visit.  This is his first visit with me, previous patient of Dr. Alexander.  Has history of hypertension that is currently managed with hydrochlorothiazide.  He is doing well with this without side effects.  Denies symptoms related to hypertension including chest pain, shortness of breath, palpitations, headaches or vision changes.  Additionally he has had some pain in his Achilles.  No known overuse.  Has not tried anything for treatment of this.  Remains on famotidine for management of reflux.  Stable at this time.  ROS:  A comprehensive ROS was completed and negative except as noted per HPI  No Known Allergies  Past Medical History:  Diagnosis Date   Chronic right hip pain 08/06/2017   Chronic right-sided low back pain without sciatica 08/06/2017   GERD (gastroesophageal reflux disease)    Hypertension    Hypertriglyceridemia 08/06/2017   Seasonal allergies     Past Surgical History:  Procedure Laterality Date   ESOPHAGOGASTRODUODENOSCOPY     NO PAST SURGERIES     SHOULDER ARTHROSCOPY WITH SUBACROMIAL DECOMPRESSION AND BICEP TENDON REPAIR Left 05/31/2020   Procedure: LEFT SHOULDER ARTHROSCOPY WITH DEBRIDEMENT, DISTAL CLAVICAL EXCISION,  SUBACROMIAL DECOMPRESSION, BICEPS TENODESIS;  Surgeon: Varkey, Dax T, MD;  Location: [facility];  Service: Orthopedics;  Laterality: Left;    Social History   Socioeconomic History   Marital status: Married    Spouse name: Not on file   Number of children: Not on file   Years of education: Not on file   Highest education level: Not on file  Occupational History   Not on file  Tobacco Use   Smoking status: Never   Smokeless tobacco: Never  Vaping Use   Vaping Use: Never used  Substance and Sexual Activity   Alcohol use:  No   Drug use: No   Sexual activity: Yes    Birth control/protection: None  Other Topics Concern   Not on file  Social History Narrative   Not on file   Social Determinants of Health   Financial Resource Strain: Not on file  Food Insecurity: Not on file  Transportation Needs: Not on file  Physical Activity: Not on file  Stress: Not on file  Social Connections: Not on file    Family History  Problem Relation Age of Onset   Dementia Mother    Stroke Father    COPD Father    Heart failure Father    Asthma Father    Asthma Sister    Heart attack Paternal Uncle    Stroke Paternal Uncle     Health Maintenance  Topic Date Due   COVID-19 Vaccine (3 - Pfizer series) 12/09/2019   Zoster Vaccines- Shingrix (2 of 2) 04/17/2022   COLONOSCOPY (Pts 45-49yrs Insurance coverage will need to be confirmed)  03/12/2023   TETANUS/TDAP  06/26/2024   INFLUENZA VACCINE  Completed   Hepatitis C Screening  Completed   HIV Screening  Completed   HPV VACCINES  Aged Out     ----------------------------------------------------------------------------------------------------------------------------------------------------------------------------------------------------------------- Physical Exam BP 130/76 (BP Location: Left Arm, Patient Position: Sitting, Cuff Size: Large)   Pulse 66   Ht 5' 7" (1.702 m)   Wt 226 lb (102.5 kg)   SpO2 96%   BMI 35.40 kg/m   Physical Exam Constitutional:        Appearance: Normal appearance.  Eyes:     General: No scleral icterus. Cardiovascular:     Rate and Rhythm: Normal rate and regular rhythm.     Heart sounds: Murmur (2-3/6 systolci murmur) heard.  Pulmonary:     Effort: Pulmonary effort is normal.     Breath sounds: Normal breath sounds.  Musculoskeletal:     Cervical back: Neck supple.     Comments: Tenderness of Achilles at the calcaneus.  Neurological:     General: No focal deficit present.     Mental Status: He is alert.  Psychiatric:         Mood and Affect: Mood normal.        Behavior: Behavior normal.     ------------------------------------------------------------------------------------------------------------------------------------------------------------------------------------------------------------------- Assessment and Plan  Hypertension goal BP (blood pressure) < 130/80 Blood pressure remains fairly well controlled at this time.  We will continue hydrochlorothiazide at current strength.  Updated labs ordered today.   Achilles tendinitis Given heel lifts to use in shoe.  Recommend use of one quarter nitroglycerin patch daily.  Discontinue use if having headache.  Systolic murmur Echocardiogram ordered for further evaluation.   Meds ordered this encounter  Medications   nitroGLYCERIN (NITRODUR - DOSED IN MG/24 HR) 0.2 mg/hr patch    Sig: Apply 1/4 of patch to achilles daily.    Dispense:  30 patch    Refill:  0   hydrochlorothiazide (HYDRODIURIL) 12.5 MG tablet    Sig: Take 1 tablet (12.5 mg total) by mouth daily.    Dispense:  90 tablet    Refill:  0   famotidine (PEPCID) 20 MG tablet    Sig: Take 1-2 tablets (20-40 mg total) by mouth daily.    Dispense:  180 tablet    Refill:  0   fluticasone (FLONASE) 50 MCG/ACT nasal spray    Sig: Place 1 spray into both nostrils daily.    Dispense:  48 g    Refill:  3   loratadine (CLARITIN) 10 MG tablet    Sig: Take 1 tablet (10 mg total) by mouth daily.    Dispense:  90 tablet    Refill:  0    Return in about 6 months (around 08/21/2022) for HTN.    This visit occurred during the SARS-CoV-2 public health emergency.  Safety protocols were in place, including screening questions prior to the visit, additional usage of staff PPE, and extensive cleaning of exam room while observing appropriate contact time as indicated for disinfecting solutions.

## 2022-02-20 NOTE — Patient Instructions (Signed)
Great to meet you today! Have labs completed when fasting.  Try nitro patch and heel lift for achilles pain.  See me again in 6 months.

## 2022-02-23 DIAGNOSIS — M7660 Achilles tendinitis, unspecified leg: Secondary | ICD-10-CM | POA: Insufficient documentation

## 2022-02-23 DIAGNOSIS — R011 Cardiac murmur, unspecified: Secondary | ICD-10-CM | POA: Insufficient documentation

## 2022-02-23 NOTE — Assessment & Plan Note (Signed)
Blood pressure remains fairly well controlled at this time.  We will continue hydrochlorothiazide at current strength.  Updated labs ordered today.

## 2022-02-23 NOTE — Progress Notes (Deleted)
Adrian [name] - 61 y.o. male MRN [id number]  Date of birth: Apr 11, 1961  Subjective Chief Complaint  Patient presents with   Hypertension    HPI Adrian [name] is a 61 year old male here today for follow-up visit.  This is his first visit with me, previous patient of Dr. Sheppard Coil.  Has history of hypertension that is currently managed with hydrochlorothiazide.  He is doing well with this without side effects.  Denies symptoms related to hypertension including chest pain, shortness of breath, palpitations, headaches or vision changes.  Additionally he has had some pain in his Achilles.  No known overuse.  Has not tried anything for treatment of this.  Remains on famotidine for management of reflux.  Stable at this time.  ROS:  A comprehensive ROS was completed and negative except as noted per HPI  No Known Allergies  Past Medical History:  Diagnosis Date   Chronic right hip pain 08/06/2017   Chronic right-sided low back pain without sciatica 08/06/2017   GERD (gastroesophageal reflux disease)    Hypertension    Hypertriglyceridemia 08/06/2017   Seasonal allergies     Past Surgical History:  Procedure Laterality Date   ESOPHAGOGASTRODUODENOSCOPY     NO PAST SURGERIES     SHOULDER ARTHROSCOPY WITH SUBACROMIAL DECOMPRESSION AND BICEP TENDON REPAIR Left 05/31/2020   Procedure: LEFT SHOULDER ARTHROSCOPY WITH DEBRIDEMENT, DISTAL CLAVICAL EXCISION,  SUBACROMIAL DECOMPRESSION, BICEPS TENODESIS;  Surgeon: Hiram Gash, MD;  Location: [facility];  Service: Orthopedics;  Laterality: Left;    Social History   Socioeconomic History   Marital status: Married    Spouse name: Not on file   Number of children: Not on file   Years of education: Not on file   Highest education level: Not on file  Occupational History   Not on file  Tobacco Use   Smoking status: Never   Smokeless tobacco: Never  Vaping Use   Vaping Use: Never used  Substance and Sexual Activity   Alcohol use:  No   Drug use: No   Sexual activity: Yes    Birth control/protection: None  Other Topics Concern   Not on file  Social History Narrative   Not on file   Social Determinants of Health   Financial Resource Strain: Not on file  Food Insecurity: Not on file  Transportation Needs: Not on file  Physical Activity: Not on file  Stress: Not on file  Social Connections: Not on file    Family History  Problem Relation Age of Onset   Dementia Mother    Stroke Father    COPD Father    Heart failure Father    Asthma Father    Asthma Sister    Heart attack Paternal Uncle    Stroke Paternal Ayr Maintenance  Topic Date Due   COVID-19 Vaccine (3 - Pfizer series) 12/09/2019   Zoster Vaccines- Shingrix (2 of 2) 04/17/2022   COLONOSCOPY (Pts 45-60yrs Insurance coverage will need to be confirmed)  03/12/2023   TETANUS/TDAP  06/26/2024   INFLUENZA VACCINE  Completed   Hepatitis C Screening  Completed   HIV Screening  Completed   HPV VACCINES  Aged Out     ----------------------------------------------------------------------------------------------------------------------------------------------------------------------------------------------------------------- Physical Exam BP 130/76 (BP Location: Left Arm, Patient Position: Sitting, Cuff Size: Large)   Pulse 66   Ht 5\' 7"  (1.702 m)   Wt 226 lb (102.5 kg)   SpO2 96%   BMI 35.40 kg/m   Physical Exam Constitutional:  Appearance: Normal appearance.  Eyes:     General: No scleral icterus. Cardiovascular:     Rate and Rhythm: Normal rate and regular rhythm.     Heart sounds: Murmur (2-3/6 systolci murmur) heard.  Pulmonary:     Effort: Pulmonary effort is normal.     Breath sounds: Normal breath sounds.  Musculoskeletal:     Cervical back: Neck supple.     Comments: Tenderness of Achilles at the calcaneus.  Neurological:     General: No focal deficit present.     Mental Status: He is alert.  Psychiatric:         Mood and Affect: Mood normal.        Behavior: Behavior normal.     ------------------------------------------------------------------------------------------------------------------------------------------------------------------------------------------------------------------- Assessment and Plan  Hypertension goal BP (blood pressure) < 130/80 Blood pressure remains fairly well controlled at this time.  We will continue hydrochlorothiazide at current strength.  Updated labs ordered today.   Achilles tendinitis Given heel lifts to use in shoe.  Recommend use of one quarter nitroglycerin patch daily.  Discontinue use if having headache.  Systolic murmur Echocardiogram ordered for further evaluation.   Meds ordered this encounter  Medications   nitroGLYCERIN (NITRODUR - DOSED IN MG/24 HR) 0.2 mg/hr patch    Sig: Apply 1/4 of patch to achilles daily.    Dispense:  30 patch    Refill:  0   hydrochlorothiazide (HYDRODIURIL) 12.5 MG tablet    Sig: Take 1 tablet (12.5 mg total) by mouth daily.    Dispense:  90 tablet    Refill:  0   famotidine (PEPCID) 20 MG tablet    Sig: Take 1-2 tablets (20-40 mg total) by mouth daily.    Dispense:  180 tablet    Refill:  0   fluticasone (FLONASE) 50 MCG/ACT nasal spray    Sig: Place 1 spray into both nostrils daily.    Dispense:  48 g    Refill:  3   loratadine (CLARITIN) 10 MG tablet    Sig: Take 1 tablet (10 mg total) by mouth daily.    Dispense:  90 tablet    Refill:  0    Return in about 6 months (around 08/21/2022) for HTN.    This visit occurred during the SARS-CoV-2 public health emergency.  Safety protocols were in place, including screening questions prior to the visit, additional usage of staff PPE, and extensive cleaning of exam room while observing appropriate contact time as indicated for disinfecting solutions.

## 2022-02-23 NOTE — Assessment & Plan Note (Signed)
Given heel lifts to use in shoe.  Recommend use of one quarter nitroglycerin patch daily.  Discontinue use if having headache.

## 2022-02-23 NOTE — Assessment & Plan Note (Signed)
Echocardiogram ordered for further evaluation.

## 2022-03-10 ENCOUNTER — Ambulatory Visit (HOSPITAL_BASED_OUTPATIENT_CLINIC_OR_DEPARTMENT_OTHER)
Admission: RE | Admit: 2022-03-10 | Discharge: 2022-03-10 | Disposition: A | Discharge status: Home / Self Care | Payer: BC Managed Care – PPO | Source: Ambulatory Visit | Attending: Family Medicine | Admitting: Family Medicine

## 2022-03-10 DIAGNOSIS — R011 Cardiac murmur, unspecified: Secondary | ICD-10-CM | POA: Diagnosis not present

## 2022-03-10 NOTE — Progress Notes (Signed)
  Echocardiogram 2D Echocardiogram has been performed.  Merrie Roof F 03/10/2022, 11:52 AM

## 2022-03-11 LAB — ECHOCARDIOGRAM COMPLETE
AR max vel: 2.71 cm2
AV Area VTI: 2.53 cm2
AV Area mean vel: 2.55 cm2
AV Mean grad: 3 mmHg
AV Peak grad: 6.2 mmHg
Ao pk vel: 1.24 m/s
Area-P 1/2: 3.19 cm2
S' Lateral: 2.9 cm

## 2022-03-13 DIAGNOSIS — E781 Pure hyperglyceridemia: Secondary | ICD-10-CM | POA: Diagnosis not present

## 2022-03-13 DIAGNOSIS — Z125 Encounter for screening for malignant neoplasm of prostate: Secondary | ICD-10-CM | POA: Diagnosis not present

## 2022-03-13 DIAGNOSIS — I10 Essential (primary) hypertension: Secondary | ICD-10-CM | POA: Diagnosis not present

## 2022-03-14 LAB — COMPLETE METABOLIC PANEL WITH GFR
AG Ratio: 2.1 (calc) (ref 1.0–2.5)
ALT: 13 U/L (ref 9–46)
AST: 13 U/L (ref 10–35)
Albumin: 4.6 g/dL (ref 3.6–5.1)
Alkaline phosphatase (APISO): 61 U/L (ref 35–144)
BUN: 16 mg/dL (ref 7–25)
CO2: 29 mmol/L (ref 20–32)
Calcium: 9.7 mg/dL (ref 8.6–10.3)
Chloride: 103 mmol/L (ref 98–110)
Creat: 1.13 mg/dL (ref 0.70–1.30)
Globulin: 2.2 g/dL (calc) (ref 1.9–3.7)
Glucose, Bld: 100 mg/dL — ABNORMAL HIGH (ref 65–99)
Potassium: 4.4 mmol/L (ref 3.5–5.3)
Sodium: 139 mmol/L (ref 135–146)
Total Bilirubin: 1 mg/dL (ref 0.2–1.2)
Total Protein: 6.8 g/dL (ref 6.1–8.1)
eGFR: 75 mL/min/{1.73_m2} (ref >=60)

## 2022-03-14 LAB — LIPID PANEL W/REFLEX DIRECT LDL
Cholesterol: 159 mg/dL (ref <200)
HDL: 42 mg/dL (ref >=40)
LDL Cholesterol (Calc): 97 mg/dL (calc)
Non-HDL Cholesterol (Calc): 117 mg/dL (calc) (ref <130)
Total CHOL/HDL Ratio: 3.8 (calc) (ref <5.0)
Triglycerides: 102 mg/dL (ref <150)

## 2022-03-14 LAB — CBC WITH DIFFERENTIAL/PLATELET
Absolute Monocytes: 425 cells/uL (ref 200–950)
Basophils Absolute: 31 cells/uL (ref 0–200)
Basophils Relative: 0.8 %
Eosinophils Absolute: 70 cells/uL (ref 15–500)
Eosinophils Relative: 1.8 %
HCT: 44.3 % (ref 38.5–50.0)
Hemoglobin: 14.7 g/dL (ref 13.2–17.1)
Lymphs Abs: 1673 cells/uL (ref 850–3900)
MCH: 30.1 pg (ref 27.0–33.0)
MCHC: 33.2 g/dL (ref 32.0–36.0)
MCV: 90.6 fL (ref 80.0–100.0)
MPV: 10.4 fL (ref 7.5–12.5)
Monocytes Relative: 10.9 %
Neutro Abs: 1700 cells/uL (ref 1500–7800)
Neutrophils Relative %: 43.6 %
Platelets: 217 10*3/uL (ref 140–400)
RBC: 4.89 10*6/uL (ref 4.20–5.80)
RDW: 12.5 % (ref 11.0–15.0)
Total Lymphocyte: 42.9 %
WBC: 3.9 10*3/uL (ref 3.8–10.8)

## 2022-03-14 LAB — TSH: TSH: 1.02 mIU/L (ref 0.40–4.50)

## 2022-03-14 LAB — PSA: PSA: 1.47 ng/mL (ref <=4.00)

## 2022-06-13 ENCOUNTER — Ambulatory Visit
Admission: EM | Admit: 2022-06-13 | Discharge: 2022-06-13 | Disposition: A | Discharge status: Home / Self Care | Payer: BC Managed Care – PPO | Attending: Physician Assistant | Admitting: Physician Assistant

## 2022-06-13 ENCOUNTER — Encounter: Payer: Self-pay | Admitting: Emergency Medicine

## 2022-06-13 DIAGNOSIS — S0501XA Injury of conjunctiva and corneal abrasion without foreign body, right eye, initial encounter: Secondary | ICD-10-CM

## 2022-06-13 DIAGNOSIS — H1031 Unspecified acute conjunctivitis, right eye: Secondary | ICD-10-CM | POA: Diagnosis not present

## 2022-06-13 MED ORDER — CIPROFLOXACIN HCL 0.3 % OP SOLN: 1.0000 [drp] | OPHTHALMIC | 0 refills | Status: AC

## 2022-06-13 NOTE — ED Triage Notes (Signed)
Right eye pain under eyelid  started last night  Eyes were glued shut this morning Eyes red  Pt had a similar problem last week  Pt has contacts in now Pt had COVID on 05/26/22 Here w/ wife

## 2022-06-13 NOTE — ED Provider Notes (Signed)
[name]    CSN: [id number] Arrival date & time: 06/13/22  1136      History   Chief Complaint Chief Complaint  Patient presents with   Eye Pain    Right     HPI Adrian [name] is a 62 y.o. male.   Pt complains of right eye pain.  Pt reports he wears contacts.  Pt reports he has had a scratch and conjunctivitis in the past. Pt complains of drainage this am   The history is provided by the patient. No language interpreter was used.  Eye Pain This is a new problem. The problem occurs constantly. The problem has been gradually worsening. Nothing aggravates the symptoms. He has tried nothing for the symptoms.    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 04/04/2020   Primary osteoarthritis involving multiple joints 07/26/2018   Cerumen debris on tympanic membrane of right ear 07/26/2018   Periscapular pain 07/20/2018   Hepatitis B core antibody positive 07/20/2018   Polyarthralgia 07/20/2018   At risk for obstructive sleep apnea 07/20/2018   Hearing loss due to cerumen impaction, right 11/02/2017   Seasonal allergic rhinitis due to pollen 11/02/2017   Hypertriglyceridemia 08/06/2017   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   Gastroesophageal reflux disease without esophagitis 07/09/2017   Hypertension goal BP (blood pressure) < 130/80 07/09/2017   Class 2 obesity due to excess calories in adult 07/09/2017    Past Surgical History:  Procedure Laterality Date   ESOPHAGOGASTRODUODENOSCOPY     NO PAST SURGERIES     SHOULDER ARTHROSCOPY WITH SUBACROMIAL DECOMPRESSION AND BICEP TENDON  REPAIR Left 05/31/2020   Procedure: LEFT SHOULDER ARTHROSCOPY WITH DEBRIDEMENT, DISTAL CLAVICAL EXCISION,  SUBACROMIAL DECOMPRESSION, BICEPS TENODESIS;  Surgeon: Hiram Gash, MD;  Location: [facility];  Service: Orthopedics;  Laterality: Left;       Home Medications    Prior to Admission medications   Medication Sig Start Date End Date Taking? Authorizing Provider  ciprofloxacin (CILOXAN) 0.3 % ophthalmic solution Place 1 drop into both eyes every 2 (two) hours for 5 days. Administer 1 drop, every 2 hours, while awake, for 2 days. Then 1 drop, every 4 hours, while awake, for the next 5 days. 06/13/22 06/18/22 Yes Fransico Meadow, PA-C  aspirin EC 81 MG tablet Take 1 tablet (81 mg total) by mouth daily. 07/09/17   Trixie Dredge, PA-C  famotidine (PEPCID) 20 MG tablet Take 1-2 tablets (20-40 mg total) by mouth daily. 02/20/22   Luetta Nutting, DO  fluticasone (FLONASE) 50 MCG/ACT nasal spray Place 1 spray into both nostrils daily. Patient not taking: Reported on 06/13/2022 02/20/22   Luetta Nutting, DO  hydrochlorothiazide (HYDRODIURIL) 12.5 MG tablet Take 1 tablet (12.5 mg total) by mouth daily. 02/20/22   Luetta Nutting, DO  loratadine (CLARITIN) 10 MG tablet Take 1 tablet (10 mg total) by mouth daily. 02/20/22   Luetta Nutting, DO  nitroGLYCERIN (NITRODUR - DOSED IN MG/24 HR) 0.2 mg/hr patch Apply 1/4 of patch to achilles daily. Patient not taking: Reported on 06/13/2022 02/20/22   Luetta Nutting, DO  Family History Family History  Problem Relation Age of Onset   Dementia Mother    Stroke Father    COPD Father    Heart failure Father    Asthma Father    Asthma Sister    Heart attack Paternal Uncle    Stroke Paternal Uncle     Social History Social History   Tobacco Use   Smoking status: Never   Smokeless tobacco: Never  Vaping Use   Vaping Use: Never used  Substance Use Topics   Alcohol use: No   Drug use: No     Allergies   Patient has no known  allergies.   Review of Systems Review of Systems  Eyes:  Positive for pain.  All other systems reviewed and are negative.    Physical Exam Triage Vital Signs ED Triage Vitals  Enc Vitals Group     BP 06/13/22 1200 128/84     Pulse Rate 06/13/22 1200 70     Resp 06/13/22 1200 16     Temp 06/13/22 1200 99.5 F (37.5 C)     Temp Source 06/13/22 1200 Oral     SpO2 06/13/22 1200 95 %     Weight 06/13/22 1201 208 lb (94.3 kg)     Height 06/13/22 1201 5\' 7"  (1.702 m)     Head Circumference --      Peak Flow --      Pain Score 06/13/22 1200 4     Pain Loc --      Pain Edu? --      Excl. in [place]? --    No data found.  Updated Vital Signs BP 128/84 (BP Location: Left Arm)   Pulse 70   Temp 99.5 F (37.5 C) (Oral)   Resp 16   Ht 5\' 7"  (1.702 m)   Wt 94.3 kg   SpO2 95%   BMI 32.58 kg/m   Visual Acuity Right Eye Distance:   Left Eye Distance:   Bilateral Distance:    Right Eye Near:   Left Eye Near:    Bilateral Near:     Physical Exam Vitals and nursing note reviewed.  Constitutional:      Appearance: He is well-developed.  HENT:     Head: Normocephalic.  Eyes:     General:        Right eye: Discharge present.     Extraocular Movements: Extraocular movements intact.     Pupils: Pupils are equal, round, and reactive to light.     Comments: Fluroscein uptake 7 oclock.  Pinpoint areas around  no ulcer. No obvious fb  Pulmonary:     Effort: Pulmonary effort is normal.  Abdominal:     General: There is no distension.  Musculoskeletal:        General: Normal range of motion.     Cervical back: Normal range of motion.  Neurological:     Mental Status: He is alert and oriented to person, place, and time.  Psychiatric:        Mood and Affect: Mood normal.      UC Treatments / Results  Labs (all labs ordered are listed, but only abnormal results are displayed) Labs Reviewed - No data to display  EKG   Radiology No results  found.  Procedures Procedures (including critical care time)  Medications Ordered in UC Medications - No data to display  Initial Impression / Assessment and Plan / UC Course  I have reviewed the triage vital signs and  the nursing notes.  Pertinent labs & imaging results that were available during my care of the patient were reviewed by me and considered in my medical decision making (see chart for details).     MDM:  I suspect conjunctivitis with corneal abrasion.  Pt given rx for cipro eye drops.  Pt advised of need for eye recheck.  Next week.  He is to return if symptoms worsen or cahnge.  Final Clinical Impressions(s) / UC Diagnoses   Final diagnoses:  Abrasion of right cornea, initial encounter  Acute bacterial conjunctivitis of right eye     Discharge Instructions      See your Eye doctor for recheck next week.  Return if symptoms worsen or change   ED Prescriptions     Medication Sig Dispense Auth. Provider   ciprofloxacin (CILOXAN) 0.3 % ophthalmic solution Place 1 drop into both eyes every 2 (two) hours for 5 days. Administer 1 drop, every 2 hours, while awake, for 2 days. Then 1 drop, every 4 hours, while awake, for the next 5 days. 5 mL Elson Areas, [place]      PDMP not reviewed this encounter. An After Visit Summary was printed and given to the patient.       Elson Areas, [place] 06/13/22 1242

## 2022-06-13 NOTE — ED Notes (Signed)
Pt able to remove left contact, but not right one due to pain, RN  updated provider

## 2022-06-13 NOTE — Discharge Instructions (Addendum)
See your Eye doctor for recheck next week.  Return if symptoms worsen or change

## 2022-06-14 ENCOUNTER — Telehealth: Payer: Self-pay | Admitting: Emergency Medicine

## 2022-06-14 NOTE — Telephone Encounter (Signed)
Follow up call to [facility]. Pt states his eyes are less painful - no drainage from left, minimal irritation on the right. Light sensitivity has diminished. Will follow up with the eye doctor if it does not resolve. Pt verbalized an understanding that he should not wear contact lenses until eye is healed.

## 2022-06-29 ENCOUNTER — Other Ambulatory Visit: Payer: Self-pay | Admitting: Family Medicine

## 2022-06-29 DIAGNOSIS — I10 Essential (primary) hypertension: Secondary | ICD-10-CM

## 2022-06-29 DIAGNOSIS — R600 Localized edema: Secondary | ICD-10-CM

## 2022-07-04 ENCOUNTER — Other Ambulatory Visit: Payer: Self-pay

## 2022-07-04 DIAGNOSIS — K219 Gastro-esophageal reflux disease without esophagitis: Secondary | ICD-10-CM

## 2022-07-04 MED ORDER — FAMOTIDINE 20 MG PO TABS: 20.0000 mg | ORAL_TABLET | Freq: Every day | ORAL | 0 refills | Status: DC

## 2022-08-21 ENCOUNTER — Ambulatory Visit: Payer: BC Managed Care – PPO | Admitting: Family Medicine

## 2022-08-21 ENCOUNTER — Encounter: Payer: Self-pay | Admitting: Family Medicine

## 2022-08-21 VITALS — BP 138/76 | HR 75 | Ht 67.0 in | Wt 219.0 lb

## 2022-08-21 DIAGNOSIS — I10 Essential (primary) hypertension: Secondary | ICD-10-CM

## 2022-08-21 DIAGNOSIS — Z23 Encounter for immunization: Secondary | ICD-10-CM | POA: Diagnosis not present

## 2022-08-21 DIAGNOSIS — J301 Allergic rhinitis due to pollen: Secondary | ICD-10-CM

## 2022-08-21 NOTE — Assessment & Plan Note (Signed)
BP remains well controlled with HCTZ at current strength.

## 2022-08-21 NOTE — Progress Notes (Signed)
Adrian [name] - 62 y.o. male MRN [id number]  Date of birth: 11-04-1960  Subjective Chief Complaint  Patient presents with   Hypertension    HPI [name] is a 62 y.o. male here today for follow up.   He reports that he is doing well at this time.  He has had some congestion and rhinorrhea with post nasal drainage recently.   Taking claritin.  Has flonase but has not started this yet.  Denies fever/chills.  Has not had sinus pain.   BP remains fairly well controlled with HCTZ at current strength.  Denies side effects from medication at current strength.  He has not had chest pain, shortness of breath, palpitations, headache or vision changes.   ROS:  A comprehensive ROS was completed and negative except as noted per HPI  No Known Allergies  Past Medical History:  Diagnosis Date   Chronic right hip pain 08/06/2017   Chronic right-sided low back pain without sciatica 08/06/2017   GERD (gastroesophageal reflux disease)    Hypertension    Hypertriglyceridemia 08/06/2017   Seasonal allergies     Past Surgical History:  Procedure Laterality Date   ESOPHAGOGASTRODUODENOSCOPY     NO PAST SURGERIES     SHOULDER ARTHROSCOPY WITH SUBACROMIAL DECOMPRESSION AND BICEP TENDON REPAIR Left 05/31/2020   Procedure: LEFT SHOULDER ARTHROSCOPY WITH DEBRIDEMENT, DISTAL CLAVICAL EXCISION,  SUBACROMIAL DECOMPRESSION, BICEPS TENODESIS;  Surgeon: Hiram Gash, MD;  Location: [facility];  Service: Orthopedics;  Laterality: Left;    Social History   Socioeconomic History   Marital status: Married    Spouse name: Not on file   Number of children: Not on file   Years of education: Not on file   Highest education level: Associate degree: occupational, Hotel manager, or vocational program  Occupational History   Not on file  Tobacco Use   Smoking status: Never   Smokeless tobacco: Never  Vaping Use   Vaping Use: Never used  Substance and Sexual Activity   Alcohol use: No   Drug  use: No   Sexual activity: Yes    Birth control/protection: None  Other Topics Concern   Not on file  Social History Narrative   Not on file   Social Determinants of Health   Financial Resource Strain: Low Risk  (08/20/2022)   Overall Financial Resource Strain (CARDIA)    Difficulty of Paying Living Expenses: Not hard at all  Food Insecurity: No Food Insecurity (08/20/2022)   Hunger Vital Sign    Worried About Running Out of Food in the Last Year: Never true    [place] in the Last Year: Never true  Transportation Needs: No Transportation Needs (08/20/2022)   PRAPARE - Hydrologist (Medical): No    Lack of Transportation (Non-Medical): No  Physical Activity: Sufficiently Active (08/20/2022)   Exercise Vital Sign    Days of Exercise per Week: 3 days    Minutes of Exercise per Session: 120 min  Stress: No Stress Concern Present (08/20/2022)   [place]    Feeling of Stress : Only a little  Social Connections: Socially Integrated (08/20/2022)   Social Connection and Isolation Panel [NHANES]    Frequency of Communication with Friends and Family: Three times a week    Frequency of Social Gatherings with Friends and Family: Twice a week    Attends Religious Services: More than 4 times per year  Active Member of Clubs or Organizations: Yes    Attends Archivist Meetings: 1 to 4 times per year    Marital Status: Married    Family History  Problem Relation Age of Onset   Dementia Mother    Stroke Father    COPD Father    Heart failure Father    Asthma Father    Asthma Sister    Heart attack Paternal Uncle    Stroke Paternal Annabella Maintenance  Topic Date Due   COVID-19 Vaccine (3 - 2023-24 season) 02/21/2023 (Originally 01/24/2022)   DTaP/Tdap/Td (2 - Td or Tdap) 10/02/2022   COLONOSCOPY (Pts 45-66yrs Insurance coverage will need to be confirmed)  03/12/2023    INFLUENZA VACCINE  Completed   Hepatitis C Screening  Completed   HIV Screening  Completed   Zoster Vaccines- Shingrix  Completed   HPV VACCINES  Aged Out     ----------------------------------------------------------------------------------------------------------------------------------------------------------------------------------------------------------------- Physical Exam BP 138/76 (BP Location: Left Arm, Patient Position: Sitting, Cuff Size: Large)   Pulse 75   Ht 5\' 7"  (1.702 m)   Wt 219 lb (99.3 kg)   SpO2 97%   BMI 34.30 kg/m   Physical Exam Constitutional:      Appearance: Normal appearance.  HENT:     Head: Normocephalic and atraumatic.  Eyes:     General: No scleral icterus. Cardiovascular:     Rate and Rhythm: Normal rate and regular rhythm.  Pulmonary:     Effort: Pulmonary effort is normal.     Breath sounds: Normal breath sounds.  Neurological:     Mental Status: He is alert.  Psychiatric:        Mood and Affect: Mood normal.        Behavior: Behavior normal.     ------------------------------------------------------------------------------------------------------------------------------------------------------------------------------------------------------------------- Assessment and Plan  Hypertension goal BP (blood pressure) < 130/80 BP remains well controlled with HCTZ at current strength.    Seasonal allergic rhinitis due to pollen Discussed changing claritin to cetirizine.  Recommend trying astepro as well.  He'll let me know if ineffective.     No orders of the defined types were placed in this encounter.   Return in about 6 months (around 02/21/2023) for annual exam/fasting labs.    This visit occurred during the SARS-CoV-2 public health emergency.  Safety protocols were in place, including screening questions prior to the visit, additional usage of staff PPE, and extensive cleaning of exam room while observing appropriate contact  time as indicated for disinfecting solutions.

## 2022-08-21 NOTE — Assessment & Plan Note (Signed)
Discussed changing claritin to cetirizine.  Recommend trying astepro as well.  He'll let me know if ineffective.

## 2022-08-21 NOTE — Patient Instructions (Addendum)
Try changing claritin to cetirizine.  Try astepro nasal spray.  Continue current BP medication.   See me again in 6 months.

## 2022-09-12 IMAGING — DX DG ORBITS FOR FOREIGN BODY
2 series · 2 of 2 positions shown · non-contrast
Comparison: None.

CLINICAL DATA: Metal working/exposure; clearance prior to MRI

EXAM:
ORBITS FOR FOREIGN BODY - 2 VIEW

[orbits waters (1 of 2)]
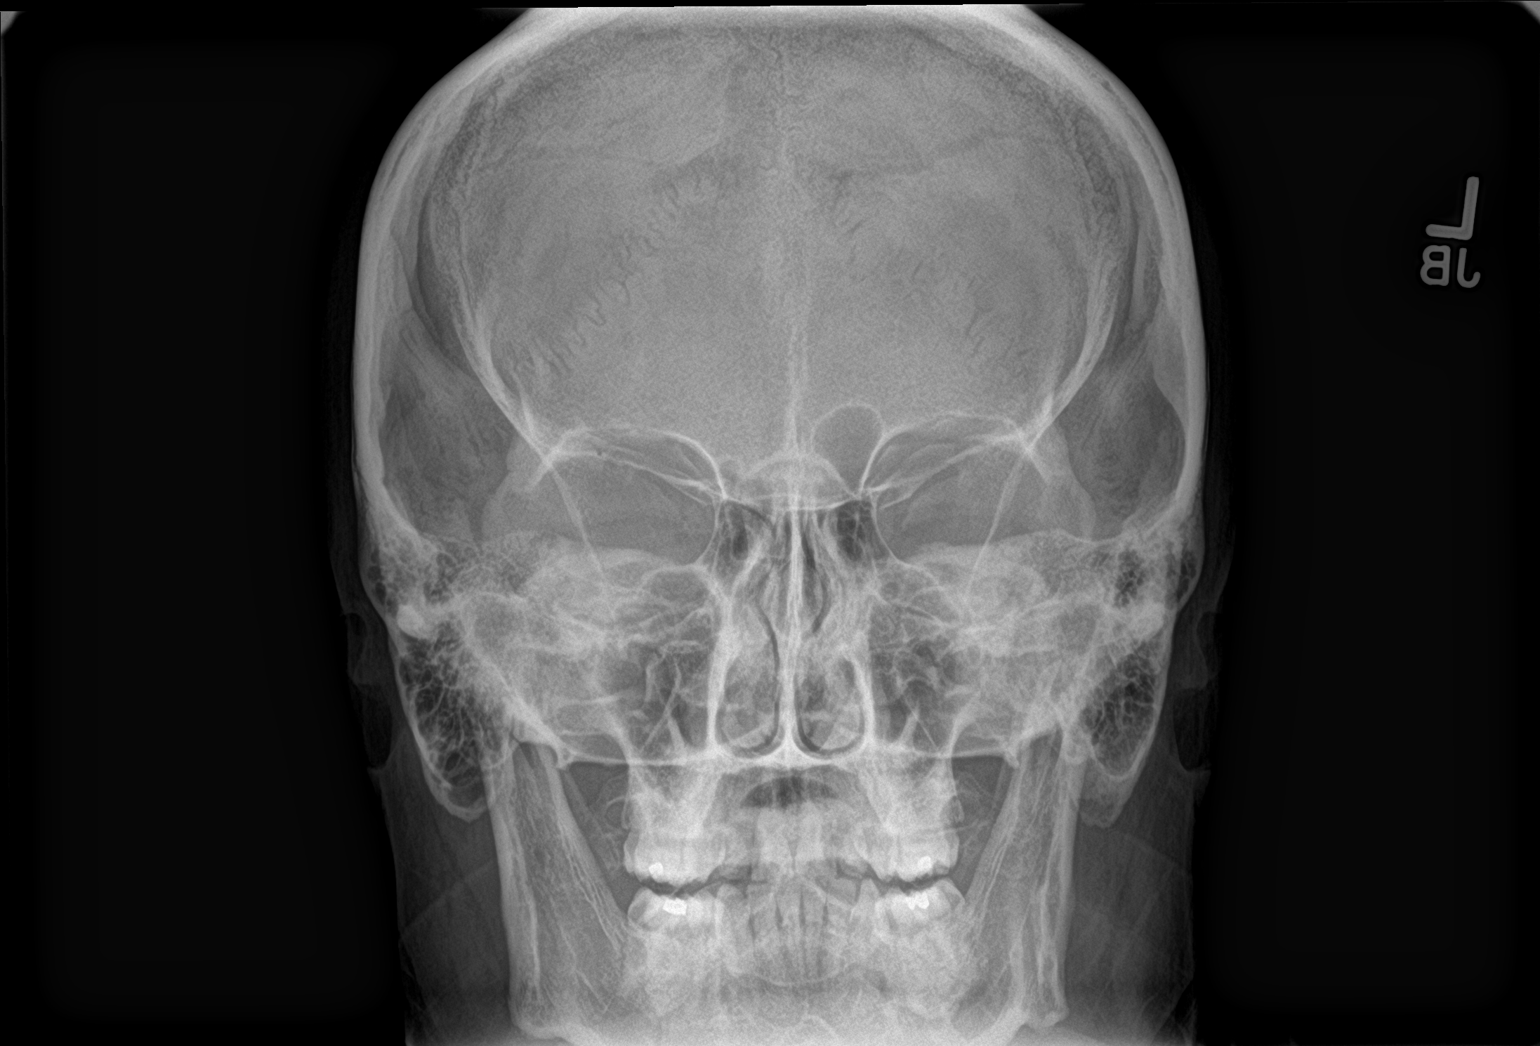

[orbits waters (2 of 2)]
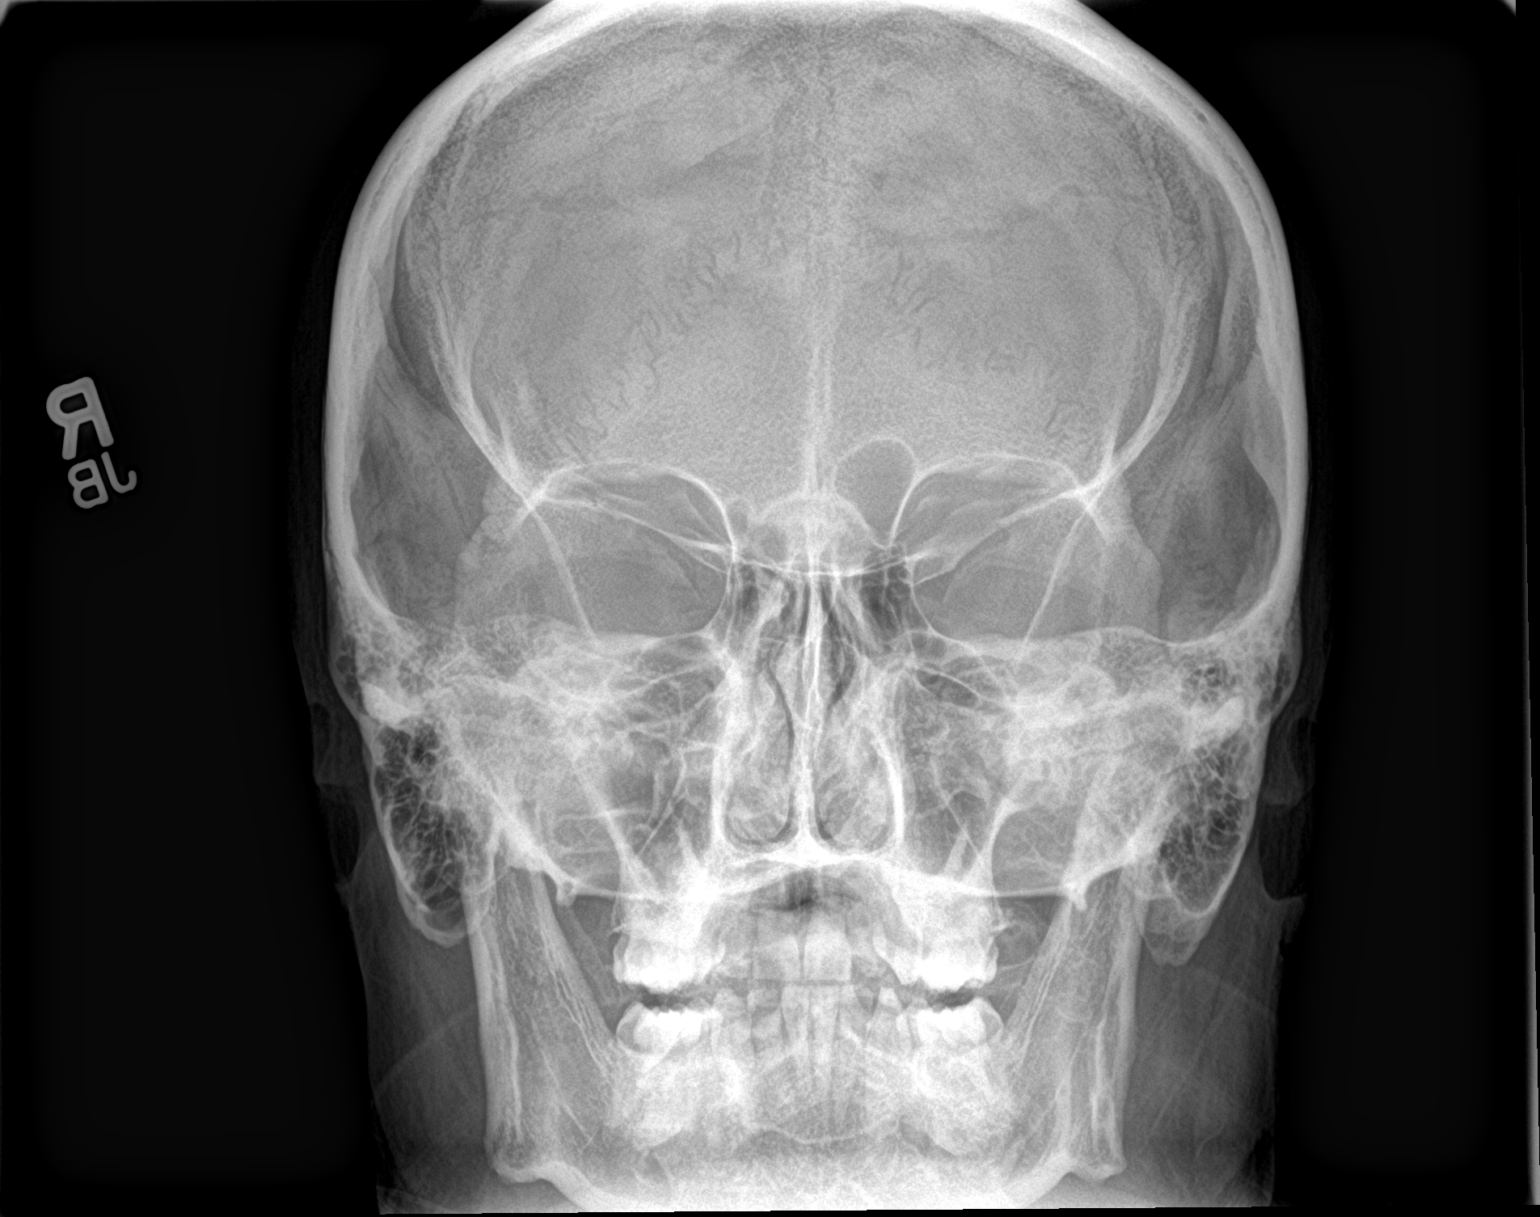

[2 of 2 positions shown; findings below may reference images not displayed]

FINDINGS: There is no evidence of metallic foreign body within the orbits. No
significant bone abnormality identified.
IMPRESSION: No evidence of metallic foreign body within the orbits.

## 2022-09-12 IMAGING — MR MR SHOULDER*L* W/O CM
5 series · 40 of 40 positions shown · non-contrast
Comparison: Radiographs 04/04/2020

CLINICAL DATA: Left shoulder pain.

EXAM:
MRI OF THE LEFT SHOULDER WITHOUT CONTRAST
TECHNIQUE: Multiplanar, multisequence MR imaging of the shoulder was performed.
No intravenous contrast was administered.

[Series 5: T2 fat-sat · axial · 4.0mm · 0.59mm/px · z∈[-55,+34]mm · 8 of 22 slices shown (1 of 3)]
[im 1/22]
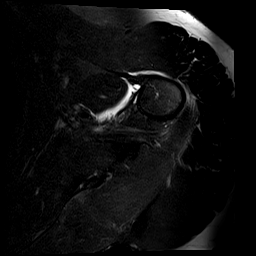
[im 4/22]
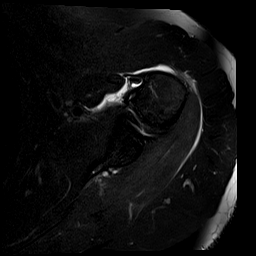
[im 7/22]
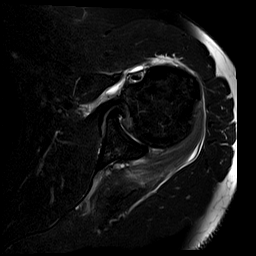
[im 10/22]
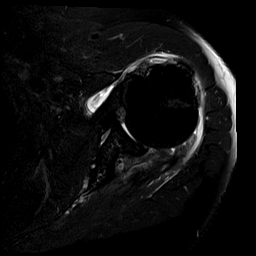
[im 13/22]
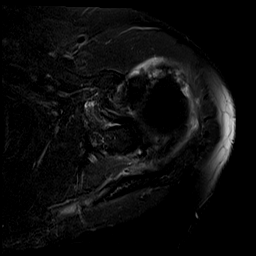
[im 16/22]
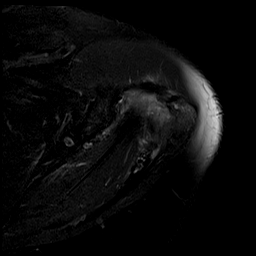
[im 19/22]
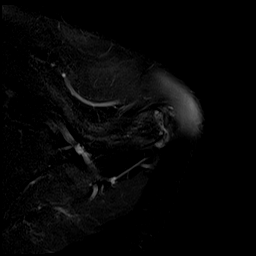
[im 22/22]
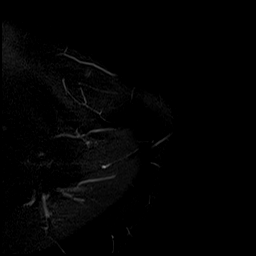

[Series 6: T2 fat-sat · oblique · 4.0mm · 0.59mm/px · 7 of 19 slices shown (2 of 3)]
[im 1/19]
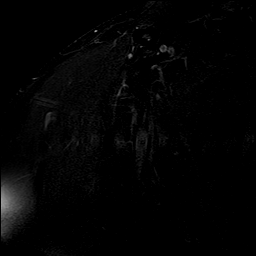
[im 4/19]
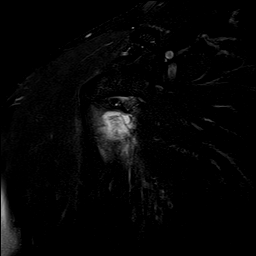
[im 7/19]
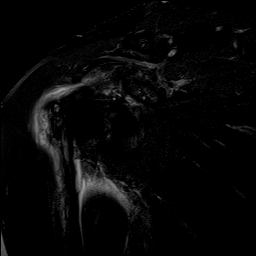
[im 10/19]
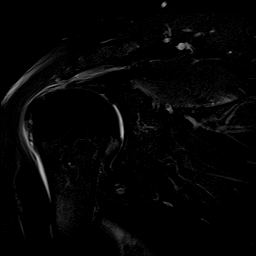
[im 13/19]
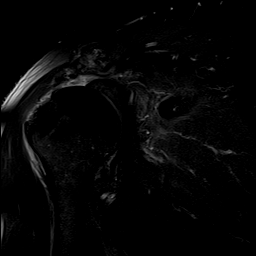
[im 16/19]
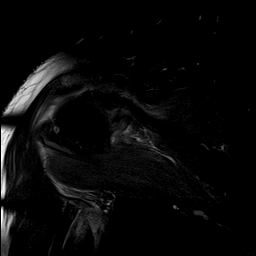
[im 19/19]
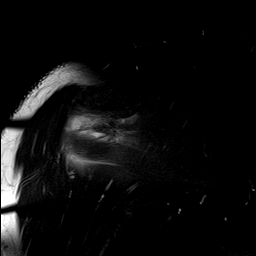

[Series 7: PD fat-sat · oblique · 4.0mm · 0.59mm/px · 7 of 19 slices shown]
[im 1/19]
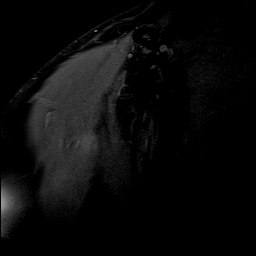
[im 4/19]
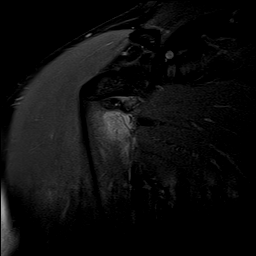
[im 7/19]
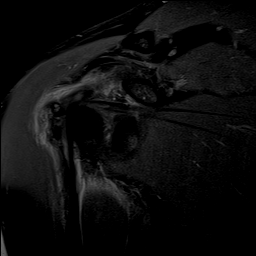
[im 10/19]
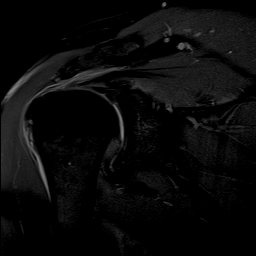
[im 13/19]
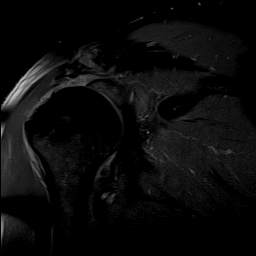
[im 16/19]
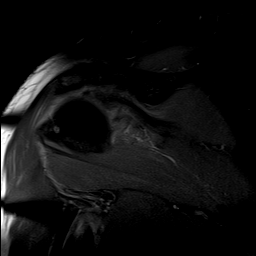
[im 19/19]
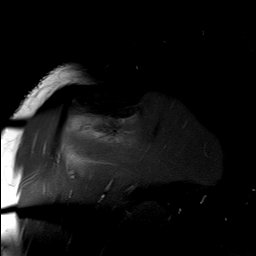

[Series 8: T1 · oblique · 4.0mm · 0.59mm/px · 9 of 24 slices shown]
[im 1/24]
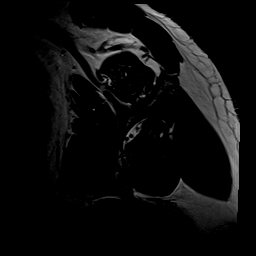
[im 3/24]
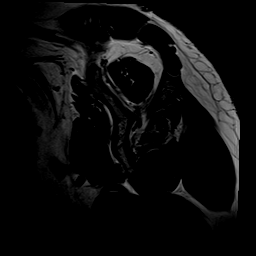
[im 6/24]
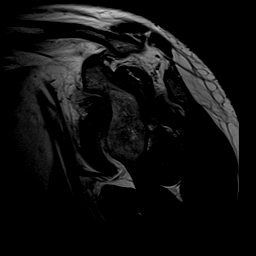
[im 9/24]
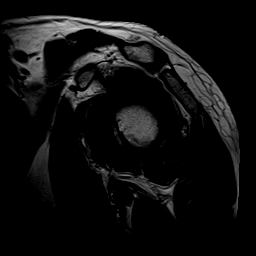
[im 12/24]
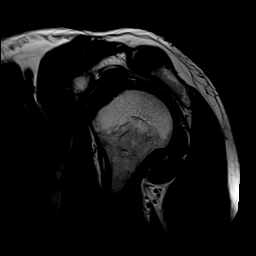
[im 15/24]
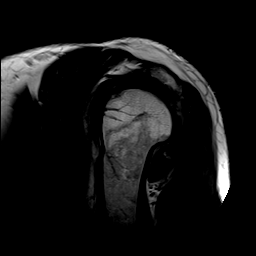
[im 18/24]
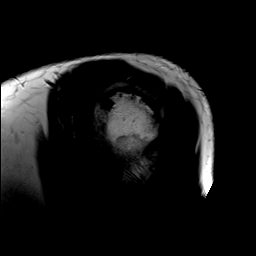
[im 21/24]
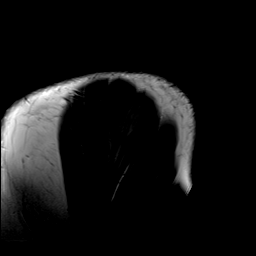
[im 24/24]
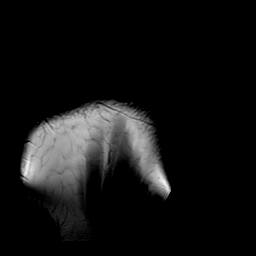

[Series 9: T2 fat-sat · oblique · 4.0mm · 0.55mm/px · 9 of 24 slices shown (3 of 3)]
[im 1/24]
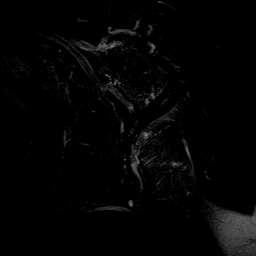
[im 3/24]
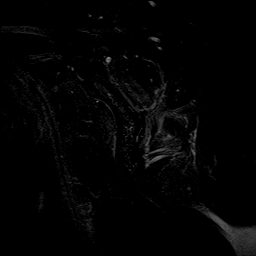
[im 6/24]
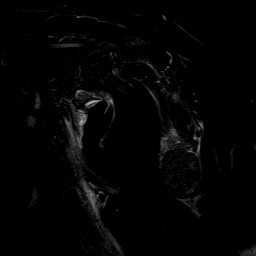
[im 9/24]
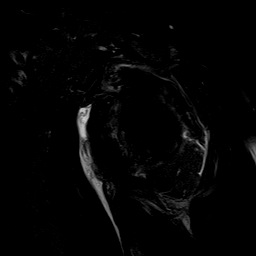
[im 12/24]
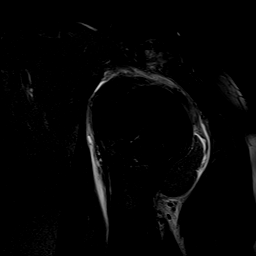
[im 15/24]
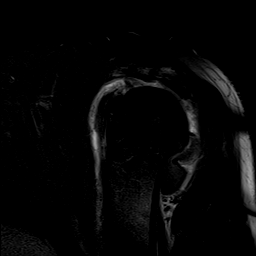
[im 18/24]
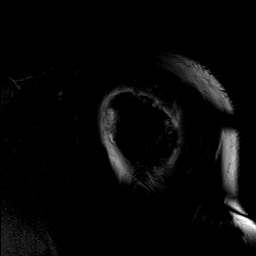
[im 21/24]
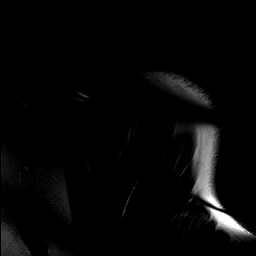
[im 24/24]
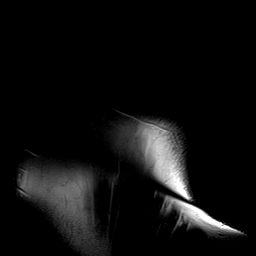

[40 of 40 positions shown; findings below may reference images not displayed]

FINDINGS: Rotator cuff: Large full-thickness retracted rotator cuff tear. The
infraspinatus tendon is completely torn and retracted approximately
3 cm. Most of the supraspinatus tendon is torn and retracted 3.2 cm.
Some of the very anterior most fibers are still intact. The
subscapularis tendon is intact.

Muscles:  Mild fatty atrophy of the infraspinatus muscle.

Biceps long head:  Intact

Acromioclavicular Joint: Moderate degenerative changes. Type 2
acromion. No lateral downsloping. Mild subacromial spurring.

Glenohumeral Joint: Moderate degenerative changes with degenerative
chondrosis, joint space narrowing and early spurring. Small joint
effusion.

Labrum:  Labral degenerative changes without definite tear.

Bones:  No acute bony findings.

Other: Expected fluid in the subacromial/subdeltoid bursa.
IMPRESSION: 1. Large full-thickness retracted rotator cuff tear as described
above.
2. Intact long head biceps tendon and glenoid labrum.
3. Moderate AC joint degenerative changes and mild subacromial
spurring.
4. Moderate glenohumeral joint degenerative changes.

## 2022-10-13 ENCOUNTER — Other Ambulatory Visit: Payer: Self-pay | Admitting: Family Medicine

## 2022-10-13 DIAGNOSIS — I10 Essential (primary) hypertension: Secondary | ICD-10-CM

## 2022-10-13 DIAGNOSIS — R600 Localized edema: Secondary | ICD-10-CM

## 2022-10-23 ENCOUNTER — Other Ambulatory Visit: Payer: Self-pay | Admitting: Family Medicine

## 2022-10-24 MED ORDER — LORATADINE 10 MG PO TABS: 10.0000 mg | ORAL_TABLET | Freq: Every day | ORAL | 0 refills | Status: DC

## 2023-01-13 DIAGNOSIS — H5213 Myopia, bilateral: Secondary | ICD-10-CM | POA: Diagnosis not present

## 2023-01-23 ENCOUNTER — Other Ambulatory Visit: Payer: Self-pay

## 2023-01-23 DIAGNOSIS — K219 Gastro-esophageal reflux disease without esophagitis: Secondary | ICD-10-CM

## 2023-01-23 MED ORDER — FAMOTIDINE 20 MG PO TABS: 20.0000 mg | ORAL_TABLET | Freq: Every day | ORAL | 0 refills | Status: DC

## 2023-01-26 ENCOUNTER — Other Ambulatory Visit: Payer: Self-pay | Admitting: Family Medicine

## 2023-02-23 ENCOUNTER — Ambulatory Visit
Admission: EM | Admit: 2023-02-23 | Discharge: 2023-02-23 | Disposition: A | Discharge status: Home / Self Care | Payer: BC Managed Care – PPO | Attending: Family Medicine | Admitting: Family Medicine

## 2023-02-23 ENCOUNTER — Encounter: Payer: BC Managed Care – PPO | Admitting: Family Medicine

## 2023-02-23 DIAGNOSIS — J0100 Acute maxillary sinusitis, unspecified: Secondary | ICD-10-CM | POA: Diagnosis not present

## 2023-02-23 DIAGNOSIS — J3489 Other specified disorders of nose and nasal sinuses: Secondary | ICD-10-CM | POA: Diagnosis not present

## 2023-02-23 DIAGNOSIS — R059 Cough, unspecified: Secondary | ICD-10-CM | POA: Diagnosis not present

## 2023-02-23 MED ORDER — PREDNISONE 10 MG (21) PO TBPK: ORAL_TABLET | Freq: Every day | ORAL | 0 refills | Status: DC

## 2023-02-23 MED ORDER — BENZONATATE 200 MG PO CAPS: 200.0000 mg | ORAL_CAPSULE | Freq: Three times a day (TID) | ORAL | 0 refills | Status: AC | PRN

## 2023-02-23 MED ORDER — AMOXICILLIN-POT CLAVULANATE 875-125 MG PO TABS: 1.0000 | ORAL_TABLET | Freq: Two times a day (BID) | ORAL | 0 refills | Status: AC

## 2023-02-23 NOTE — ED Provider Notes (Signed)
[name]    CSN: [id number] Arrival date & time: 02/23/23  1344      History   Chief Complaint Chief Complaint  Patient presents with   Cough   Nasal Congestion   Sore Throat    HPI Adrian [name] is a 62 y.o. male.   HPI Very pleasant 62 year old male presents with presents cough, nasal congestion and sore throat for 19 days. PMH significant for HTN, chronic right hip pain, and GERD.  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 04/04/2020   Primary osteoarthritis involving multiple joints 07/26/2018   Periscapular pain 07/20/2018   Hepatitis B core antibody positive 07/20/2018   Polyarthralgia 07/20/2018   At risk for obstructive sleep apnea 07/20/2018   Seasonal allergic rhinitis due to pollen 11/02/2017   Hypertriglyceridemia 08/06/2017   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   Gastroesophageal reflux disease without esophagitis 07/09/2017   Hypertension goal BP (blood pressure) < 130/80 07/09/2017   Class 2 obesity due to excess calories in adult 07/09/2017    Past Surgical History:  Procedure Laterality Date   ESOPHAGOGASTRODUODENOSCOPY     NO PAST SURGERIES     SHOULDER ARTHROSCOPY WITH SUBACROMIAL DECOMPRESSION AND BICEP TENDON REPAIR Left 05/31/2020   Procedure: LEFT SHOULDER ARTHROSCOPY WITH DEBRIDEMENT, DISTAL CLAVICAL EXCISION,  SUBACROMIAL DECOMPRESSION, BICEPS TENODESIS;  Surgeon: [name], MD;  Location: [facility];  Service: Orthopedics;  Laterality: Left;       Home Medications    Prior to Admission medications   Medication Sig Start Date End  Date Taking? Authorizing Provider  amoxicillin-clavulanate (AUGMENTIN) 875-125 MG tablet Take 1 tablet by mouth 2 (two) times daily for 10 days. 02/23/23 03/05/23 Yes Trevor Iha, FNP  benzonatate (TESSALON) 200 MG capsule Take 1 capsule (200 mg total) by mouth 3 (three) times daily as needed for up to 7 days. 02/23/23 03/02/23 Yes Trevor Iha, FNP  predniSONE (STERAPRED UNI-PAK 21 TAB) 10 MG (21) TBPK tablet Take by mouth daily. Take 6 tabs by mouth daily  for 2 days, then 5 tabs for 2 days, then 4 tabs for 2 days, then 3 tabs for 2 days, 2 tabs for 2 days, then 1 tab by mouth daily for 2 days 02/23/23  Yes Trevor Iha, FNP  aspirin EC 81 MG tablet Take 1 tablet (81 mg total) by mouth daily. 07/09/17   Carlis Stable, PA-C  famotidine (PEPCID) 20 MG tablet Take 1-2 tablets (20-40 mg total) by mouth daily. 01/23/23   Agapito Games, MD  hydrochlorothiazide (HYDRODIURIL) 12.5 MG tablet TAKE 1 TABLET(12.5 MG) BY MOUTH DAILY 10/14/22   Everrett Coombe, DO  loratadine (CLARITIN) 10 MG tablet TAKE 1 TABLET(10 MG) BY MOUTH DAILY 01/28/23   Everrett Coombe, DO    Family History Family History  Problem Relation Age of Onset   Dementia Mother    Stroke Father    COPD Father    Heart failure Father    Asthma Father    Asthma Sister    Heart attack Paternal Uncle    Stroke Paternal Uncle     Social History  Social History   Tobacco Use   Smoking status: Never   Smokeless tobacco: Never  Vaping Use   Vaping status: Never Used  Substance Use Topics   Alcohol use: No   Drug use: No     Allergies   Patient has no known allergies.   Review of Systems Review of Systems  HENT:  Positive for congestion, sinus pressure and sinus pain.   Respiratory:  Positive for cough.   All other systems reviewed and are negative.    Physical Exam Triage Vital Signs ED Triage Vitals [02/23/23 1351]  Encounter Vitals Group     BP (!) 143/88     Systolic BP Percentile       Diastolic BP Percentile      Pulse Rate 66     Resp 17     Temp 98.2 F (36.8 C)     Temp Source Oral     SpO2 97 %     Weight      Height      Head Circumference      Peak Flow      Pain Score      Pain Loc      Pain Education      Exclude from Growth Chart    No data found.  Updated Vital Signs BP (!) 143/88 (BP Location: Right Arm)   Pulse 66   Temp 98.2 F (36.8 C) (Oral)   Resp 17   SpO2 97%    Physical Exam Vitals and nursing note reviewed.  Constitutional:      Appearance: Normal appearance. He is obese. He is ill-appearing.  HENT:     Head: Normocephalic and atraumatic.     Right Ear: Tympanic membrane and external ear normal.     Left Ear: Tympanic membrane and external ear normal.     Ears:     Comments: Significant eustachian tube dysfunction noted bilaterally    Nose:     Comments: Turbinates are erythematous/edematous    Mouth/Throat:     Mouth: Mucous membranes are moist.     Pharynx: Oropharynx is clear.     Comments: Moderate amount of clear drainage of posterior oropharynx noted Eyes:     Extraocular Movements: Extraocular movements intact.     Conjunctiva/sclera: Conjunctivae normal.     Pupils: Pupils are equal, round, and reactive to light.  Cardiovascular:     Rate and Rhythm: Normal rate and regular rhythm.     Pulses: Normal pulses.     Heart sounds: Normal heart sounds.  Pulmonary:     Effort: Pulmonary effort is normal.     Breath sounds: Normal breath sounds. No wheezing, rhonchi or rales.     Comments: Infrequent nonproductive cough noted on exam Musculoskeletal:        General: Normal range of motion.     Cervical back: Normal range of motion and neck supple.  Skin:    General: Skin is warm and dry.  Neurological:     General: No focal deficit present.     Mental Status: He is alert and oriented to person, place, and time. Mental status is at baseline.  Psychiatric:        Mood and Affect: Mood normal.        Behavior:  Behavior normal.        Thought Content: Thought content normal.      UC Treatments / Results  Labs (all labs ordered are listed, but only abnormal results  are displayed) Labs Reviewed - No data to display  EKG   Radiology No results found.  Procedures Procedures (including critical care time)  Medications Ordered in UC Medications - No data to display  Initial Impression / Assessment and Plan / UC Course  I have reviewed the triage vital signs and the nursing notes.  Pertinent labs & imaging results that were available during my care of the patient were reviewed by me and considered in my medical decision making (see chart for details).     MDM: 1.  Acute maxillary sinusitis, recurrence not specified-Rx'd Augmentin 875/125 mg tablet: Take 1 tablet twice daily x 10 days; 2.  Sinus pressure-Rx'd Sterapred Unipak (tapering from 60 mg to 10 mg over 10 days; 3.  Cough, unspecified type-Rx'd Tessalon 200 mg capsules 3 times daily, as needed for cough. Instructed patient to take medications as directed with food to completion.  Advised patient to take prednisone with first dose of Augmentin for the next 10 days.  Advised may use Tessalon capsules daily or as needed for cough.  Encouraged to increase daily water intake to 64 ounces per day while taking these medications.  Advised if symptoms worsen and/or unresolved please follow-up with PCP or here for further evaluation.  Patient discharged home, hemodynamically stable. Final Clinical Impressions(s) / UC Diagnoses   Final diagnoses:  Acute maxillary sinusitis, recurrence not specified  Sinus pressure  Cough, unspecified type     Discharge Instructions      Instructed patient to take medications as directed with food to completion.  Advised patient to take prednisone with first dose of Augmentin for the next 10 days.  Advised may use Tessalon capsules daily or as needed for cough.  Encouraged to increase daily water intake to 64  ounces per day while taking these medications.  Advised if symptoms worsen and/or unresolved please follow-up with PCP or here for further evaluation.     ED Prescriptions     Medication Sig Dispense Auth. Provider   amoxicillin-clavulanate (AUGMENTIN) 875-125 MG tablet Take 1 tablet by mouth 2 (two) times daily for 10 days. 20 tablet Trevor Iha, FNP   predniSONE (STERAPRED UNI-PAK 21 TAB) 10 MG (21) TBPK tablet Take by mouth daily. Take 6 tabs by mouth daily  for 2 days, then 5 tabs for 2 days, then 4 tabs for 2 days, then 3 tabs for 2 days, 2 tabs for 2 days, then 1 tab by mouth daily for 2 days 42 tablet Trevor Iha, FNP   benzonatate (TESSALON) 200 MG capsule Take 1 capsule (200 mg total) by mouth 3 (three) times daily as needed for up to 7 days. 40 capsule Trevor Iha, FNP      PDMP not reviewed this encounter.   Trevor Iha, FNP 02/23/23 1436

## 2023-02-23 NOTE — ED Triage Notes (Signed)
Pt c/o cough, congestion and sore throat/hoarse x 10 days. Hx of sinus infections. Taking delsym, theraflu, nyquil and claritin prn.

## 2023-02-23 NOTE — Discharge Instructions (Addendum)
Instructed patient to take medications as directed with food to completion.  Advised patient to take prednisone with first dose of Augmentin for the next 10 days.  Advised may use Tessalon capsules daily or as needed for cough.  Encouraged to increase daily water intake to 64 ounces per day while taking these medications.  Advised if symptoms worsen and/or unresolved please follow-up with PCP or here for further evaluation.

## 2023-03-16 ENCOUNTER — Ambulatory Visit: Payer: BC Managed Care – PPO | Admitting: Family Medicine

## 2023-03-16 VITALS — BP 130/81 | HR 68 | Ht 67.0 in | Wt 232.0 lb

## 2023-03-16 DIAGNOSIS — R059 Cough, unspecified: Secondary | ICD-10-CM | POA: Insufficient documentation

## 2023-03-16 DIAGNOSIS — R052 Subacute cough: Secondary | ICD-10-CM | POA: Diagnosis not present

## 2023-03-16 MED ORDER — BENZONATATE 200 MG PO CAPS: 200.0000 mg | ORAL_CAPSULE | Freq: Two times a day (BID) | ORAL | 0 refills | Status: DC | PRN

## 2023-03-16 MED ORDER — HYDROCOD POLI-CHLORPHE POLI ER 10-8 MG/5ML PO SUER: 5.0000 mL | Freq: Two times a day (BID) | ORAL | 0 refills | Status: DC | PRN

## 2023-03-16 MED ORDER — AZITHROMYCIN 250 MG PO TABS: ORAL_TABLET | ORAL | 0 refills | Status: AC

## 2023-03-16 NOTE — Assessment & Plan Note (Signed)
Completed course of augmentin and prednisone.  Slightly course breath sounds will add azithromycin for atypical coverage as well.  Tessalon renewed and add tussionex as needed at night.

## 2023-03-16 NOTE — Progress Notes (Signed)
Adrian [name] - 62 y.o. male MRN [id number]  Date of birth: 29-May-1960  Subjective Chief Complaint  Patient presents with   Cough    HPI Adrian [name] is a 62 y.o. male here today with complaint of congestion, sinus pain and pressure, cough.  He has had symptoms for almost 1 month.  Seen at urgent care and prescribed course of augmentin and prednisone.  Reports that symptoms of sinus pressure improved but his cough and dyspnea still persist.  In addition to prescribed medications he has tried tylenol PM and nyquil as well as delsym.  Main concern is still his cough.  It has gotten better but once he starts coughing he has trouble controlling this.    ROS:  A comprehensive ROS was completed and negative except as noted per HPI  No Known Allergies  Past Medical History:  Diagnosis Date   Chronic right hip pain 08/06/2017   Chronic right-sided low back pain without sciatica 08/06/2017   GERD (gastroesophageal reflux disease)    Hypertension    Hypertriglyceridemia 08/06/2017   Seasonal allergies     Past Surgical History:  Procedure Laterality Date   ESOPHAGOGASTRODUODENOSCOPY     NO PAST SURGERIES     SHOULDER ARTHROSCOPY WITH SUBACROMIAL DECOMPRESSION AND BICEP TENDON REPAIR Left 05/31/2020   Procedure: LEFT SHOULDER ARTHROSCOPY WITH DEBRIDEMENT, DISTAL CLAVICAL EXCISION,  SUBACROMIAL DECOMPRESSION, BICEPS TENODESIS;  Surgeon: [name], MD;  Location: [facility];  Service: Orthopedics;  Laterality: Left;    Social History   Socioeconomic History   Marital status: Married    Spouse name: Not on file   Number of children: Not on file   Years of education: Not on file   Highest education level: Associate degree: occupational, Scientist, product/process development, or vocational program  Occupational History   Not on file  Tobacco Use   Smoking status: Never   Smokeless tobacco: Never  Vaping Use   Vaping status: Never Used  Substance and Sexual Activity   Alcohol use: No   Drug  use: No   Sexual activity: Yes    Birth control/protection: None  Other Topics Concern   Not on file  Social History Narrative   Not on file   Social Determinants of Health   Financial Resource Strain: Low Risk  (03/16/2023)   Overall Financial Resource Strain (CARDIA)    Difficulty of Paying Living Expenses: Not very hard  Food Insecurity: No Food Insecurity (03/16/2023)   Hunger Vital Sign    Worried About Running Out of Food in the Last Year: Never true    Ran Out of Food in the Last Year: Never true  Transportation Needs: No Transportation Needs (03/16/2023)   PRAPARE - Administrator, Civil Service (Medical): No    Lack of Transportation (Non-Medical): No  Physical Activity: Inactive (03/16/2023)   Exercise Vital Sign    Days of Exercise per Week: 0 days    Minutes of Exercise per Session: 120 min  Stress: No Stress Concern Present (03/16/2023)   [employer] of Occupational Health - Occupational Stress Questionnaire    Feeling of Stress : Only a little  Social Connections: Socially Integrated (03/16/2023)   Social Connection and Isolation Panel [NHANES]    Frequency of Communication with Friends and Family: Twice a week    Frequency of Social Gatherings with Friends and Family: Twice a week    Attends Religious Services: More than 4 times per year    Active Member of  Clubs or Organizations: Yes    Attends Engineer, structural: More than 4 times per year    Marital Status: Married    Family History  Problem Relation Age of Onset   Dementia Mother    Stroke Father    COPD Father    Heart failure Father    Asthma Father    Asthma Sister    Heart attack Paternal Uncle    Stroke Paternal Uncle     Health Maintenance  Topic Date Due   Colonoscopy  03/12/2023   INFLUENZA VACCINE  08/24/2023 (Originally 12/25/2022)   COVID-19 Vaccine (4 - 2023-24 season) 03/31/2024 (Originally 01/25/2023)   DTaP/Tdap/Td (4 - Td or Tdap) 01/22/2033   Hepatitis  C Screening  Completed   HIV Screening  Completed   Zoster Vaccines- Shingrix  Completed   HPV VACCINES  Aged Out     ----------------------------------------------------------------------------------------------------------------------------------------------------------------------------------------------------------------- Physical Exam BP 130/81 (BP Location: Left Arm, Patient Position: Sitting, Cuff Size: Large)   Pulse 68   Ht 5\' 7"  (1.702 m)   Wt 232 lb (105.2 kg)   SpO2 98%   BMI 36.34 kg/m   Physical Exam Constitutional:      Appearance: Normal appearance.  HENT:     Head: Normocephalic and atraumatic.  Eyes:     General: No scleral icterus. Cardiovascular:     Rate and Rhythm: Normal rate and regular rhythm.  Pulmonary:     Comments: Slightly course breath sounds LLL.   Neurological:     Mental Status: He is alert.  Psychiatric:        Mood and Affect: Mood normal.        Behavior: Behavior normal.     ------------------------------------------------------------------------------------------------------------------------------------------------------------------------------------------------------------------- Assessment and Plan  Cough Completed course of augmentin and prednisone.  Slightly course breath sounds will add azithromycin for atypical coverage as well.  Tessalon renewed and add tussionex as needed at night.    Meds ordered this encounter  Medications   azithromycin (ZITHROMAX) 250 MG tablet    Sig: Take 2 tablets on day 1, then 1 tablet daily on days 2 through 5    Dispense:  6 tablet    Refill:  0   benzonatate (TESSALON) 200 MG capsule    Sig: Take 1 capsule (200 mg total) by mouth 2 (two) times daily as needed for cough.    Dispense:  20 capsule    Refill:  0   chlorpheniramine-HYDROcodone (TUSSIONEX) 10-8 MG/5ML    Sig: Take 5 mLs by mouth every 12 (twelve) hours as needed for cough.    Dispense:  130 mL    Refill:  0    No  follow-ups on file.    This visit occurred during the SARS-CoV-2 public health emergency.  Safety protocols were in place, including screening questions prior to the visit, additional usage of staff PPE, and extensive cleaning of exam room while observing appropriate contact time as indicated for disinfecting solutions.

## 2023-04-26 ENCOUNTER — Other Ambulatory Visit: Payer: Self-pay | Admitting: Family Medicine

## 2023-04-26 DIAGNOSIS — I10 Essential (primary) hypertension: Secondary | ICD-10-CM

## 2023-04-26 DIAGNOSIS — R600 Localized edema: Secondary | ICD-10-CM

## 2023-05-18 ENCOUNTER — Other Ambulatory Visit: Payer: Self-pay | Admitting: Family Medicine

## 2023-06-17 ENCOUNTER — Ambulatory Visit
Admission: EM | Admit: 2023-06-17 | Discharge: 2023-06-17 | Disposition: A | Discharge status: Home / Self Care | Payer: BC Managed Care – PPO | Attending: Family Medicine | Admitting: Family Medicine

## 2023-06-17 ENCOUNTER — Encounter: Payer: Self-pay | Admitting: Emergency Medicine

## 2023-06-17 ENCOUNTER — Ambulatory Visit: Payer: BC Managed Care – PPO

## 2023-06-17 DIAGNOSIS — J329 Chronic sinusitis, unspecified: Secondary | ICD-10-CM

## 2023-06-17 DIAGNOSIS — J0101 Acute recurrent maxillary sinusitis: Secondary | ICD-10-CM

## 2023-06-17 DIAGNOSIS — R0981 Nasal congestion: Secondary | ICD-10-CM | POA: Diagnosis not present

## 2023-06-17 MED ORDER — CETIRIZINE HCL 10 MG PO TABS: ORAL_TABLET | ORAL | 2 refills | Status: DC

## 2023-06-17 MED ORDER — DOXYCYCLINE HYCLATE 100 MG PO CAPS: ORAL_CAPSULE | ORAL | 0 refills | Status: DC

## 2023-06-17 MED ORDER — PREDNISONE 20 MG PO TABS: ORAL_TABLET | ORAL | 0 refills | Status: DC

## 2023-06-17 NOTE — ED Triage Notes (Signed)
Patient c/o possible sinus infection, congestion, cough, bilateral ear ringing, sore throat, sinus pressure for several months.  Patient has had several rounds of antibiotics, it would get better then revert back to the same sx's.  Patient has taken Claritin.

## 2023-06-17 NOTE — ED Provider Notes (Signed)
[name]    CSN: [id number] Arrival date & time: 06/17/23  1347      History   Chief Complaint Chief Complaint  Patient presents with   Sinus Pressure    HPI IDEN MASSETT is a 63 y.o. male.   Patient complains of sinus congestion and increased sinus pressure for about a month.  He has a recurring cough from post-nasal drainage.  This week he has had mild ringing in his ears, "hot flashes," and a "chemical taste."  He reports he has had several courses of antibiotics in the past several month, resulting in improvement followed by recurrence.  He takes Claritin on a daily basis.  The history is provided by the patient.    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 04/04/2020   Primary osteoarthritis involving multiple joints 07/26/2018   Periscapular pain 07/20/2018   Hepatitis B core antibody positive 07/20/2018   Polyarthralgia 07/20/2018   At risk for obstructive sleep apnea 07/20/2018   Seasonal allergic rhinitis due to pollen 11/02/2017   Hypertriglyceridemia 08/06/2017   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   Gastroesophageal reflux disease without esophagitis 07/09/2017   Hypertension goal BP (blood pressure) < 130/80 07/09/2017   Class 2 obesity due to excess calories in adult 07/09/2017    Past Surgical History:  Procedure Laterality Date   ESOPHAGOGASTRODUODENOSCOPY     NO PAST SURGERIES     SHOULDER ARTHROSCOPY WITH SUBACROMIAL DECOMPRESSION AND BICEP TENDON REPAIR Left 05/31/2020   Procedure: LEFT SHOULDER ARTHROSCOPY WITH DEBRIDEMENT, DISTAL CLAVICAL  EXCISION,  SUBACROMIAL DECOMPRESSION, BICEPS TENODESIS;  Surgeon: [name], MD;  Location: [facility];  Service: Orthopedics;  Laterality: Left;       Home Medications    Prior to Admission medications   Medication Sig Start Date End Date Taking? Authorizing Provider  aspirin EC 81 MG tablet Take 1 tablet (81 mg total) by mouth daily. 07/09/17  Yes Carlis Stable, PA-C  cetirizine (ZYRTEC ALLERGY) 10 MG tablet Take one tab PO once daily for allergy symptoms 06/17/23  Yes Lattie Haw, MD  doxycycline (VIBRAMYCIN) 100 MG capsule Take one cap PO Q12hr with food. 06/17/23  Yes Lattie Haw, MD  famotidine (PEPCID) 20 MG tablet Take 1-2 tablets (20-40 mg total) by mouth daily. 01/23/23  Yes Agapito Games, MD  hydrochlorothiazide (HYDRODIURIL) 12.5 MG tablet TAKE 1 TABLET(12.5 MG) BY MOUTH DAILY 04/28/23  Yes Everrett Coombe, DO  predniSONE (DELTASONE) 20 MG tablet Take one tab by mouth twice daily for 4 days, then one daily. Take with food. 06/17/23  Yes Lattie Haw, MD  benzonatate (TESSALON) 200 MG capsule Take 1 capsule (200 mg total) by mouth 2 (two) times daily as needed for cough. 03/16/23   Everrett Coombe, DO  chlorpheniramine-HYDROcodone (TUSSIONEX) 10-8 MG/5ML Take 5 mLs by mouth every 12 (twelve) hours as needed for cough. 03/16/23   Everrett Coombe, DO    Family History Family History  Problem Relation Age of Onset   Dementia Mother    Stroke Father    COPD Father  Heart failure Father    Asthma Father    Asthma Sister    Heart attack Paternal Uncle    Stroke Paternal Uncle     Social History Social History   Tobacco Use   Smoking status: Never   Smokeless tobacco: Never  Vaping Use   Vaping status: Never Used  Substance Use Topics   Alcohol use: No   Drug use: No     Allergies   Patient has no known allergies.   Review of Systems Review of Systems ? sore throat + cough No pleuritic pain No wheezing +  nasal congestion + post-nasal drainage + sinus pain/pressure No itchy/red eyes No earache No hemoptysis No SOB ? fever No nausea No vomiting No abdominal pain No diarrhea No urinary symptoms No skin rash No fatigue No myalgias NO headache Used OTC meds (Dayquil, Nyquil) without relief   Physical Exam Triage Vital Signs ED Triage Vitals  Encounter Vitals Group     BP 06/17/23 1421 134/88     Systolic BP Percentile --      Diastolic BP Percentile --      Pulse Rate 06/17/23 1421 65     Resp 06/17/23 1421 18     Temp 06/17/23 1421 98.5 F (36.9 C)     Temp Source 06/17/23 1421 Oral     SpO2 06/17/23 1421 96 %     Weight 06/17/23 1423 230 lb (104.3 kg)     Height 06/17/23 1423 5\' 7"  (1.702 m)     Head Circumference --      Peak Flow --      Pain Score 06/17/23 1423 3     Pain Loc --      Pain Education --      Exclude from Growth Chart --    No data found.  Updated Vital Signs BP 134/88 (BP Location: Right Arm)   Pulse 65   Temp 98.5 F (36.9 C) (Oral)   Resp 18   Ht 5\' 7"  (1.702 m)   Wt 104.3 kg   SpO2 96%   BMI 36.02 kg/m   Visual Acuity Right Eye Distance:   Left Eye Distance:   Bilateral Distance:    Right Eye Near:   Left Eye Near:    Bilateral Near:     Physical Exam Nursing notes and Vital Signs reviewed. Appearance:  Patient appears stated age, and in no acute distress Eyes:  Pupils are equal, round, and reactive to light and accomodation.  Extraocular movement is intact.  Conjunctivae are not inflamed  Ears:  Canals partly occluded with cerumen.  Nose:  Congested turbinates.  Maxillary sinus tenderness is present.  Pharynx:  Normal Neck:  Supple. No adenopathy.  Lungs:  Clear to auscultation.  Breath sounds are equal.  Moving air well. Heart:  Regular rate and rhythm without murmurs, rubs, or gallops.  Extremities:  No edema.  Skin:  No rash present.   UC Treatments / Results  Labs (all labs ordered are listed, but only abnormal  results are displayed) Labs Reviewed - No data to display  EKG   Radiology DG Sinuses Complete Result Date: 06/17/2023 CLINICAL DATA:  Chronic sinus congestion. EXAM: PARANASAL SINUSES - COMPLETE 3 + VIEW COMPARISON:  None Available. FINDINGS: Maxillary, ethmoid, sphenoid, and mastoid sinuses appear clear. Small left frontal sinus with hypoplastic right frontal sinus. Skull is unremarkable. IMPRESSION: No radiographic evidence for paranasal sinus disease. Electronically Signed   By: Kennith Center M.D.   On:  06/17/2023 15:27    Procedures Procedures (including critical care time)  Medications Ordered in UC Medications - No data to display  Initial Impression / Assessment and Plan / UC Course  I have reviewed the triage vital signs and the nursing notes.  Pertinent labs & imaging results that were available during my care of the patient were reviewed by me and considered in my medical decision making (see chart for details).    Begin doxycycline 100mg  Q12hr and prednisone burst/taper. Rx for Zyrtec. Followup with ENT if not improved 2 weeks.  Final Clinical Impressions(s) / UC Diagnoses   Final diagnoses:  Chronic congestion of paranasal sinus  Acute recurrent maxillary sinusitis     Discharge Instructions      May use Afrin nasal spray (or generic oxymetazoline) each morning for about 5 days and then discontinue.  Also recommend using saline nasal spray several times daily and saline nasal irrigation (AYR is a common brand).  Use Flonase nasal spray each morning after using Afrin nasal spray and saline nasal irrigation. Recommend rotating antihistamines (such as Allegra, Claritin, and Zyrtec) on a monthly basis.      ED Prescriptions     Medication Sig Dispense Auth. Provider   doxycycline (VIBRAMYCIN) 100 MG capsule Take one cap PO Q12hr with food. 20 capsule Lattie Haw, MD   predniSONE (DELTASONE) 20 MG tablet Take one tab by mouth twice daily for 4 days, then  one daily. Take with food. 12 tablet Lattie Haw, MD   cetirizine (ZYRTEC ALLERGY) 10 MG tablet Take one tab PO once daily for allergy symptoms 30 tablet Lattie Haw, MD         Lattie Haw, MD 06/18/23 [id number]

## 2023-06-17 NOTE — Discharge Instructions (Signed)
May use Afrin nasal spray (or generic oxymetazoline) each morning for about 5 days and then discontinue.  Also recommend using saline nasal spray several times daily and saline nasal irrigation (AYR is a common brand).  Use Flonase nasal spray each morning after using Afrin nasal spray and saline nasal irrigation. Recommend rotating antihistamines (such as Allegra, Claritin, and Zyrtec) on a monthly basis.

## 2023-06-19 DIAGNOSIS — K648 Other hemorrhoids: Secondary | ICD-10-CM | POA: Diagnosis not present

## 2023-06-19 DIAGNOSIS — K635 Polyp of colon: Secondary | ICD-10-CM | POA: Diagnosis not present

## 2023-06-19 DIAGNOSIS — D123 Benign neoplasm of transverse colon: Secondary | ICD-10-CM | POA: Diagnosis not present

## 2023-06-19 DIAGNOSIS — Z1211 Encounter for screening for malignant neoplasm of colon: Secondary | ICD-10-CM | POA: Diagnosis not present

## 2023-06-19 LAB — HM COLONOSCOPY

## 2023-11-10 ENCOUNTER — Other Ambulatory Visit: Payer: Self-pay

## 2023-11-10 DIAGNOSIS — I10 Essential (primary) hypertension: Secondary | ICD-10-CM

## 2023-11-10 DIAGNOSIS — R600 Localized edema: Secondary | ICD-10-CM

## 2023-11-10 MED ORDER — HYDROCHLOROTHIAZIDE 12.5 MG PO TABS: 12.5000 mg | ORAL_TABLET | Freq: Every day | ORAL | 0 refills | Status: DC

## 2023-11-10 NOTE — Telephone Encounter (Signed)
 Pls contact the pt to schedule 6 month HTN follow-up with Dr. Augustus Ledger. Sending 30 day refill. Thx.

## 2023-11-10 NOTE — Telephone Encounter (Signed)
 Contacted patient he is now scheduled for an appt

## 2023-11-23 ENCOUNTER — Encounter: Payer: Self-pay | Admitting: Family Medicine

## 2023-11-23 ENCOUNTER — Ambulatory Visit: Admitting: Family Medicine

## 2023-11-23 VITALS — BP 136/77 | HR 57 | Ht 67.0 in | Wt 228.8 lb

## 2023-11-23 DIAGNOSIS — R600 Localized edema: Secondary | ICD-10-CM | POA: Diagnosis not present

## 2023-11-23 DIAGNOSIS — I10 Essential (primary) hypertension: Secondary | ICD-10-CM | POA: Diagnosis not present

## 2023-11-23 DIAGNOSIS — E781 Pure hyperglyceridemia: Secondary | ICD-10-CM

## 2023-11-23 DIAGNOSIS — Z125 Encounter for screening for malignant neoplasm of prostate: Secondary | ICD-10-CM | POA: Diagnosis not present

## 2023-11-23 MED ORDER — HYDROCHLOROTHIAZIDE 12.5 MG PO TABS: 12.5000 mg | ORAL_TABLET | Freq: Every day | ORAL | 3 refills | Status: AC

## 2023-11-23 MED ORDER — CETIRIZINE HCL 10 MG PO TABS: ORAL_TABLET | ORAL | 2 refills | Status: AC

## 2023-11-23 NOTE — Assessment & Plan Note (Signed)
 BP remains well controlled with HCTZ at current strength.  Will plan to continue.  Update labs today.

## 2023-11-23 NOTE — Assessment & Plan Note (Signed)
 Update lipid panel.

## 2023-11-23 NOTE — Progress Notes (Signed)
 Adrian [name] - 63 y.o. male MRN [id number]  Date of birth: [birth year]  Subjective Chief Complaint  Patient presents with   Hypertension    HPI Adrian [name] is a 63 y.o. male here today for follow up visit.   He reports that he is doing pretty well.  Continues on hydrochlorothiazide  for management of HTN.  BP is well controlled with current strength of medication.  He has had a couple of episodes of dizziness while working outdoors.  Denies pre-syncope or syncopal symptoms.  Denies chest pain, palpitations, headache or vision changes.   He notes that feet do feel cold at times.  He denies burning, numbness or tingling in the feet.   ROS:  A comprehensive ROS was completed and negative except as noted per HPI  No Known Allergies  Past Medical History:  Diagnosis Date   Chronic right hip pain 08/06/2017   Chronic right-sided low back pain without sciatica 08/06/2017   GERD (gastroesophageal reflux disease)    Hypertension    Hypertriglyceridemia 08/06/2017   Seasonal allergies     Past Surgical History:  Procedure Laterality Date   ESOPHAGOGASTRODUODENOSCOPY     NO PAST SURGERIES     SHOULDER ARTHROSCOPY WITH SUBACROMIAL DECOMPRESSION AND BICEP TENDON REPAIR Left 05/31/2020   Procedure: LEFT SHOULDER ARTHROSCOPY WITH DEBRIDEMENT, DISTAL CLAVICAL EXCISION,  SUBACROMIAL DECOMPRESSION, BICEPS TENODESIS;  Surgeon: Cristy Bonner DASEN, MD;  Location: [facility];  Service: Orthopedics;  Laterality: Left;    Social History   Socioeconomic History   Marital status: Married    Spouse name: Not on file   Number of children: Not on file   Years of education: Not on file   Highest education level: Associate degree: occupational, Scientist, product/process development, or vocational program  Occupational History   Not on file  Tobacco Use   Smoking status: Never   Smokeless tobacco: Never  Vaping Use   Vaping status: Never Used  Substance and Sexual Activity   Alcohol use: No   Drug use: No    Sexual activity: Yes    Birth control/protection: None  Other Topics Concern   Not on file  Social History Narrative   Not on file   Social Drivers of Health   Financial Resource Strain: Low Risk  (11/16/2023)   Overall Financial Resource Strain (CARDIA)    Difficulty of Paying Living Expenses: Not hard at all  Food Insecurity: No Food Insecurity (11/16/2023)   Hunger Vital Sign    Worried About Running Out of Food in the Last Year: Never true    Ran Out of Food in the Last Year: Never true  Transportation Needs: No Transportation Needs (11/16/2023)   PRAPARE - Administrator, Civil Service (Medical): No    Lack of Transportation (Non-Medical): No  Physical Activity: Insufficiently Active (11/16/2023)   Exercise Vital Sign    Days of Exercise per Week: 3 days    Minutes of Exercise per Session: 30 min  Stress: No Stress Concern Present (11/16/2023)   [employer] of Occupational Health - Occupational Stress Questionnaire    Feeling of Stress: Not at all  Social Connections: Socially Integrated (11/16/2023)   Social Connection and Isolation Panel    Frequency of Communication with Friends and Family: More than three times a week    Frequency of Social Gatherings with Friends and Family: More than three times a week    Attends Religious Services: More than 4 times per year    Active  Member of Clubs or Organizations: Yes    Attends Engineer, structural: More than 4 times per year    Marital Status: Married    Family History  Problem Relation Age of Onset   Dementia Mother    Stroke Father    COPD Father    Heart failure Father    Asthma Father    Asthma Sister    Heart attack Paternal Uncle    Stroke Paternal Uncle     Health Maintenance  Topic Date Due   COVID-19 Vaccine (4 - 2024-25 season) 03/31/2024 (Originally 01/25/2023)   INFLUENZA VACCINE  12/25/2023   DTaP/Tdap/Td (4 - Td or Tdap) 01/22/2033   Colonoscopy  06/18/2033   Hepatitis C  Screening  Completed   HIV Screening  Completed   Zoster Vaccines- Shingrix   Completed   Hepatitis B Vaccines  Aged Out   HPV VACCINES  Aged Out   Meningococcal B Vaccine  Aged Out     ----------------------------------------------------------------------------------------------------------------------------------------------------------------------------------------------------------------- Physical Exam BP 136/77 (BP Location: Left Arm, Patient Position: Sitting, Cuff Size: Large)   Pulse (!) 57   Ht 5' 7 (1.702 m)   Wt 228 lb 12.8 oz (103.8 kg)   SpO2 98%   BMI 35.84 kg/m   Physical Exam Constitutional:      Appearance: Normal appearance.  HENT:     Head: Normocephalic and atraumatic.   Cardiovascular:     Rate and Rhythm: Normal rate and regular rhythm.     Pulses: Normal pulses.  Pulmonary:     Effort: Pulmonary effort is normal.     Breath sounds: Normal breath sounds.   Musculoskeletal:     Cervical back: Neck supple.   Neurological:     General: No focal deficit present.     Mental Status: He is alert.   Psychiatric:        Mood and Affect: Mood normal.        Behavior: Behavior normal.     ------------------------------------------------------------------------------------------------------------------------------------------------------------------------------------------------------------------- Assessment and Plan  Hypertriglyceridemia Update lipid panel.   Hypertension goal BP (blood pressure) < 130/80 BP remains well controlled with HCTZ at current strength.  Will plan to continue.  Update labs today.    Meds ordered this encounter  Medications   cetirizine  (ZYRTEC  ALLERGY) 10 MG tablet    Sig: Take one tab PO once daily for allergy symptoms    Dispense:  30 tablet    Refill:  2   hydrochlorothiazide  (HYDRODIURIL ) 12.5 MG tablet    Sig: Take 1 tablet (12.5 mg total) by mouth daily.    Dispense:  90 tablet    Refill:  3    Return in  about 6 months (around 05/24/2024) for Hypertension.

## 2023-11-25 LAB — CBC WITH DIFFERENTIAL/PLATELET
Basophils Absolute: 0 10*3/uL (ref 0.0–0.2)
Basos: 1 %
EOS (ABSOLUTE): 0.1 10*3/uL (ref 0.0–0.4)
Eos: 1 %
Hematocrit: 44.2 % (ref 37.5–51.0)
Hemoglobin: 14.7 g/dL (ref 13.0–17.7)
Immature Grans (Abs): 0 10*3/uL (ref 0.0–0.1)
Immature Granulocytes: 0 %
Lymphocytes Absolute: 1.7 10*3/uL (ref 0.7–3.1)
Lymphs: 41 %
MCH: 30.2 pg (ref 26.6–33.0)
MCHC: 33.3 g/dL (ref 31.5–35.7)
MCV: 91 fL (ref 79–97)
Monocytes Absolute: 0.4 10*3/uL (ref 0.1–0.9)
Monocytes: 10 %
Neutrophils Absolute: 2 10*3/uL (ref 1.4–7.0)
Neutrophils: 47 %
Platelets: 190 10*3/uL (ref 150–450)
RBC: 4.86 x10E6/uL (ref 4.14–5.80)
RDW: 12.3 % (ref 11.6–15.4)
WBC: 4.1 10*3/uL (ref 3.4–10.8)

## 2023-11-25 LAB — CMP14+EGFR
ALT: 18 IU/L (ref 0–44)
AST: 16 IU/L (ref 0–40)
Albumin: 4.6 g/dL (ref 3.8–4.9)
Alkaline Phosphatase: 55 IU/L (ref 44–121)
BUN/Creatinine Ratio: 12 (ref 10–24)
BUN: 14 mg/dL (ref 8–27)
Bilirubin Total: 0.4 mg/dL (ref 0.0–1.2)
CO2: 21 mmol/L (ref 20–29)
Calcium: 9.3 mg/dL (ref 8.6–10.2)
Chloride: 99 mmol/L (ref 96–106)
Creatinine, Ser: 1.2 mg/dL (ref 0.76–1.27)
Globulin, Total: 2.2 g/dL (ref 1.5–4.5)
Glucose: 88 mg/dL (ref 70–99)
Potassium: 3.7 mmol/L (ref 3.5–5.2)
Sodium: 137 mmol/L (ref 134–144)
Total Protein: 6.8 g/dL (ref 6.0–8.5)
eGFR: 69 mL/min/{1.73_m2} (ref >59)

## 2023-11-25 LAB — TSH+FREE T4
Free T4: 1.55 ng/dL (ref 0.82–1.77)
TSH: 1.05 u[IU]/mL (ref 0.450–4.500)

## 2023-11-25 LAB — LIPID PANEL WITH LDL/HDL RATIO
Cholesterol, Total: 176 mg/dL (ref 100–199)
HDL: 42 mg/dL (ref >39)
LDL Chol Calc (NIH): 102 mg/dL — ABNORMAL HIGH (ref 0–99)
LDL/HDL Ratio: 2.4 ratio (ref 0.0–3.6)
Triglycerides: 184 mg/dL — ABNORMAL HIGH (ref 0–149)
VLDL Cholesterol Cal: 32 mg/dL (ref 5–40)

## 2023-11-25 LAB — PSA: Prostate Specific Ag, Serum: 1.7 ng/mL (ref 0.0–4.0)

## 2023-12-02 ENCOUNTER — Ambulatory Visit: Payer: Self-pay | Admitting: Family Medicine

## 2023-12-31 ENCOUNTER — Other Ambulatory Visit: Payer: Self-pay | Admitting: Family Medicine

## 2024-01-10 ENCOUNTER — Other Ambulatory Visit: Payer: Self-pay | Admitting: Family Medicine

## 2024-01-10 DIAGNOSIS — K219 Gastro-esophageal reflux disease without esophagitis: Secondary | ICD-10-CM

## 2024-01-26 ENCOUNTER — Encounter: Payer: Self-pay | Admitting: Sports Medicine

## 2024-01-27 DIAGNOSIS — H5213 Myopia, bilateral: Secondary | ICD-10-CM | POA: Diagnosis not present

## 2024-11-23 ENCOUNTER — Encounter: Admitting: Family Medicine
